# Patient Record
Sex: Female | Born: 1960
Health system: Southern US, Community
[De-identification: ages and names within clinical notes are randomized; demographics above are authoritative.]

## PROBLEM LIST (undated history)

## (undated) DIAGNOSIS — E785 Hyperlipidemia, unspecified: Secondary | ICD-10-CM

## (undated) DIAGNOSIS — T7840XA Allergy, unspecified, initial encounter: Secondary | ICD-10-CM

## (undated) DIAGNOSIS — Z833 Family history of diabetes mellitus: Secondary | ICD-10-CM

## (undated) DIAGNOSIS — R7989 Other specified abnormal findings of blood chemistry: Secondary | ICD-10-CM

## (undated) DIAGNOSIS — R011 Cardiac murmur, unspecified: Secondary | ICD-10-CM

## (undated) DIAGNOSIS — I10 Essential (primary) hypertension: Secondary | ICD-10-CM

## (undated) DIAGNOSIS — K219 Gastro-esophageal reflux disease without esophagitis: Secondary | ICD-10-CM

## (undated) DIAGNOSIS — K222 Esophageal obstruction: Secondary | ICD-10-CM

## (undated) DIAGNOSIS — M3500 Sicca syndrome, unspecified: Secondary | ICD-10-CM

## (undated) DIAGNOSIS — K589 Irritable bowel syndrome without diarrhea: Secondary | ICD-10-CM

## (undated) DIAGNOSIS — M1990 Unspecified osteoarthritis, unspecified site: Secondary | ICD-10-CM

## (undated) DIAGNOSIS — I7300 Raynaud's syndrome without gangrene: Secondary | ICD-10-CM

## (undated) DIAGNOSIS — F419 Anxiety disorder, unspecified: Secondary | ICD-10-CM

## (undated) DIAGNOSIS — R51 Headache: Secondary | ICD-10-CM

## (undated) DIAGNOSIS — C189 Malignant neoplasm of colon, unspecified: Secondary | ICD-10-CM

## (undated) DIAGNOSIS — E039 Hypothyroidism, unspecified: Secondary | ICD-10-CM

## (undated) DIAGNOSIS — N809 Endometriosis, unspecified: Secondary | ICD-10-CM

## (undated) HISTORY — PX: COLONOSCOPY: SHX174

## (undated) HISTORY — DX: Malignant neoplasm of colon, unspecified: C18.9

## (undated) HISTORY — PX: UPPER GASTROINTESTINAL ENDOSCOPY: SHX188

## (undated) HISTORY — DX: Irritable bowel syndrome, unspecified: K58.9

## (undated) HISTORY — DX: Essential (primary) hypertension: I10

## (undated) HISTORY — DX: Hypothyroidism, unspecified: E03.9

## (undated) HISTORY — DX: Family history of diabetes mellitus: Z83.3

## (undated) HISTORY — PX: HERNIA REPAIR: SHX51

## (undated) HISTORY — DX: Sjogren syndrome, unspecified: M35.00

## (undated) HISTORY — DX: Headache: R51

## (undated) HISTORY — DX: Endometriosis, unspecified: N80.9

## (undated) HISTORY — DX: Other specified abnormal findings of blood chemistry: R79.89

## (undated) HISTORY — PX: ABDOMINAL HYSTERECTOMY: SHX81

## (undated) HISTORY — PX: REDUCTION MAMMAPLASTY: SUR839

## (undated) HISTORY — PX: BREAST BIOPSY: SHX20

## (undated) HISTORY — PX: POLYPECTOMY: SHX149

## (undated) HISTORY — DX: Gastro-esophageal reflux disease without esophagitis: K21.9

## (undated) HISTORY — DX: Hyperlipidemia, unspecified: E78.5

## (undated) HISTORY — DX: Unspecified osteoarthritis, unspecified site: M19.90

## (undated) HISTORY — DX: Anxiety disorder, unspecified: F41.9

## (undated) HISTORY — DX: Allergy, unspecified, initial encounter: T78.40XA

## (undated) HISTORY — DX: Cardiac murmur, unspecified: R01.1

## (undated) HISTORY — DX: Raynaud's syndrome without gangrene: I73.00

## (undated) HISTORY — PX: BREAST REDUCTION SURGERY: SHX8

## (undated) HISTORY — DX: Esophageal obstruction: K22.2

---

## 1996-02-04 ENCOUNTER — Encounter: Payer: Self-pay | Admitting: Internal Medicine

## 1998-10-05 ENCOUNTER — Other Ambulatory Visit: Admission: RE | Admit: 1998-10-05 | Discharge: 1998-10-05 | Payer: Self-pay | Admitting: *Deleted

## 1998-10-15 ENCOUNTER — Ambulatory Visit (HOSPITAL_COMMUNITY): Admission: RE | Admit: 1998-10-15 | Discharge: 1998-10-15 | Payer: Self-pay | Admitting: Obstetrics and Gynecology

## 1998-10-15 ENCOUNTER — Encounter: Payer: Self-pay | Admitting: Obstetrics and Gynecology

## 1999-12-28 ENCOUNTER — Ambulatory Visit (HOSPITAL_BASED_OUTPATIENT_CLINIC_OR_DEPARTMENT_OTHER): Admission: RE | Admit: 1999-12-28 | Discharge: 1999-12-28 | Payer: Self-pay | Admitting: *Deleted

## 1999-12-28 ENCOUNTER — Encounter (INDEPENDENT_AMBULATORY_CARE_PROVIDER_SITE_OTHER): Payer: Self-pay | Admitting: *Deleted

## 2000-11-13 ENCOUNTER — Other Ambulatory Visit: Admission: RE | Admit: 2000-11-13 | Discharge: 2000-11-13 | Payer: Self-pay | Admitting: *Deleted

## 2001-05-16 ENCOUNTER — Encounter: Payer: Self-pay | Admitting: Internal Medicine

## 2001-05-16 ENCOUNTER — Ambulatory Visit (HOSPITAL_COMMUNITY): Admission: RE | Admit: 2001-05-16 | Discharge: 2001-05-16 | Payer: Self-pay | Admitting: Internal Medicine

## 2003-03-24 ENCOUNTER — Other Ambulatory Visit: Admission: RE | Admit: 2003-03-24 | Discharge: 2003-03-24 | Payer: Self-pay | Admitting: Obstetrics & Gynecology

## 2003-09-09 ENCOUNTER — Ambulatory Visit (HOSPITAL_COMMUNITY): Admission: RE | Admit: 2003-09-09 | Discharge: 2003-09-09 | Payer: Self-pay | Admitting: *Deleted

## 2003-09-09 ENCOUNTER — Encounter (INDEPENDENT_AMBULATORY_CARE_PROVIDER_SITE_OTHER): Payer: Self-pay | Admitting: Specialist

## 2003-09-09 ENCOUNTER — Ambulatory Visit (HOSPITAL_BASED_OUTPATIENT_CLINIC_OR_DEPARTMENT_OTHER): Admission: RE | Admit: 2003-09-09 | Discharge: 2003-09-09 | Payer: Self-pay | Admitting: *Deleted

## 2006-02-01 ENCOUNTER — Ambulatory Visit: Payer: Self-pay | Admitting: Internal Medicine

## 2006-02-09 ENCOUNTER — Ambulatory Visit: Payer: Self-pay | Admitting: Internal Medicine

## 2006-03-02 ENCOUNTER — Ambulatory Visit: Payer: Self-pay | Admitting: Internal Medicine

## 2006-05-17 ENCOUNTER — Ambulatory Visit: Payer: Self-pay | Admitting: Internal Medicine

## 2006-06-02 ENCOUNTER — Ambulatory Visit: Payer: Self-pay | Admitting: Internal Medicine

## 2006-07-04 ENCOUNTER — Ambulatory Visit: Payer: Self-pay | Admitting: Internal Medicine

## 2006-08-04 ENCOUNTER — Ambulatory Visit: Payer: Self-pay | Admitting: Internal Medicine

## 2006-11-27 ENCOUNTER — Ambulatory Visit: Payer: Self-pay | Admitting: Internal Medicine

## 2006-11-27 LAB — CONVERTED CEMR LAB
BUN: 4 mg/dL — ABNORMAL LOW (ref 6–23)
Basophils Absolute: 0 10*3/uL (ref 0.0–0.1)
Basophils Relative: 0 % (ref 0.0–1.0)
Creatinine, Ser: 0.7 mg/dL (ref 0.4–1.2)
Eosinophils Absolute: 0.1 10*3/uL (ref 0.0–0.6)
Eosinophils Relative: 2.6 % (ref 0.0–5.0)
HCT: 43.1 % (ref 36.0–46.0)
Hemoglobin: 14.6 g/dL (ref 12.0–15.0)
Lymphocytes Relative: 47 % — ABNORMAL HIGH (ref 12.0–46.0)
MCHC: 33.9 g/dL (ref 30.0–36.0)
MCV: 93 fL (ref 78.0–100.0)
Monocytes Absolute: 0.3 10*3/uL (ref 0.2–0.7)
Monocytes Relative: 8.2 % (ref 3.0–11.0)
Neutro Abs: 1.5 10*3/uL (ref 1.4–7.7)
Neutrophils Relative %: 42.2 % — ABNORMAL LOW (ref 43.0–77.0)
Platelets: 312 10*3/uL (ref 150–400)
Potassium: 3.9 meq/L (ref 3.5–5.1)
RBC: 4.63 M/uL (ref 3.87–5.11)
RDW: 12.4 % (ref 11.5–14.6)
WBC: 3.6 10*3/uL — ABNORMAL LOW (ref 4.5–10.5)

## 2006-11-28 ENCOUNTER — Encounter: Payer: Self-pay | Admitting: Internal Medicine

## 2006-11-28 LAB — CONVERTED CEMR LAB: Cyclic Citrullin Peptide Ab: 4 units (ref <20)

## 2007-02-19 ENCOUNTER — Ambulatory Visit: Payer: Self-pay | Admitting: Internal Medicine

## 2007-02-20 LAB — CONVERTED CEMR LAB
Free T4: 0.6 ng/dL (ref 0.6–1.6)
T3, Free: 3 pg/mL (ref 2.3–4.2)
TSH: 7.02 microintl units/mL — ABNORMAL HIGH (ref 0.35–5.50)

## 2007-03-14 ENCOUNTER — Ambulatory Visit: Payer: Self-pay | Admitting: Internal Medicine

## 2007-03-14 ENCOUNTER — Telehealth: Payer: Self-pay | Admitting: Internal Medicine

## 2007-03-30 ENCOUNTER — Ambulatory Visit: Payer: Self-pay | Admitting: Internal Medicine

## 2007-03-30 ENCOUNTER — Encounter: Payer: Self-pay | Admitting: Internal Medicine

## 2007-04-03 DIAGNOSIS — E039 Hypothyroidism, unspecified: Secondary | ICD-10-CM | POA: Insufficient documentation

## 2007-04-13 DIAGNOSIS — E785 Hyperlipidemia, unspecified: Secondary | ICD-10-CM | POA: Insufficient documentation

## 2007-04-13 DIAGNOSIS — R519 Headache, unspecified: Secondary | ICD-10-CM | POA: Insufficient documentation

## 2007-04-13 DIAGNOSIS — R51 Headache: Secondary | ICD-10-CM | POA: Insufficient documentation

## 2007-04-13 DIAGNOSIS — E7800 Pure hypercholesterolemia, unspecified: Secondary | ICD-10-CM | POA: Insufficient documentation

## 2007-04-13 DIAGNOSIS — I7300 Raynaud's syndrome without gangrene: Secondary | ICD-10-CM | POA: Insufficient documentation

## 2007-04-13 DIAGNOSIS — M797 Fibromyalgia: Secondary | ICD-10-CM | POA: Insufficient documentation

## 2007-04-13 DIAGNOSIS — M1990 Unspecified osteoarthritis, unspecified site: Secondary | ICD-10-CM | POA: Insufficient documentation

## 2007-04-16 ENCOUNTER — Ambulatory Visit: Payer: Self-pay | Admitting: Internal Medicine

## 2007-04-16 DIAGNOSIS — N951 Menopausal and female climacteric states: Secondary | ICD-10-CM | POA: Insufficient documentation

## 2007-04-16 DIAGNOSIS — S335XXA Sprain of ligaments of lumbar spine, initial encounter: Secondary | ICD-10-CM | POA: Insufficient documentation

## 2007-04-16 LAB — CONVERTED CEMR LAB: TSH: 0.19 microintl units/mL — ABNORMAL LOW (ref 0.35–5.50)

## 2007-05-24 ENCOUNTER — Encounter: Payer: Self-pay | Admitting: Internal Medicine

## 2007-06-11 ENCOUNTER — Telehealth: Payer: Self-pay | Admitting: Internal Medicine

## 2008-05-28 ENCOUNTER — Telehealth: Payer: Self-pay | Admitting: Internal Medicine

## 2008-10-25 ENCOUNTER — Encounter: Payer: Self-pay | Admitting: Internal Medicine

## 2008-10-30 ENCOUNTER — Encounter: Payer: Self-pay | Admitting: Internal Medicine

## 2008-10-31 ENCOUNTER — Encounter: Payer: Self-pay | Admitting: Internal Medicine

## 2008-12-22 ENCOUNTER — Ambulatory Visit: Payer: Self-pay | Admitting: Internal Medicine

## 2008-12-22 DIAGNOSIS — R635 Abnormal weight gain: Secondary | ICD-10-CM | POA: Insufficient documentation

## 2008-12-24 LAB — CONVERTED CEMR LAB
Free T4: 0.7 ng/dL (ref 0.6–1.6)
T3, Free: 2.9 pg/mL (ref 2.3–4.2)
TSH: 1.65 microintl units/mL (ref 0.35–5.50)

## 2008-12-26 ENCOUNTER — Telehealth (INDEPENDENT_AMBULATORY_CARE_PROVIDER_SITE_OTHER): Payer: Self-pay | Admitting: *Deleted

## 2009-02-06 ENCOUNTER — Telehealth: Payer: Self-pay | Admitting: Internal Medicine

## 2009-02-24 ENCOUNTER — Ambulatory Visit: Payer: Self-pay | Admitting: Internal Medicine

## 2009-02-24 DIAGNOSIS — D510 Vitamin B12 deficiency anemia due to intrinsic factor deficiency: Secondary | ICD-10-CM | POA: Insufficient documentation

## 2009-02-25 ENCOUNTER — Telehealth: Payer: Self-pay | Admitting: Internal Medicine

## 2009-04-24 ENCOUNTER — Ambulatory Visit: Payer: Self-pay | Admitting: Internal Medicine

## 2009-04-24 LAB — CONVERTED CEMR LAB
Cholesterol, target level: 200 mg/dL
HDL goal, serum: 40 mg/dL
LDL Goal: 160 mg/dL

## 2009-04-30 ENCOUNTER — Encounter: Payer: Self-pay | Admitting: Internal Medicine

## 2009-07-20 ENCOUNTER — Encounter: Payer: Self-pay | Admitting: Nurse Practitioner

## 2009-07-22 ENCOUNTER — Telehealth: Payer: Self-pay | Admitting: Internal Medicine

## 2009-07-23 ENCOUNTER — Ambulatory Visit: Payer: Self-pay | Admitting: Gastroenterology

## 2009-07-23 DIAGNOSIS — R109 Unspecified abdominal pain: Secondary | ICD-10-CM | POA: Insufficient documentation

## 2009-07-23 DIAGNOSIS — R1084 Generalized abdominal pain: Secondary | ICD-10-CM | POA: Insufficient documentation

## 2009-07-23 DIAGNOSIS — R198 Other specified symptoms and signs involving the digestive system and abdomen: Secondary | ICD-10-CM | POA: Insufficient documentation

## 2009-07-23 DIAGNOSIS — D709 Neutropenia, unspecified: Secondary | ICD-10-CM | POA: Insufficient documentation

## 2009-07-23 DIAGNOSIS — R110 Nausea: Secondary | ICD-10-CM | POA: Insufficient documentation

## 2009-07-23 DIAGNOSIS — R12 Heartburn: Secondary | ICD-10-CM | POA: Insufficient documentation

## 2009-07-27 LAB — CONVERTED CEMR LAB
Basophils Absolute: 0 10*3/uL (ref 0.0–0.1)
Lymphocytes Relative: 47.2 % — ABNORMAL HIGH (ref 12.0–46.0)
Monocytes Relative: 10.9 % (ref 3.0–12.0)
Neutrophils Relative %: 39.8 % — ABNORMAL LOW (ref 43.0–77.0)
Platelets: 263 10*3/uL (ref 150.0–400.0)
RDW: 12.4 % (ref 11.5–14.6)

## 2009-08-19 DIAGNOSIS — N809 Endometriosis, unspecified: Secondary | ICD-10-CM | POA: Insufficient documentation

## 2009-08-19 HISTORY — DX: Endometriosis, unspecified: N80.9

## 2009-08-24 ENCOUNTER — Ambulatory Visit (HOSPITAL_COMMUNITY): Admission: RE | Admit: 2009-08-24 | Discharge: 2009-08-24 | Payer: Self-pay | Admitting: Internal Medicine

## 2009-08-24 ENCOUNTER — Ambulatory Visit: Payer: Self-pay | Admitting: Internal Medicine

## 2009-08-24 DIAGNOSIS — R1013 Epigastric pain: Secondary | ICD-10-CM

## 2009-08-24 DIAGNOSIS — K3189 Other diseases of stomach and duodenum: Secondary | ICD-10-CM | POA: Insufficient documentation

## 2009-09-09 ENCOUNTER — Encounter (INDEPENDENT_AMBULATORY_CARE_PROVIDER_SITE_OTHER): Payer: Self-pay | Admitting: *Deleted

## 2009-10-08 ENCOUNTER — Telehealth: Payer: Self-pay | Admitting: Internal Medicine

## 2009-10-13 ENCOUNTER — Telehealth: Payer: Self-pay | Admitting: Internal Medicine

## 2009-10-20 ENCOUNTER — Ambulatory Visit: Payer: Self-pay | Admitting: Internal Medicine

## 2009-10-26 ENCOUNTER — Telehealth: Payer: Self-pay | Admitting: Internal Medicine

## 2009-11-03 ENCOUNTER — Ambulatory Visit: Payer: Self-pay | Admitting: Internal Medicine

## 2009-11-04 ENCOUNTER — Encounter: Payer: Self-pay | Admitting: Internal Medicine

## 2009-11-05 ENCOUNTER — Encounter: Payer: Self-pay | Admitting: Internal Medicine

## 2009-12-04 ENCOUNTER — Telehealth: Payer: Self-pay | Admitting: Internal Medicine

## 2010-01-18 ENCOUNTER — Ambulatory Visit: Payer: Self-pay | Admitting: Internal Medicine

## 2010-01-18 DIAGNOSIS — R030 Elevated blood-pressure reading, without diagnosis of hypertension: Secondary | ICD-10-CM | POA: Insufficient documentation

## 2010-03-31 ENCOUNTER — Telehealth: Payer: Self-pay | Admitting: Internal Medicine

## 2010-04-02 ENCOUNTER — Telehealth: Payer: Self-pay | Admitting: Internal Medicine

## 2010-04-14 ENCOUNTER — Telehealth: Payer: Self-pay | Admitting: Internal Medicine

## 2010-06-03 ENCOUNTER — Ambulatory Visit: Payer: Self-pay | Admitting: Internal Medicine

## 2010-08-25 ENCOUNTER — Ambulatory Visit: Payer: Self-pay | Admitting: Internal Medicine

## 2010-08-25 LAB — CONVERTED CEMR LAB
ALT: 17 units/L (ref 0–35)
AST: 18 units/L (ref 0–37)
Albumin: 3.6 g/dL (ref 3.5–5.2)
Alkaline Phosphatase: 75 units/L (ref 39–117)
Basophils Absolute: 0 10*3/uL (ref 0.0–0.1)
Basophils Relative: 0.4 % (ref 0.0–3.0)
Calcium: 9 mg/dL (ref 8.4–10.5)
Eosinophils Relative: 1.1 % (ref 0.0–5.0)
GFR calc non Af Amer: 93.14 mL/min (ref >60.00)
HDL: 44.8 mg/dL (ref >39.00)
Hemoglobin: 14.1 g/dL (ref 12.0–15.0)
Ketones, ur: NEGATIVE mg/dL
Lymphocytes Relative: 41.3 % (ref 12.0–46.0)
Monocytes Relative: 9.7 % (ref 3.0–12.0)
Neutro Abs: 1.8 10*3/uL (ref 1.4–7.7)
RBC: 4.41 M/uL (ref 3.87–5.11)
Sodium: 140 meq/L (ref 135–145)
Specific Gravity, Urine: 1.025 (ref 1.000–1.030)
Total CHOL/HDL Ratio: 4
Total Protein: 7.2 g/dL (ref 6.0–8.3)
Urine Glucose: NEGATIVE mg/dL
Urobilinogen, UA: 0.2 (ref 0.0–1.0)
VLDL: 14.6 mg/dL (ref 0.0–40.0)
WBC: 3.8 10*3/uL — ABNORMAL LOW (ref 4.5–10.5)

## 2010-08-30 ENCOUNTER — Ambulatory Visit: Payer: Self-pay | Admitting: Internal Medicine

## 2010-09-17 ENCOUNTER — Telehealth: Payer: Self-pay | Admitting: Internal Medicine

## 2010-09-29 ENCOUNTER — Ambulatory Visit
Admission: RE | Admit: 2010-09-29 | Discharge: 2010-09-29 | Payer: Self-pay | Source: Home / Self Care | Attending: Internal Medicine | Admitting: Internal Medicine

## 2010-09-29 DIAGNOSIS — K8020 Calculus of gallbladder without cholecystitis without obstruction: Secondary | ICD-10-CM | POA: Insufficient documentation

## 2010-09-29 DIAGNOSIS — K222 Esophageal obstruction: Secondary | ICD-10-CM | POA: Insufficient documentation

## 2010-09-29 DIAGNOSIS — R197 Diarrhea, unspecified: Secondary | ICD-10-CM | POA: Insufficient documentation

## 2010-09-29 DIAGNOSIS — F411 Generalized anxiety disorder: Secondary | ICD-10-CM | POA: Insufficient documentation

## 2010-10-12 NOTE — Progress Notes (Signed)
Summary: requesting preventative rx  Phone Note Call from Patient Call back at Home Phone [phone]   Caller: Patient-   voicemail Reason for Call: Acute Illness Summary of Call: Had 5 migraines in the last 2 weeks. On Imitrex. She is requesting a preventative rx from getting these weather related migraines. her pharmacy is  [facility] [address]. Initial call taken by: Warnell Forester,  October 08, 2009 9:48 AM  Follow-up for Phone Call        per dr Lovell Sheehan- needs ov- left message on machine for pt to call and make ov Follow-up by: Willy Eddy, LPN,  October 08, 2009 9:59 AM

## 2010-10-12 NOTE — Procedures (Signed)
Summary: Upper Endoscopy  Patient: [name] Note: All result statuses are Final unless otherwise noted.  Tests: (1) Upper Endoscopy (EGD)   EGD Upper Endoscopy       DONE     [facility]     [address]     [place], [place]  [address]           ENDOSCOPY PROCEDURE REPORT           PATIENT:  Margaret Green, Margaret Green  MR#:  [id number]     BIRTHDATE:  1961/06/23, 48 yrs. old  GENDER:  female           ENDOSCOPIST:  Hedwig Morton. Juanda Chance, MD     Referred by:  Stacie Glaze, M.D.           PROCEDURE DATE:  11/03/2009     PROCEDURE:  EGD with biopsy     ASA CLASS:  Class I     INDICATIONS:  abdominal pain           MEDICATIONS:   Versed 8 mg, Fentanyl 50 mcg     TOPICAL ANESTHETIC:  Exactacain Spray           DESCRIPTION OF PROCEDURE:   After the risks benefits and     alternatives of the procedure were thoroughly explained, informed     consent was obtained.  The [facility] [id number] endoscope was     introduced through the mouth and advanced to the second portion of     the duodenum, without limitations.  The instrument was slowly     withdrawn as the mucosa was fully examined.     <<PROCEDUREIMAGES>>           Duodenitis was found. r/o H.pylori, Multiple biopsies were     obtained and sent to pathology (see image4 and image5).  Otherwise     the examination was normal (see image8, image7, image6, and     image1).    Retroflexed views revealed no abnormalities.    The     scope was then withdrawn from the patient and the procedure     completed.           COMPLICATIONS:  None           ENDOSCOPIC IMPRESSION:     1) Duodenitis     2) Otherwise normal examination     small duodenal ulceration,/erosion, r/o H.pylori     RECOMMENDATIONS:     1) await pathology results     cont Dexilant 60 mg po qd     minimize Diclofenac           REPEAT EXAM:  In 0 year(s) for.           ______________________________     Hedwig Morton. Juanda Chance, MD           CC:           n.  eSIGNED:   Hedwig Morton. Brodie at 11/03/2009 03:33 PM           Mikea, Quadros, [id number]  Note: An exclamation mark (!) indicates a result that was not dispersed into the flowsheet. Document Creation Date: 11/03/2009 3:34 PM _______________________________________________________________________  (1) Order result status: Final Collection or observation date-time: 11/03/2009 15:25 Requested date-time:  Receipt date-time:  Reported date-time:  Referring Physician:   Ordering Physician: Lina Sar [phone]) Specimen Source:  Source: Launa Grill Order Number: [id number] Lab site:

## 2010-10-12 NOTE — Progress Notes (Signed)
Summary: samples  Medications Added ACIPHEX 20 MG TBEC (RABEPRAZOLE SODIUM) Take 1 tablet by mouth once a day DEXILANT 60 MG CPDR (DEXLANSOPRAZOLE) Take 1 tablet by mouth once a day       Phone Note Call from Patient Call back at Home Phone [phone]   Caller: Patient Call For: Juanda Chance Reason for Call: Talk to Nurse Summary of Call: Patient would like samples of Dexilant, states that Nexium did not work and Aciphex did work but before they pay for Aciphex they want her to try Dexilant Initial call taken by: Tawni Levy,  October 13, 2009 4:47 PM  Follow-up for Phone Call        Patient states that aciphex worked quite well for her but her insurance will not cover the rx until she has tried Nexium AND dexilant. She has tried nexium without results but has not tried dexilant. She requested samples. I have advised her that I will give her samples, however, she MUST reschedule her endoscopy and KEEP the appointment for any further samples or refills (she has cancelled EGD x 2). Patient has rescheduled and verbalizes understanding that we will not give anymore samples or rx's until she has her procedure. Follow-up by: Hortense Ramal CMA Duncan Dull),  October 14, 2009 8:58 AM    New/Updated Medications: ACIPHEX 20 MG TBEC (RABEPRAZOLE SODIUM) Take 1 tablet by mouth once a day DEXILANT 60 MG CPDR (DEXLANSOPRAZOLE) Take 1 tablet by mouth once a day

## 2010-10-12 NOTE — Progress Notes (Signed)
Summary: rx topamax  Phone Note Call from Patient   Caller: Patient Call For: Stacie Glaze MD Reason for Call: Talk to Nurse Summary of Call: having migraines 2x wkly - had discussed topamax in the past - would like to try if ok'd by Dr. Lovell Sheehan - has appt end of aug.  if ok'd - [facility] [address] for rx. can be reached at [phone] Initial call taken by: Duard Brady LPN,  April 02, 2010 1:08 PM  Follow-up for Phone Call        per dr [email or web address] may start topamax 25 for 4 days ,then 50 for 8 days then 75 and then make ov to see dr Lovell Sheehan are being on topamax 75 for 2 weeks Follow-up by: Willy Eddy, LPN,  April 02, 2010 1:43 PM    New/Updated Medications: TOPAMAX 25 MG TABS (TOPIRAMATE) 1 for 4 days then topamax 50 for 8 days then topamax 75 once daily TOPAMAX 50 MG TABS (TOPIRAMATE) 1 once daily for 8 days then 1 25 and 1 50(total of 75 ) once daily Prescriptions: TOPAMAX 50 MG TABS (TOPIRAMATE) 1 once daily for 8 days then 1 25 and 1 50(total of 75 ) once daily  #38 x 1   Entered by:   Willy Eddy, LPN   Authorized by:   Stacie Glaze MD   Signed by:   Willy Eddy, LPN on 11/91/4782   Method used:   Electronically to        [facility]. [phone]* (retail)       [address]       [place], [place]  [address]       Ph: [phone]       Fax: [phone]   RxID:   [id number] TOPAMAX 25 MG TABS (TOPIRAMATE) 1 for 4 days then topamax 50 for 8 days then topamax 75 once daily  #34 x 1   Entered by:   Willy Eddy, LPN   Authorized by:   Stacie Glaze MD   Signed by:   Willy Eddy, LPN on 95/63/8756   Method used:   Electronically to        [facility]. [phone]* (retail)       [address]       [place], [place]  [address]       Ph: [phone]       Fax: [phone]   RxID:   [id number]

## 2010-10-12 NOTE — Progress Notes (Signed)
Summary: congested  Phone Note Call from Patient   Caller: Patient Call For: Stacie Glaze MD Summary of Call: C/o head congestion, headache and cough. Finished Zpak. Can she have a decongestant? [facility] . Initial call taken by: Raechel Ache, RN,  October 26, 2009 12:10 PM  Follow-up for Phone Call        may  have zyrtec d 1 once daily per dr Lovell Sheehan  Follow-up by: Willy Eddy, LPN,  October 26, 2009 2:00 PM    New/Updated Medications: ZYRTEC-D ALLERGY & CONGESTION 5-120 MG XR12H-TAB (CETIRIZINE-PSEUDOEPHEDRINE) 1 q am for congestion Prescriptions: ZYRTEC-D ALLERGY & CONGESTION 5-120 MG XR12H-TAB (CETIRIZINE-PSEUDOEPHEDRINE) 1 q am for congestion  #30 x 0   Entered by:   Raechel Ache, RN   Authorized by:   Stacie Glaze MD   Signed by:   Raechel Ache, RN on 10/26/2009   Method used:   Electronically to        [facility]. [phone]* (retail)       [address]       [place], [place]  [address]       Ph: [phone]       Fax: [phone]   RxID:   [id number]

## 2010-10-12 NOTE — Progress Notes (Signed)
Summary: requesting alternate rx  Phone Note Call from Patient Call back at Home Phone [phone]   Caller: Patient--live call Summary of Call: Was put on Topamax. Makes her sick. would like to try alternate. Please call [facility] [address]. Initial call taken by: Warnell Forester,  April 14, 2010 10:46 AM  Follow-up for Phone Call        stop the topamax and may try verapamil 40 three times a day and call back with report of how she after she has been on it for 2-3 weeks per dr Lovell Sheehan    New/Updated Medications: VERAPAMIL HCL 40 MG TABS (VERAPAMIL HCL) One by mouth tid Prescriptions: VERAPAMIL HCL 40 MG TABS (VERAPAMIL HCL) One by mouth tid  #90 x 5   Entered by:   Rudy Jew, RN   Authorized by:   Stacie Glaze MD   Signed by:   Rudy Jew, RN on 04/14/2010   Method used:   Electronically to        [facility]. [phone]* (retail)       [address]       [place], [place]  [address]       Ph: [phone]       Fax: [phone]   RxID:   [id number]

## 2010-10-12 NOTE — Progress Notes (Signed)
Summary: labs & suvella concern  Phone Note Call from Patient Call back at Home Phone [phone] Call back at Work Phone [phone]   Caller: vm Summary of Call: Requesting copy of labs for health fair comparison.  Also Suvella working great, but am experiencing BP problems 138/90 & 140/92.  Never had before.  Continue or stop? Initial call taken by: Rudy Jew, RN,  December 04, 2009 11:17 AM  Follow-up for Phone Call        left message on machine per dr Lovell Sheehan- not suvella- dr Lovell Sheehan suggests loosing weight would help with bp Follow-up by: Willy Eddy, LPN,  December 04, 2009 12:46 PM

## 2010-10-12 NOTE — Letter (Signed)
Summary: Patient Notice-Endo Biopsy Results  [facility]  [address] [place], [place] [address]   Phone: [phone]  Fax: [phone]        November 05, 2009 MRN: [id number]    [facility] [address] [place], [place]  [address]    Dear Margaret Green,  I am pleased to inform you that the biopsies taken during your recent endoscopic examination did not show any evidence of cancer upon pathologic examination.The biopsies from the duodenum show inflammation consistent with duodenitis  Additional information/recommendations:  __No further action is needed at this time.  Please follow-up with      your primary care physician for your other healthcare needs.  __ Please call [phone] to schedule a return visit to review      your condition.  _x_ Continue with the treatment plan as outlined on the day of your      exam.  __ You should have a repeat endoscopic examination for this problem              in _ months/years.   Please call us if you are having persistent problems or have questions about your condition that have not been fully answered at this time.  Sincerely,  Hart Carwin MD  This letter has been electronically signed by your physician.  Appended Document: Patient Notice-Endo Biopsy Results Letter mailed 2.25.11

## 2010-10-12 NOTE — Miscellaneous (Signed)
Summary: Aciphex Rx  Pharmacy will put prescription through and will send [place] a request for prior authorization if needed. Hortense Ramal CMA Duncan Dull)  November 03, 2009 4:08 PM  Clinical Lists Changes  Medications: Changed medication from DEXILANT 60 MG CPDR (DEXLANSOPRAZOLE) Take 1 tablet by mouth once a day to ACIPHEX 20 MG TBEC (RABEPRAZOLE SODIUM) Take 1 tablet by mouth once daily - Signed Rx of ACIPHEX 20 MG TBEC (RABEPRAZOLE SODIUM) Take 1 tablet by mouth once daily;  #30 x 5;  Signed;  Entered by: Hortense Ramal CMA (AAMA);  Authorized by: Hart Carwin MD;  Method used: Telephoned to [facility]. [id number]*, [address], [place], [place], [place]  [address], Ph: [phone], Fax: [phone]    Prescriptions: ACIPHEX 20 MG TBEC (RABEPRAZOLE SODIUM) Take 1 tablet by mouth once daily  #30 x 5   Entered by:   Hortense Ramal CMA (AAMA)   Authorized by:   Hart Carwin MD   Signed by:   Hortense Ramal CMA (AAMA) on 11/03/2009   Method used:   Telephoned to ...       [facility]  [address] [phone]* (retail)       [address]       [place], [place]  [address]       Ph: [phone]       Fax: [phone]   RxID:   [id number]

## 2010-10-12 NOTE — Medication Information (Signed)
Summary: Approved/[employer]  Approved/[employer]IG[employer]   Imported By: Lester  11/09/2009 09:53:31  _____________________________________________________________________  External Attachment:    Type:   Image     Comment:   External Document

## 2010-10-12 NOTE — Assessment & Plan Note (Signed)
Summary: 2 month rov/njr/pt rescd//ccm/pt rsc/cjr   Vital Signs:  Patient profile:   50 year old female Height:      65 inches Weight:      156 pounds BMI:     26.05 Temp:     98.2 degrees F oral Pulse rate:   80 / minute Resp:     14 per minute BP sitting:   130 / 80  (left arm)  Vitals Entered By: Willy Eddy, LPN (Jan 18, 1609 4:38 PM) CC: roa, Lipid Management   Primary Care Provider:  Darryll Capers, MD  CC:  roa and Lipid Management.  History of Present Illness: blood pressure is good mood is stable head ache patter is stable and has only had two since she went on the savella fibromyalgia and insomnia responsive to the medication  noted elevated bod pressure at a health fair at work and has montered at home the blood preesu was slightly p at home but in the office the blood pressure is normal  Lipid Management History:      Negative NCEP/ATP III risk factors include female age less than 69 years old, no history of early menopause without estrogen hormone replacement, and non-tobacco-user status.     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) 10)  Weight Gain, Abnormal  (ICD-783.1) 11)  Sprain/strain, Lumbar Region  (ICD-847.2) 12)  Menopause  (ICD-627.2) 13)  Fibromyalgia  (ICD-729.1) 14)  Raynaud's Disease  (ICD-443.0) 15)  Headache  (ICD-784.0) 16)  Osteoarthritis  (ICD-715.90) 17)  Hyperlipidemia  (ICD-272.4) 18)  Family History Diabetes 1st Degree Relative  (ICD-V18.0) 19)  Hypothyroidism  (ICD-244.9)  Current Problems (verified): 1)  Dyspepsia   (ICD-536.8) 2)  Hx of Endometriosis  (ICD-617.9) 3)  Heartburn  (ICD-787.1) 4)  Neutropenia Unspecified  (ICD-288.00) 5)  Nausea  (ICD-787.02) 6)  Abdominal Pain-multiple Sites  (ICD-789.09) 7)  Abdominal Pain, Generalized  (ICD-789.07) 8)  Change in Bowels  (ICD-787.99) 9)  Anemia, 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 Take 1 Tablet By Mouth Once A Day 2)  Xanax 0.25 Mg Tabs (Alprazolam) .... One By Mouth Three Times A Day Prn 3)  Flonase 50 Mcg/act  Susp (Fluticasone Propionate) .... As Directed 4)  Calcium 500/d 500-400 Mg-Unit  Chew (Calcium-Vitamin D) .... 3 Tabs A Day 5)  Imitrex 50 Mg  Tabs (Sumatriptan Succinate) .... As Needed 6)  Estrace 1 Mg  Tabs (Estradiol) .... Once Daily 7)  Zanaflex 4 Mg  Tabs (Tizanidine Hcl) .... One By Mouth Two Times A Day As Needed. 8)  Lipitor 20 Mg Tab (Atorvastatin Calcium) .... Take 1 Tablet By Mouth Each Evening 9)  Butalbital-Apap-Caffeine 50-325-40 Mg  Caps (Butalbital-Apap-Caffeine) .Marland KitchenMarland KitchenMarland Kitchen  1 Q 4 Hrs As Needed 10)  Sumatriptan Succinate 100 Mg  Tabs (Sumatriptan Succinate) .... As Needed 11)  Zolpidem Tartrate 10 Mg Tabs (Zolpidem Tartrate) .Marland Kitchen.. 1 At Bedtime As Needed 12)  Promethazine Hcl 25 Mg Tabs (Promethazine Hcl) .Marland Kitchen.. 1 Every 6-8 Hours As Needed Nausea 13)  Valtrex 1 Gm Tabs (Valacyclovir Hcl) .... Take 1 Tablet By Mouth Three Times A Day X 7 Days 14)  Aciphex 20 Mg Tbec (Rabeprazole Sodium) .... Take 1 Tablet By Mouth Once Daily 15)  Savella 50 Mg Tabs (Milnacipran Hcl) .... One By Mouth Two Times A  Day 16)  Azithromycin 250 Mg Tabs (Azithromycin) .... Two By Mouth Now Then One By Mouth Daily For 4 Days 17)  Zyrtec-D Allergy & Congestion 5-120 Mg Xr12h-Tab (Cetirizine-Pseudoephedrine) .Marland Kitchen.. 1 Q Am For Congestion  Current Medications (verified): 1)  Synthroid 100 Mcg Tabs (Levothyroxine Sodium) .... Take 1 Tablet By Mouth Once A Day 2)  Xanax 0.25 Mg Tabs (Alprazolam) .... One By Mouth Three Times A Day Prn 3)  Flonase 50 Mcg/act  Susp (Fluticasone Propionate) .... As Directed 4)  Calcium 500/d 500-400 Mg-Unit  Chew (Calcium-Vitamin D) .... 3 Tabs A Day 5)  Imitrex 50 Mg  Tabs (Sumatriptan Succinate) .... As Needed 6)  Zanaflex 4 Mg  Tabs (Tizanidine Hcl) .... One By Mouth Two Times A Day As Needed. 7)  Lipitor 20 Mg Tab (Atorvastatin Calcium) .... Take 1 Tablet By Mouth Each Evening 8)  Butalbital-Apap-Caffeine 50-325-40 Mg  Caps (Butalbital-Apap-Caffeine) .Marland Kitchen.. 1 Q 4 Hrs As Needed 9)  Sumatriptan Succinate 100 Mg  Tabs (Sumatriptan Succinate) .... As Needed 10)  Zolpidem Tartrate 10 Mg Tabs (Zolpidem Tartrate) .Marland Kitchen.. 1 At Bedtime As Needed 11)  Promethazine Hcl 25 Mg Tabs (Promethazine Hcl) .Marland Kitchen.. 1 Every 6-8 Hours As Needed Nausea 12)  Aciphex 20 Mg Tbec (Rabeprazole Sodium) .... Take 1 Tablet By Mouth Once Daily 13)  Savella 50 Mg Tabs (Milnacipran Hcl) .... One By Mouth Two Times A Day 14)  Zyrtec-D Allergy & Congestion 5-120 Mg Xr12h-Tab (Cetirizine-Pseudoephedrine) .Marland Kitchen.. 1 Q Am For Congestion 15)  Natural Estrogen Cream  Allergies (verified): 1)  ! Codeine 2)  Mobic (Meloxicam) 3)  Percodan (Oxycodone-Aspirin) 4)  Sulfamethoxazole (Sulfamethoxazole)  Past History:  Family History: Last updated: 08/24/2009 Family History Diabetes 1st degree relative Fam hx CAD Fam hx MI Family History of Ovarian Cancer:Aunt Family History of Stomach Cancer:Aunt Family History of Heart Disease: Father  Family History of Cirrhosis: Mother No FH of Colon Cancer:  Social History: Last updated:  08/19/2009 Never Smoked Alcohol use-yes-occasional Drug use-no Occupation: Sales Daily Caffeine Use Illicit Drug Use - no  Risk Factors: Smoking Status: never (01/18/2010)  Past medical, surgical, family and social histories (including risk factors) reviewed, and no changes noted (except as noted below).  Past Medical History: Reviewed history from 07/23/2009 and no changes required. Hypothyroidism Hyperlipidemia Osteoarthritis Headache 443.0 RAYNAUDS DX 729.1 FIBROMYALGIA Anxiety Disorder Esophageal Stricture  Past Surgical History: Reviewed history from 08/19/2009 and no changes required. TAH + O Elevated lipis Hernia Surgery Breast Biopsy-left breast Laparoscopy for endometriosis  Family History: Reviewed history from 08/24/2009 and no changes required. Family History Diabetes 1st degree relative Fam hx CAD Fam hx MI Family History of Ovarian Cancer:Aunt Family History of Stomach Cancer:Aunt Family History of Heart Disease: Father  Family History of Cirrhosis: Mother No FH of Colon Cancer:  Social History: Reviewed history from 08/19/2009 and no changes required. Never Smoked Alcohol use-yes-occasional Drug use-no Occupation:  Sales Daily Caffeine Use Illicit Drug Use - no  Review of Systems  The patient denies anorexia, fever, weight loss, weight gain, vision loss, decreased hearing, hoarseness, chest pain, syncope, dyspnea on exertion, peripheral edema, prolonged cough, headaches, hemoptysis, abdominal pain, melena, hematochezia, severe indigestion/heartburn, hematuria, incontinence, genital sores, muscle weakness, suspicious skin lesions, transient blindness, difficulty walking, depression, unusual weight change, abnormal bleeding, enlarged lymph nodes, angioedema, and breast masses.    Physical Exam  General:  alert and overweight-appearing.   Head:  normocephalic.  normocephalic.   Nose:  nasal dischargemucosal pallor, mucosal erythema, and mucosal  edema.   Mouth:  pharynx pink and moist.   Neck:  supple.   Lungs:  normal respiratory effort, no intercostal retractions, no accessory muscle use, and normal breath sounds.   Heart:  normal rate, regular rhythm, and no murmur.   Abdomen:  soft, no distention, and no masses.   Msk:  No deformity or scoliosis noted of thoracic or lumbar spine.   Extremities:  trace left pedal edema and trace right pedal edema.   Neurologic:  No cranial nerve deficits noted. Station and gait are normal. Plantar reflexes are down-going bilaterally. DTRs are symmetrical throughout. Sensory, motor and coordinative functions appear intact.   Impression & Recommendations:  Problem # 1:  FIBROMYALGIA (ICD-729.1)  Her updated medication list for this problem includes:    Zanaflex 4 Mg Tabs (Tizanidine hcl) ..... One by mouth two times a day as needed.    Butalbital-apap-caffeine 50-325-40 Mg Caps (Butalbital-apap-caffeine) .Marland Kitchen... 1 q 4 hrs as needed  Problem # 2:  HEADACHE (ICD-784.0) better Her updated medication list for this problem includes:    Imitrex 50 Mg Tabs (Sumatriptan succinate) .Marland Kitchen... As needed    Butalbital-apap-caffeine 50-325-40 Mg Caps (Butalbital-apap-caffeine) .Marland Kitchen... 1 q 4 hrs as needed    Sumatriptan Succinate 100 Mg Tabs (Sumatriptan succinate) .Marland Kitchen... As needed  Problem # 3:  ELEVATED BLOOD PRESSURE WITHOUT DIAGNOSIS OF HYPERTENSION (ICD-796.2) moniter and weight loss  Complete Medication List: 1)  Synthroid 100 Mcg Tabs (Levothyroxine sodium) .... Take 1 tablet by mouth once a day 2)  Xanax 0.25 Mg Tabs (Alprazolam) .... One by mouth three times a day prn 3)  Flonase 50 Mcg/act Susp (Fluticasone propionate) .... As directed 4)  Calcium 500/d 500-400 Mg-unit Chew (Calcium-vitamin d) .... 3 tabs a day 5)  Imitrex 50 Mg Tabs (Sumatriptan succinate) .... As needed 6)  Zanaflex 4 Mg Tabs (Tizanidine hcl) .... One by mouth two times a day as needed. 7)  Lipitor 20 Mg Tab (Atorvastatin calcium)  .... Take 1 tablet by mouth each evening 8)  Butalbital-apap-caffeine 50-325-40 Mg Caps (Butalbital-apap-caffeine) .Marland Kitchen.. 1 q 4 hrs as needed 9)  Sumatriptan Succinate 100 Mg Tabs (Sumatriptan succinate) .... As needed 10)  Zolpidem Tartrate 10 Mg Tabs (Zolpidem tartrate) .Marland Kitchen.. 1 at bedtime as needed 11)  Promethazine Hcl 25 Mg Tabs (Promethazine hcl) .Marland Kitchen.. 1 every 6-8 hours as needed nausea 12)  Aciphex 20 Mg Tbec (Rabeprazole sodium) .... Take 1 tablet by mouth once daily 13)  Savella 50 Mg Tabs (Milnacipran hcl) .... One by mouth two times a day 14)  Zyrtec-d Allergy & Congestion 5-120 Mg Xr12h-tab (Cetirizine-pseudoephedrine) .Marland Kitchen.. 1 q am for congestion 15)  Natural Estrogen Cream   Other Orders: Vit B12 1000 mcg (J3420) Admin of Therapeutic Inj  intramuscular or subcutaneous (47829)  Lipid Assessment/Plan:      Based on NCEP/ATP III, the patient's risk factor category is "0-1 risk factors".  The  patient's lipid goals are as follows: Total cholesterol goal is 200; LDL cholesterol goal is 160; HDL cholesterol goal is 40; Triglyceride goal is 150.    Patient Instructions: 1)  Please schedule a follow-up appointment in 3 months.   Medication Administration  Injection # 1:    Medication: Vit B12 1000 mcg    Diagnosis: ANEMIA, PERNICIOUS (ICD-281.0)    Site: L deltoid    Exp Date: 06/11/2011    Lot #: [id number]    Mfr: [employer]    Patient tolerated injection without complications    Given by: Willy Eddy, LPN (Jan 18, 1609 4:42 PM)  Orders Added: 1)  Vit B12 1000 mcg [J3420] 2)  Admin of Therapeutic Inj  intramuscular or subcutaneous [96372] 3)  Est. Patient Level V [96045]

## 2010-10-12 NOTE — Assessment & Plan Note (Signed)
Summary: 3 month rov/njr rsc bmp/njr/pt rescd//ccm pt rsc/njr   Vital Signs:  Patient profile:   50 year old female Height:      65 inches Weight:      154 pounds BMI:     25.72 Temp:     98.6 degrees F oral Pulse rate:   76 / minute Resp:     14 per minute BP sitting:   130 / 80  (left arm)  Vitals Entered By: Willy Eddy, LPN (June 03, 2010 9:25 AM) CC: roa, Lipid Management Is Patient Diabetic? No   CC:  roa and Lipid Management.  Lipid Management History:      Negative NCEP/ATP III risk factors include female age less than 34 years old, no history of early menopause without estrogen hormone replacement, non-tobacco-user status, non-hypertensive, no ASHD (atherosclerotic heart disease), no prior stroke/TIA, no peripheral vascular disease, and no history of aortic aneurysm.     Preventive Screening-Counseling & Management  Alcohol-Tobacco     Smoking Status: never  Current Problems (verified): 1)  Elevated Blood Pressure Without Diagnosis of Hypertension  (ICD-796.2) 2)  Dyspepsia  (ICD-536.8) 3)  Hx of Endometriosis  (ICD-617.9) 4)  Heartburn  (ICD-787.1) 5)  Neutropenia Unspecified  (ICD-288.00) 6)  Nausea  (ICD-787.02) 7)  Abdominal Pain-multiple Sites  (ICD-789.09) 8)  Abdominal Pain, Generalized  (ICD-789.07) 9)  Change in Bowels  (ICD-787.99) 10)  Anemia, Pernicious  (ICD-281.0) 11)  Weight Gain, Abnormal  (ICD-783.1) 12)  Sprain/strain, Lumbar Region  (ICD-847.2) 13)  Menopause  (ICD-627.2) 14)  Fibromyalgia  (ICD-729.1) 15)  Raynaud's Disease  (ICD-443.0) 16)  Headache  (ICD-784.0) 17)  Osteoarthritis  (ICD-715.90) 18)  Hyperlipidemia  (ICD-272.4) 19)  Family History Diabetes 1st Degree Relative  (ICD-V18.0) 20)  Hypothyroidism  (ICD-244.9)  Current Medications (verified): 1)  Synthroid 100 Mcg Tabs (Levothyroxine Sodium) .... Take 1 Tablet By Mouth Once A Day 2)  Xanax 0.25 Mg Tabs (Alprazolam) .... One By Mouth Three Times A Day Prn 3)   Flonase 50 Mcg/act  Susp (Fluticasone Propionate) .... As Directed 4)  Calcium 500/d 500-400 Mg-Unit  Chew (Calcium-Vitamin D) .... 3 Tabs A Day 5)  Imitrex 50 Mg  Tabs (Sumatriptan Succinate) .... As Needed 6)  Zanaflex 4 Mg  Tabs (Tizanidine Hcl) .... One By Mouth Two Times A Day As Needed. 7)  Lipitor 20 Mg Tab (Atorvastatin Calcium) .... Take 1 Tablet By Mouth Each Evening 8)  Butalbital-Apap-Caffeine 50-325-40 Mg  Caps (Butalbital-Apap-Caffeine) .Marland Kitchen.. 1 Q 4 Hrs As Needed 9)  Sumatriptan Succinate 100 Mg  Tabs (Sumatriptan Succinate) .... As Needed 10)  Zolpidem Tartrate 10 Mg Tabs (Zolpidem Tartrate) .Marland Kitchen.. 1 At Bedtime As Needed 11)  Promethazine Hcl 25 Mg Tabs (Promethazine Hcl) .Marland Kitchen.. 1 Every 6-8 Hours As Needed Nausea 12)  Aciphex 20 Mg Tbec (Rabeprazole Sodium) .... Take 1 Tablet By Mouth Once Daily 13)  Savella 50 Mg Tabs (Milnacipran Hcl) .... One By Mouth Two Times A Day 14)  Zyrtec-D Allergy & Congestion 5-120 Mg Xr12h-Tab (Cetirizine-Pseudoephedrine) .Marland Kitchen.. 1 Q Am For Congestion 15)  Natural Estrogen Cream 16)  Verapamil Hcl 40 Mg Tabs (Verapamil Hcl) .... One By Mouth Tid 17)  Phentermine Hcl 37.5 Mg Tabs (Phentermine Hcl) .Marland Kitchen.. 1 Once Daily  Allergies (verified): 1)  ! 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  Past Medical History: Reviewed history from 07/23/2009 and no changes required. Hypothyroidism Hyperlipidemia Osteoarthritis Headache 443.0 RAYNAUDS DX 729.1 FIBROMYALGIA Anxiety Disorder Esophageal Stricture  Past Surgical History: Reviewed history from 08/19/2009 and no changes required. TAH + O Elevated lipis Hernia Surgery Breast Biopsy-left breast Laparoscopy for endometriosis  Family History: Reviewed history from 08/24/2009 and no changes required. Family History Diabetes 1st degree relative Fam hx CAD Fam hx MI Family History of Ovarian Cancer:Aunt Family History of Stomach Cancer:Aunt Family History of Heart Disease: Father  Family History of Cirrhosis: Mother No FH of Colon Cancer:  Social History: Reviewed history from 08/19/2009 and no changes required. 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         Flu Vaccine Consent Questions     Do you have a history of severe allergic reactions to this vaccine? no    Any prior history of allergic reactions to egg and/or gelatin? no    Do you have a sensitivity to the preservative Thimersol? no    Do you have a past history of Guillan-Barre Syndrome? no    Do you currently have an acute febrile illness? no    Have you ever had a severe reaction to latex? no    Vaccine  information given and explained to patient? yes    Are you currently pregnant? no    Lot Number:[id number]   Exp Date:03/12/2011   Site Given  Left Deltoid IM   Physical Exam  General:  alert and overweight-appearing.   Head:  normocephalic.  normocephalic.   Ears:  R ear normal and no external deformities.   Nose:  nasal dischargemucosal pallor, mucosal erythema, and mucosal edema.   Mouth:  pharynx pink and moist.   Neck:  supple.   Lungs:  normal respiratory effort, no intercostal retractions, no accessory muscle use, and normal breath sounds.   Heart:  normal rate, regular rhythm, and no murmur.   Abdomen:  soft, no distention, and no masses.   Msk:  No deformity or scoliosis noted of thoracic or lumbar spine.   Extremities:  trace left pedal edema and trace right pedal edema.   Neurologic:  No cranial nerve deficits noted. Station and gait are normal. Plantar reflexes are down-going bilaterally. DTRs are symmetrical throughout. Sensory, motor and coordinative functions appear intact. Psych:  flat affect, subdued, withdrawn, and poor eye contact.  flat affect, subdued, withdrawn, and poor eye contact.     Impression & Recommendations:  Problem # 1:  ELEVATED BLOOD PRESSURE WITHOUT DIAGNOSIS OF HYPERTENSION (ICD-796.2) Assessment Unchanged  Her updated medication list for this problem includes:  Verapamil Hcl 40 Mg Tabs (Verapamil hcl) ..... One by mouth tid  BP today: 130/80 Prior BP: 130/80 (01/18/2010)  Prior 10 Yr Risk Heart Disease: Not enough information (04/24/2009)  Labs Reviewed: Creat: 0.7 (11/27/2006)  Instructed in low sodium diet (DASH Handout) and behavior modification.    Problem # 2:  DYSPEPSIA (ICD-536.8) the pts heart burn is controlled with the aciphex  Problem # 3:  FIBROMYALGIA (ICD-729.1) rates functioning as fair and point tenderness Her updated medication list for this problem includes:    Zanaflex 4 Mg Tabs (Tizanidine hcl) ..... One by  mouth two times a day as needed.    Butalbital-apap-caffeine 50-325-40 Mg Caps (Butalbital-apap-caffeine) .Marland Kitchen... 1 q 4 hrs as needed  Problem # 4:  HEADACHE (ICD-784.0) her cluster head ache patter has stopped with the verapamil Her updated medication list for this problem includes:    Imitrex 50 Mg Tabs (Sumatriptan succinate) .Marland Kitchen... As needed    Butalbital-apap-caffeine 50-325-40 Mg Caps (Butalbital-apap-caffeine) .Marland Kitchen... 1 q 4 hrs as needed    Sumatriptan Succinate 100 Mg Tabs (Sumatriptan succinate) .Marland Kitchen... As needed  Headache diary reviewed.  Complete Medication List: 1)  Synthroid 100 Mcg Tabs (Levothyroxine sodium) .... Take 1 tablet by mouth once a day 2)  Xanax 0.25 Mg Tabs (Alprazolam) .... One by mouth three times a day prn 3)  Flonase 50 Mcg/act Susp (Fluticasone propionate) .... As directed 4)  Calcium 500/d 500-400 Mg-unit Chew (Calcium-vitamin d) .... 3 tabs a day 5)  Imitrex 50 Mg Tabs (Sumatriptan succinate) .... As needed 6)  Zanaflex 4 Mg Tabs (Tizanidine hcl) .... One by mouth two times a day as needed. 7)  Lipitor 20 Mg Tab (Atorvastatin calcium) .... Take 1 tablet by mouth each evening 8)  Butalbital-apap-caffeine 50-325-40 Mg Caps (Butalbital-apap-caffeine) .Marland Kitchen.. 1 q 4 hrs as needed 9)  Sumatriptan Succinate 100 Mg Tabs (Sumatriptan succinate) .... As needed 10)  Zolpidem Tartrate 10 Mg Tabs (Zolpidem tartrate) .Marland Kitchen.. 1 at bedtime as needed 11)  Promethazine Hcl 25 Mg Tabs (Promethazine hcl) .Marland Kitchen.. 1 every 6-8 hours as needed nausea 12)  Aciphex 20 Mg Tbec (Rabeprazole sodium) .... Take 1 tablet by mouth once daily 13)  Savella 50 Mg Tabs (Milnacipran hcl) .... One by mouth two times a day 14)  Zyrtec-d Allergy & Congestion 5-120 Mg Xr12h-tab (Cetirizine-pseudoephedrine) .Marland Kitchen.. 1 q am for congestion 15)  Natural Estrogen Cream  16)  Verapamil Hcl 40 Mg Tabs (Verapamil hcl) .... One by mouth tid 17)  Phentermine Hcl 37.5 Mg Tabs (Phentermine hcl) .Marland Kitchen.. 1 once daily  Other  Orders: Admin 1st Vaccine (01027) Flu Vaccine 30yrs + (25366)  Lipid Assessment/Plan:      Based on NCEP/ATP III, the patient's risk factor category is "0-1 risk factors".  The patient's lipid goals are as follows: Total cholesterol goal is 200; LDL cholesterol goal is 160; HDL cholesterol goal is 40; Triglyceride goal is 150.    Patient Instructions: 1)  CPX in 3 months Prescriptions: LIPITOR 20 MG TAB (ATORVASTATIN CALCIUM) Take 1 tablet by mouth each evening  #30 x 11   Entered and Authorized by:   Stacie Glaze MD   Signed by:   Stacie Glaze MD on 06/03/2010   Method used:   Electronically to        [facility]. #[id number]* (retail)       [address] [facility] [address]       [place], [place]  [address]       Ph: [phone]       Fax: [phone]   RxID:   [id number]

## 2010-10-12 NOTE — Assessment & Plan Note (Signed)
Summary: FU ON MIGRAINE/NJR   Vital Signs:  Patient profile:   50 year old female Height:      65 inches Weight:      158 pounds BMI:     26.39 Temp:     98.2 degrees F oral Pulse rate:   88 / minute Resp:     14 per minute BP sitting:   134 / 80  (left arm)  Vitals Entered By: Willy Eddy, LPN (October 20, 2009 3:05 PM) CC: roa headache-didnt get diclofenac filled- c/o uri type sx, Headache   Primary Care Provider:  Darryll Capers, MD  CC:  roa headache-didnt get diclofenac filled- c/o uri type sx and Headache.  History of Present Illness: accelerated migraine pattern increased Head aches at night  see migraine hx acute URI with head and ear congestion and runney nose increased fibromyalgia   Gallstones and EGD with Juanda Chance reviewed with pt  Headache HPI (Reviewed):      The patient comes in for chronic management of headaches which have been unstable.  Since the last visit, the frequency of headaches have increased, and the intensity of the headaches have increased.  The headaches generally will last anywhere from 15 minutes to 2 hours at a time.  She has approximately 5+ headaches per month.  The patient is right handed.  There is a family history of migraine headaches.        The location of the headaches are unilateral-right.  Headache quality is throbbing or pulsating.  Precipitating factors consist of odors and changes in weather.  The headaches are associated with nausea and photophobia.        Positive alarm features include change in frequency from prior H/A's and change in severity from prior H/A's.         Preventive Screening-Counseling & Management  Alcohol-Tobacco     Smoking Status: never  Current Problems (verified): 1)  Dyspepsia  (ICD-536.8) 2)  Hx of Endometriosis  (ICD-617.9) 3)  Heartburn  (ICD-787.1) 4)  Neutropenia Unspecified  (ICD-288.00) 5)  Nausea  (ICD-787.02) 6)  Abdominal Pain-multiple Sites  (ICD-789.09) 7)  Abdominal Pain,  Generalized  (ICD-789.07) 8)  Change in Bowels  (ICD-787.99) 9)  Anemia, Pernicious  (ICD-281.0) 10)  Weight Gain, Abnormal  (ICD-783.1) 11)  Sprain/strain, Lumbar Region  (ICD-847.2) 12)  Menopause  (ICD-627.2) 13)  Fibromyalgia  (ICD-729.1) 14)  Raynaud's Disease  (ICD-443.0) 15)  Headache  (ICD-784.0) 16)  Osteoarthritis  (ICD-715.90) 17)  Hyperlipidemia  (ICD-272.4) 18)  Family History Diabetes 1st Degree Relative  (ICD-V18.0) 19)  Hypothyroidism  (ICD-244.9)  Current Medications (verified): 1)  Synthroid 100 Mcg Tabs (Levothyroxine Sodium) .... Take 1 Tablet By Mouth Once A Day 2)  Xanax 0.25 Mg Tabs (Alprazolam) .... One By Mouth Three Times A Day Prn 3)  Flonase 50 Mcg/act  Susp (Fluticasone Propionate) .... As Directed 4)  Calcium 500/d 500-400 Mg-Unit  Chew (Calcium-Vitamin D) .... 3 Tabs A Day 5)  Imitrex 50 Mg  Tabs (Sumatriptan Succinate) .... As Needed 6)  Estrace 1 Mg  Tabs (Estradiol) .... Once Daily 7)  Zanaflex 4 Mg  Tabs (Tizanidine Hcl) .... One By Mouth Two Times A Day As Needed. 8)  Lipitor 20 Mg Tab (Atorvastatin Calcium) .... Take 1 Tablet By Mouth Each Evening 9)  Butalbital-Apap-Caffeine 50-325-40 Mg  Caps (Butalbital-Apap-Caffeine) .Marland Kitchen.. 1 Q 4 Hrs As Needed 10)  Sumatriptan Succinate 100 Mg  Tabs (Sumatriptan Succinate) .... As Needed 11)  Pristiq 50 Mg  Xr24h-Tab (Desvenlafaxine Succinate) .... One By Mouth Daily 12)  Zolpidem Tartrate 10 Mg Tabs (Zolpidem Tartrate) .Marland Kitchen.. 1 At Bedtime As Needed 13)  Promethazine Hcl 25 Mg Tabs (Promethazine Hcl) .Marland Kitchen.. 1 Every 6-8 Hours As Needed Nausea 14)  Diclofenac Sodium 75 Mg Tbec (Diclofenac Sodium) .... Two Times A Day For Pain As Needed 15)  Prevacid 30 Mg Cpdr (Lansoprazole) .... Take 1 Tablet By Mouth Once A Day 16)  Valtrex 1 Gm Tabs (Valacyclovir Hcl) .... Take 1 Tablet By Mouth Three Times A Day X 7 Days 17)  Aciphex 20 Mg Tbec (Rabeprazole Sodium) .... Take 1 Tablet By Mouth Once A Day 18)  Dexilant 60 Mg Cpdr  (Dexlansoprazole) .... Take 1 Tablet By Mouth Once A Day  Allergies (verified): 1)  ! 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  Past Medical History: Reviewed history from 07/23/2009 and no changes required. Hypothyroidism Hyperlipidemia Osteoarthritis Headache 443.0 RAYNAUDS DX 729.1 FIBROMYALGIA Anxiety Disorder Esophageal Stricture  Past Surgical History: Reviewed history from 08/19/2009 and no changes required. TAH + O Elevated lipis Hernia Surgery Breast Biopsy-left breast Laparoscopy for endometriosis  Family History: Reviewed history from 08/24/2009 and no changes required. Family History Diabetes 1st degree relative Fam hx CAD Fam hx MI Family History of Ovarian Cancer:Aunt Family History of Stomach Cancer:Aunt Family History of Heart Disease: Father  Family History of Cirrhosis: Mother No FH of Colon Cancer:  Social History: Reviewed history from 08/19/2009 and no changes required. 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    Physical Exam  General:  alert and overweight-appearing.   Head:  normocephalic.  normocephalic.   Ears:  R ear normal and no external deformities.   Nose:  nasal dischargemucosal pallor, mucosal erythema, and mucosal edema.   Mouth:  pharynx pink and moist.   Neck:  supple.   Lungs:  normal respiratory effort, no intercostal retractions, no accessory muscle use, and normal breath sounds.   Heart:  normal rate, regular rhythm, and no murmur.   Abdomen:  soft, no distention, and no masses.     Impression & Recommendations:  Problem # 1:  FIBROMYALGIA (ICD-729.1) savella trial with titration over 2 weeks and foolw up in 4 months The following medications were removed from the medication list:    Diclofenac Sodium 75 Mg Tbec (Diclofenac sodium) .Marland Kitchen..Marland Kitchen Two times a day for pain as needed Her updated medication list for this problem includes:    Zanaflex 4 Mg Tabs (Tizanidine  hcl) ..... One by mouth two times a day as needed.    Butalbital-apap-caffeine 50-325-40 Mg Caps (Butalbital-apap-caffeine) .Marland Kitchen... 1 q 4 hrs as needed  Problem # 2:  HEADACHE (ICD-784.0)  The following medications were removed from the medication list:    Diclofenac Sodium 75 Mg Tbec (Diclofenac sodium) .Marland Kitchen..Marland Kitchen Two times a day for pain as needed Her updated medication list for this problem includes:    Imitrex 50 Mg Tabs (Sumatriptan succinate) .Marland Kitchen... As needed    Butalbital-apap-caffeine 50-325-40 Mg Caps (Butalbital-apap-caffeine) .Marland Kitchen... 1 q 4 hrs as needed    Sumatriptan Succinate 100 Mg Tabs (Sumatriptan succinate) .Marland Kitchen... As needed  Headache diary  reviewed.  Problem # 3:  ANEMIA, PERNICIOUS (ICD-281.0) b12 shots today Hgb: 13.4 (07/23/2009)   Hct: 39.1 (07/23/2009)   Platelets: 263.0 (07/23/2009) RBC: 4.16 (07/23/2009)   RDW: 12.4 (07/23/2009)   WBC: 3.7 (07/23/2009) MCV: 94.2 (07/23/2009)   MCHC: 34.3 (07/23/2009) TSH: 1.65 (12/22/2008)  Complete Medication List: 1)  Synthroid 100 Mcg Tabs (Levothyroxine sodium) .... Take 1 tablet by mouth once a day 2)  Xanax 0.25 Mg Tabs (Alprazolam) .... One by mouth three times a day prn 3)  Flonase 50 Mcg/act Susp (Fluticasone propionate) .... As directed 4)  Calcium 500/d 500-400 Mg-unit Chew (Calcium-vitamin d) .... 3 tabs a day 5)  Imitrex 50 Mg Tabs (Sumatriptan succinate) .... As needed 6)  Estrace 1 Mg Tabs (Estradiol) .... Once daily 7)  Zanaflex 4 Mg Tabs (Tizanidine hcl) .... One by mouth two times a day as needed. 8)  Lipitor 20 Mg Tab (Atorvastatin calcium) .... Take 1 tablet by mouth each evening 9)  Butalbital-apap-caffeine 50-325-40 Mg Caps (Butalbital-apap-caffeine) .Marland Kitchen.. 1 q 4 hrs as needed 10)  Sumatriptan Succinate 100 Mg Tabs (Sumatriptan succinate) .... As needed 11)  Zolpidem Tartrate 10 Mg Tabs (Zolpidem tartrate) .Marland Kitchen.. 1 at bedtime as needed 12)  Promethazine Hcl 25 Mg Tabs (Promethazine hcl) .Marland Kitchen.. 1 every 6-8 hours as needed nausea 13)  Valtrex 1 Gm Tabs (Valacyclovir hcl) .... Take 1 tablet by mouth three times a day x 7 days 14)  Dexilant 60 Mg Cpdr (Dexlansoprazole) .... Take 1 tablet by mouth once a day 15)  Savella 50 Mg Tabs (Milnacipran hcl) .... One by mouth two times a day 16)  Azithromycin 250 Mg Tabs (Azithromycin) .... Two by mouth now then one by mouth daily for 4 days  Patient Instructions: 1)  Please schedule a follow-up appointment in 2 months. Prescriptions: AZITHROMYCIN 250 MG TABS (AZITHROMYCIN) two by mouth now then one by mouth daily for 4 days  #6 x 0   Entered and Authorized by:   Stacie Glaze MD   Signed by:   Stacie Glaze MD on  10/20/2009   Method used:   Electronically to        [facility] [phone]* (retail)       [address]       [place], [place]  [address]       Ph: [phone]       Fax: [phone]   RxID:   [id number] SAVELLA 50 MG TABS (MILNACIPRAN HCL) one by mouth two times a day  #60 x 11   Entered and Authorized by:   Stacie Glaze MD   Signed by:   Stacie Glaze MD on 10/20/2009   Method used:   Electronically to        [facility]  [address] [phone]* (retail)       [address]       [place], [place]  [address]       Ph: [phone]       Fax: [phone]   RxID:   [id number] DICLOFENAC SODIUM 75 MG TBEC (DICLOFENAC SODIUM) two times a day for pain as needed  #60 x 1   Entered by:   Willy Eddy, LPN   Authorized by:   Stacie Glaze MD   Signed by:   Willy Eddy, LPN on 44/09/270   Method used:   Electronically to        [facility]. [phone]* (retail)       [address]       [place], [place]  [address]       Ph: [phone]       Fax: [phone]   RxID:   [id number]   Appended Document: Orders Update     Clinical Lists Changes  Orders: Added new Service order of Vit B12 1000 mcg ([id number]) - Signed Added new Service order of Admin of Therapeutic Inj  intramuscular or subcutaneous (22025) - Signed       Medication Administration  Injection # 1:    Medication: Vit B12 1000 mcg    Diagnosis: ANEMIA, PERNICIOUS (ICD-281.0)    Route: IM    Site: L deltoid    Exp Date: 06/11/2011    Lot #: [id number]    Mfr: [employer]    Patient tolerated injection without complications    Given by: Willy Eddy, LPN (October 20, 2009 4:07 PM)  Orders Added: 1)  Vit B12 1000 mcg [J3420] 2)  Admin of Therapeutic Inj  intramuscular or subcutaneous [[id number]]

## 2010-10-12 NOTE — Progress Notes (Signed)
Summary: prior auth   Phone Note Call from Patient Call back at Home Phone [phone]   Caller: Patient Call For: Margaret Green Reason for Call: Talk to Nurse Summary of Call: Patient needs a prior auth for her Aciphex, states that she just had it approved but her insurance changed. Initial call taken by: Tawni Levy,  March 31, 2010 3:48 PM  Follow-up for Phone Call        I have left a message for patient that she will need to have her pharmacy send [place] a "kick back" from her insurance company as that has the appropriate number we will need to call for her particular insurance plan. I have no other way of knowing what number to call unless she provides it for me. Left message that I will be glad to do the authorization right away as soon as I get that information. Patient to call back with any questions. Follow-up by: Lamona Curl CMA Duncan Dull),  March 31, 2010 4:30 PM

## 2010-10-14 NOTE — Assessment & Plan Note (Signed)
Summary: DIARRHEA..JJ.    History of Present Illness Visit Type: Follow-up Visit Primary GI MD: Lina Sar MD Primary Provider: Darryll Capers, MD Requesting Provider: n/a Chief Complaint: Severe abd cramping, diarrhea, and nausea History of Present Illness:   This is a 50 year old white female with diarrheal stools of 4 weeks duration. The diarrhea started shortly after she took 10 days of Keflex 500 mg 4 times a day following a tooth extraction. She has not had any normal formed stools. She has crampy lower abdominal pain but no fever or blood. We have seen her for duodenitis in 2011 and put her on AcipHex 20 mg daily which she takes p.r.n. with good results. There has been questionable esophageal dysmotility on a prior evaluation in 1997 when she presented with Sjgren's syndrome and mild abnormality of esophageal manometry. Her barium swallow in 2002 showed normal motility. Her last colonoscopy in July 2008 for left lower quadrant abdominal pain showed an essentially normal colon without any evidence of diverticulosis. An abdominal ultrasound in December 2010 showed small gallstones in an otherwise normal-appearing gallbladder. She has a strong family history of bladder disease in her father and his 3 sisters.   GI Review of Systems    Reports nausea.      Denies abdominal pain, acid reflux, belching, bloating, chest pain, dysphagia with liquids, dysphagia with solids, heartburn, loss of appetite, vomiting, vomiting blood, weight loss, and  weight gain.      Reports diarrhea.     Denies anal fissure, black tarry stools, change in bowel habit, constipation, diverticulosis, fecal incontinence, heme positive stool, hemorrhoids, irritable bowel syndrome, jaundice, light color stool, liver problems, rectal bleeding, and  rectal pain.    Current Medications (verified): 1)  Synthroid 100 Mcg Tabs (Levothyroxine Sodium) .... Take 1 Tablet By Mouth Once A Day 2)  Xanax 0.25 Mg Tabs (Alprazolam)  .... One By Mouth Three Times A Day Prn 3)  Flonase 50 Mcg/act  Susp (Fluticasone Propionate) .... As Directed 4)  Calcium 500/d 500-400 Mg-Unit  Chew (Calcium-Vitamin D) .... 3 Tabs A Day 5)  Imitrex 50 Mg  Tabs (Sumatriptan Succinate) .... As Needed 6)  Zanaflex 4 Mg  Tabs (Tizanidine Hcl) .... One By Mouth Two Times A Day As Needed. 7)  Lipitor 20 Mg Tab (Atorvastatin Calcium) .... Take 1 Tablet By Mouth Each Evening 8)  Butalbital-Apap-Caffeine 50-325-40 Mg  Caps (Butalbital-Apap-Caffeine) .Marland Kitchen.. 1 Q 4 Hrs As Needed 9)  Sumatriptan Succinate 100 Mg  Tabs (Sumatriptan Succinate) .... As Needed 10)  Zolpidem Tartrate 10 Mg Tabs (Zolpidem Tartrate) .Marland Kitchen.. 1 At Bedtime As Needed 11)  Promethazine Hcl 25 Mg Tabs (Promethazine Hcl) .Marland Kitchen.. 1 Every 6-8 Hours As Needed Nausea 12)  Aciphex 20 Mg Tbec (Rabeprazole Sodium) .... Take 1 Tablet By Mouth Once Daily 13)  Savella 50 Mg Tabs (Milnacipran Hcl) .... One By Mouth Two Times A Day 14)  Zyrtec-D Allergy & Congestion 5-120 Mg Xr12h-Tab (Cetirizine-Pseudoephedrine) .Marland Kitchen.. 1 Q Am For Congestion 15)  Estrace Patch .... As Directed 16)  Verapamil Hcl 40 Mg Tabs (Verapamil Hcl) .... One By Mouth Tid 17)  Phentermine Hcl 37.5 Mg Tabs (Phentermine Hcl) .Marland Kitchen.. 1 Once Daily 18)  Lomotil 2.5-0.025 Mg Tabs (Diphenoxylate-Atropine) .... As Needed  Allergies (verified): 1)  ! Codeine 2)  Mobic (Meloxicam) 3)  Percodan 4)  Sulfamethoxazole (Sulfamethoxazole)  Past History:  Past Medical History: DIARRHEA (ICD-787.91) ANXIETY (ICD-300.00) ESOPHAGEAL STRICTURE (ICD-530.3) ELEVATED BLOOD PRESSURE WITHOUT DIAGNOSIS OF HYPERTENSION (ICD-796.2) DYSPEPSIA (ICD-536.8) Hx  of ENDOMETRIOSIS (ICD-617.9) HEARTBURN (ICD-787.1) NEUTROPENIA UNSPECIFIED (ICD-288.00) NAUSEA (ICD-787.02) ABDOMINAL PAIN-MULTIPLE SITES (ICD-789.09) ABDOMINAL PAIN, GENERALIZED (ICD-789.07) CHANGE IN BOWELS (ICD-787.99) ANEMIA, PERNICIOUS (ICD-281.0) WEIGHT GAIN, ABNORMAL  (ICD-783.1) SPRAIN/STRAIN, LUMBAR REGION (ICD-847.2) MENOPAUSE (ICD-627.2) FIBROMYALGIA (ICD-729.1) RAYNAUD'S DISEASE (ICD-443.0) HEADACHE (ICD-784.0) OSTEOARTHRITIS (ICD-715.90) HYPERLIPIDEMIA (ICD-272.4) FAMILY HISTORY DIABETES 1ST DEGREE RELATIVE (ICD-V18.0) HYPOTHYROIDISM (ICD-244.9)  Past Surgical History: Reviewed history from 08/19/2009 and no changes required. TAH + O Elevated lipis Hernia Surgery Breast Biopsy-left breast Laparoscopy for endometriosis  Family History: Reviewed history from 08/24/2009 and no changes required. Family History Diabetes 1st degree relative Fam hx CAD Fam hx MI Family History of Ovarian Cancer:Aunt Family History of Stomach Cancer:Aunt Family History of Heart Disease: Father  Family History of Cirrhosis: Mother No FH of Colon Cancer:  Social History: Occupation: Airline pilot  Never Smoked Alcohol use-yes-occasional Drug use-no Daily Caffeine Use Illicit Drug Use - no  Review of Systems       The patient complains of back pain, fatigue, and headaches-new.  The patient denies allergy/sinus, anemia, anxiety-new, arthritis/joint pain, blood in urine, breast changes/lumps, change in vision, confusion, cough, coughing up blood, depression-new, fainting, fever, hearing problems, heart murmur, heart rhythm changes, itching, menstrual pain, muscle pains/cramps, night sweats, nosebleeds, pregnancy symptoms, shortness of breath, skin rash, sleeping problems, sore throat, swelling of feet/legs, swollen lymph glands, thirst - excessive , urination - excessive , urination changes/pain, urine leakage, vision changes, and voice change.         Pertinent positive and negative review of systems were noted in the above HPI. All other ROS was otherwise negative.   Vital Signs:  Patient profile:   50 year old female Height:      65 inches Weight:      154 pounds BMI:     25.72 BSA:     1.77 Pulse rate:   88 / minute Pulse rhythm:   regular BP sitting:    124 / 76  (left arm) Cuff size:   regular  Vitals Entered By: Ok Anis CMA (September 29, 2010 11:02 AM)  Physical Exam  General:  Well developed, well nourished, no acute distress. Mouth:  No deformity or lesions, dentition normal. Neck:  Supple; no masses or thyromegaly. Lungs:  Clear throughout to auscultation. Heart:  Regular rate and rhythm; no murmurs, rubs,  or bruits. Abdomen:  soft abdomen with normoactive bowel sounds. Minimal tenderness left lower quadrant. Liver edge at costal margin. No tenderness in right upper quadrant. Rectal:  Hemoccult negative stool. Msk:  Symmetrical with no gross deformities. Normal posture. Extremities:  No clubbing, cyanosis, edema or deformities noted. Skin:  Intact without significant lesions or rashes. Psych:  Alert and cooperative. Normal mood and affect.   Impression & Recommendations:  Problem # 1:  DIARRHEA (ICD-787.91) Patient has had a relatively new onset of diarrhea which may be related to bacterial overgrowth from 10 day course of  Keflex after  oral surgery. We will start her on a probiotic daily and Flagyl 250 mg p.o. t.i.d. Her symptoms are very mild. There is nothing to suggest pseudomembranous colitis. She also has irritable bowel syndrome from a prior evaluation. It is possible that some of her symptoms may be related to colon spasm.  Problem # 2:  HEARTBURN (ICD-787.1) This is well controlled on AcipHex 20 mg 2-3 times a week. There is no evidence of dysphagia.  Problem # 3:  CHOLELITHIASIS (ICD-574.20) Patient had small gallstones on ultrasound in December 2010 with an otherwise normal-appearing gallbladder. None of her  symptoms are suggestive of biliary colic  Patient Instructions: 1)  Please pick up your prescriptions at the pharmacy. Electronic prescription(s) has already been sent for Flagyl 250 mg by mouth three times a day x 10 days. 2)  We have given you samples of Align to take 1 capsule by mouth once daily. This  puts good bacteria into your intestines. If this works well for you, it can be purchased over the counter.  3)  Copy sent to : Dr Darryll Capers 4)  The medication list was reviewed and reconciled.  All changed / newly prescribed medications were explained.  A complete medication list was provided to the patient / caregiver. Prescriptions: FLAGYL 250 MG TABS (METRONIDAZOLE) Take 1 tablet by mouth three times a day x 10 days  #30 x 0   Entered by:   Lamona Curl CMA (AAMA)   Authorized by:   Hart Carwin MD   Signed by:   Lamona Curl CMA (AAMA) on 09/29/2010   Method used:   Electronically to        [facility]. [phone]* (retail)       [address]       [place], [place]  [address]       Ph: [phone]       Fax: [phone]   RxID:   [id number]

## 2010-10-14 NOTE — Op Note (Signed)
Summary: Right Inguinal Hernia Repair                    [place]  Patient:    Margaret Green, Margaret Green                        MRN: [id number] Proc. Date: 12/28/99 Attending:  Catalina Lunger, M.D.                           Operative Report  PREOPERATIVE DIAGNOSIS:  Right groin pain.  POSTOPERATIVE DIAGNOSIS:  Recurrent right inguinal hernia.  OPERATION PERFORMED:  Recurrent right inguinal hernia repair with mesh.  SURGEON:  Stephenie Acres, M.D.  ANESTHESIA:  General.  DESCRIPTION OF PROCEDURE:  The patient was taken to the operating room and placed in supine position.  After adequate anesthesia was induced using laryngeal mask, the right groin was prepped and draped in normal sterile fashion.  Using the previous oblique inguinal incision, I dissected down onto the external oblique fascia.  This was opened along its fibers.  It was densely adhesed to the underlying transversalis fascia and muscle.  The ilioinguinal nerve was not identified.  Scar tissue was dissected free until the floor of Hasselbachs triangle was identified.  There was a bulging weakness here but no obvious hernia sac which could be dissected free.  I think the majority of this was secondary to scar tissue.  The inguinal ligament was identified along its course and transversalis fascia was identified.  A piece of 3 x 6 Atrium mesh was then placed over the defect and sutured using a running 2-0 Prolene medially at the pubic tubercle, superiorly at the transversalis fascia, inferiorly along the inguinal ligament and tied laterally.  All tissues were injected using Marcaine.  External oblique fascia was closed using running 3-0 Vicryl.  Skin was closed with staples.  The patient tolerated the procedure well and went to PACU in good condition. DD:  12/28/99 TD:  12/28/99 Job: [id number]

## 2010-10-14 NOTE — Progress Notes (Signed)
Summary: refill  Phone Note Call from Patient   Caller: Patient Call For: Stacie Glaze MD Summary of Call: Butalbital refill??  They only give her #10 and she would like to get #60.  [facility] [phone] Initial call taken by: Lynann Beaver CMA AAMA,  September 17, 2010 2:09 PM    Prescriptions: Sarita Haver 50-325-40 MG  [facility]) 1 q 4 hrs as needed  #60 x 0   Entered by:   Willy Eddy, LPN   Authorized by:   Stacie Glaze MD   Signed by:   Willy Eddy, LPN on 32/44/0102   Method used:   Electronically to        [facility]. [phone]* (retail)       [address]       [place], [place]  [address]       Ph: [phone]       Fax: [phone]   RxID:   [id number]

## 2010-10-21 ENCOUNTER — Other Ambulatory Visit: Payer: Self-pay | Admitting: Internal Medicine

## 2010-11-05 ENCOUNTER — Other Ambulatory Visit: Payer: Self-pay | Admitting: Internal Medicine

## 2010-11-26 ENCOUNTER — Encounter: Payer: Self-pay | Admitting: Internal Medicine

## 2010-11-29 ENCOUNTER — Encounter: Payer: Self-pay | Admitting: Internal Medicine

## 2010-12-05 ENCOUNTER — Other Ambulatory Visit: Payer: Self-pay | Admitting: Internal Medicine

## 2010-12-16 ENCOUNTER — Other Ambulatory Visit: Payer: Self-pay | Admitting: Internal Medicine

## 2010-12-28 ENCOUNTER — Ambulatory Visit (INDEPENDENT_AMBULATORY_CARE_PROVIDER_SITE_OTHER): Payer: 59 | Admitting: Internal Medicine

## 2010-12-28 ENCOUNTER — Encounter: Payer: Self-pay | Admitting: Internal Medicine

## 2010-12-28 VITALS — BP 136/80 | HR 80 | Temp 98.0°F | Resp 16 | Ht 65.0 in | Wt 158.0 lb

## 2010-12-28 DIAGNOSIS — Z0000 Encounter for general adult medical examination without abnormal findings: Secondary | ICD-10-CM

## 2010-12-28 DIAGNOSIS — Z136 Encounter for screening for cardiovascular disorders: Secondary | ICD-10-CM

## 2010-12-28 DIAGNOSIS — E538 Deficiency of other specified B group vitamins: Secondary | ICD-10-CM

## 2010-12-28 DIAGNOSIS — R51 Headache: Secondary | ICD-10-CM

## 2010-12-28 MED ORDER — CYANOCOBALAMIN 1000 MCG/ML IJ SOLN
1000.0000 ug | Freq: Once | INTRAMUSCULAR | Status: AC
  Administered 2010-12-28: 1000 ug via INTRAMUSCULAR

## 2010-12-28 MED ORDER — VERAPAMIL HCL 120 MG PO TBCR: 120.0000 mg | EXTENDED_RELEASE_TABLET | Freq: Every day | ORAL | Status: DC

## 2010-12-28 NOTE — Progress Notes (Signed)
  Subjective:    Patient ID: Margaret Green, female    DOB: [birth year], 50 y.o.   MRN: [id number]  HPI patient is a 50 year old white female who presents for complete physical examination.     Review of Systems  Constitutional: Negative for activity change, appetite change and fatigue.  HENT: Negative for ear pain, congestion, neck pain, postnasal drip and sinus pressure.   Eyes: Negative for redness and visual disturbance.  Respiratory: Negative for cough, shortness of breath and wheezing.   Gastrointestinal: Negative for abdominal pain and abdominal distention.  Genitourinary: Negative for dysuria, frequency and menstrual problem.  Musculoskeletal: Positive for joint swelling and arthralgias. Negative for myalgias.  Skin: Negative for rash and wound.  Neurological: Positive for weakness. Negative for dizziness and headaches.  Hematological: Negative for adenopathy. Does not bruise/bleed easily.  Psychiatric/Behavioral: Negative for sleep disturbance and decreased concentration.   .    Objective:   Physical Exam  Constitutional: She is oriented to person, place, and time. She appears well-developed and well-nourished. No distress.  HENT:  Head: Normocephalic and atraumatic.  Right Ear: External ear normal.  Left Ear: External ear normal.  Nose: Nose normal.  Mouth/Throat: Oropharynx is clear and moist.  Eyes: Conjunctivae and EOM are normal. Pupils are equal, round, and reactive to light.  Neck: Normal range of motion. Neck supple. No JVD present. No tracheal deviation present. No thyromegaly present.  Cardiovascular: Normal rate, regular rhythm, normal heart sounds and intact distal pulses.   No murmur heard. Pulmonary/Chest: Effort normal and breath sounds normal. She has no wheezes. She exhibits no tenderness.  Abdominal: Soft. Bowel sounds are normal.  Musculoskeletal: Normal range of motion. She exhibits tenderness. She exhibits no edema.       Right shoulder: She  exhibits tenderness.       Left shoulder: She exhibits tenderness.       Right elbow: tenderness found.       Left elbow: tenderness found.       Right hip: She exhibits tenderness.       Left hip: She exhibits tenderness.       Right knee: tenderness found.       Left knee: tenderness found.  Lymphadenopathy:    She has no cervical adenopathy.  Neurological: She is alert and oriented to person, place, and time. She has normal reflexes. No cranial nerve deficit.  Skin: Skin is warm and dry. She is not diaphoretic.  Psychiatric: She has a normal mood and affect. Her behavior is normal.          Assessment & Plan:   This is a routine physical examination for this healthy  Female. Reviewed all health maintenance protocols including mammography colonoscopy bone density and reviewed appropriate screening labs. Her immunization history was reviewed as well as her current medications and allergies refills of her chronic medications were given and the plan for yearly health maintenance was discussed all orders and referrals were made as appropriate.  The patient is doing well her headaches have improved significantly on the verapamil protocol therefore we will transition to long-acting verapamil once a day.  Her myalgia is stable her blood work was reviewed in detail with the patient and she is met our current goals for her and she will follow up in 3 months for the verapamil

## 2011-01-28 NOTE — Op Note (Signed)
[place]  Patient:    Margaret Green, Margaret Green                        MRN: [id number] Proc. Date: 12/28/99 Attending:  Catalina Lunger, M.D.                           Operative Report  PREOPERATIVE DIAGNOSIS:  Right groin pain.  POSTOPERATIVE DIAGNOSIS:  Recurrent right inguinal hernia.  OPERATION PERFORMED:  Recurrent right inguinal hernia repair with mesh.  SURGEON:  Stephenie Acres, M.D.  ANESTHESIA:  General.  DESCRIPTION OF PROCEDURE:  The patient was taken to the operating room and placed in supine position.  After adequate anesthesia was induced using laryngeal mask, the right groin was prepped and draped in normal sterile fashion.  Using the previous oblique inguinal incision, I dissected down onto the external oblique fascia.  This was opened along its fibers.  It was densely adhesed to the underlying transversalis fascia and muscle.  The ilioinguinal nerve was not identified.  Scar tissue was dissected free until the floor of Hasselbachs triangle was identified.  There was a bulging weakness here but no obvious hernia sac which could be dissected free.  I think the majority of this was secondary to scar tissue.  The inguinal ligament was identified along its course and transversalis fascia was identified.  A piece of 3 x 6 Atrium mesh was then placed over the defect and sutured using a running 2-0 Prolene medially at the pubic tubercle, superiorly at the transversalis fascia, inferiorly along the inguinal ligament and tied laterally.  All tissues were injected using Marcaine.  External oblique fascia was closed using running 3-0 Vicryl.  Skin was closed with staples.  The patient tolerated the procedure well and went to PACU in good condition. DD:  12/28/99 TD:  12/28/99 Job: [id number]

## 2011-01-28 NOTE — Op Note (Signed)
NAME:  Margaret Green, Margaret Green                     ACCOUNT NO.:  [id number]   MEDICAL RECORD NO.:  [id number]                   PATIENT TYPE:  AMB   LOCATION:  DSC                                  FACILITY:  [facility]   PHYSICIAN:  Kathy Breach, M.D.                   DATE OF BIRTH:  1961/02/22   DATE OF PROCEDURE:  09/09/2003  DATE OF DISCHARGE:                                 OPERATIVE REPORT   PREOPERATIVE DIAGNOSIS:  Granular right floor of mouth.   POSTOPERATIVE DIAGNOSIS:  Granular right floor of mouth.   OPERATION/PROCEDURE:  Marsupialization granular right floor of mouth.   DESCRIPTION OF PROCEDURE:  With the patient under general orotracheal  anesthesia, the dental gag placed in the left side exposing to oral cavity,  elevating the tongue, a large, bluish cystic mass, 3 cm maximum diameter, in  the right floor of mouth to the base of the tongue.  Area of the  submaxillary gland papilla to the midline near the lingual frenulum was  visualized.  Using a needle and light cutting current, incision was made  through the medial meniscus along the margin of the granular cyst with the  tongue side.  It was made through the mucosa and thin submucosal tissue down  onto the wall of the cyst.  Because of the very narrow mandible and limited  access, decision was made to marsupialize rather than attempt to excise due  to very poor access to the anatomy.  Incision was made into the cyst and a  clear mucoid fluid suctioned from it.  Kittner pack was placed in the  interior of the cyst to maintain its openness.  Incision was made through-  and-through, uncapping the upper dome of the cyst and sequentially tacking  with interrupted 4-0 chromic catgut sutures the cyst wall to the floor of  the mouth mucosa all along the medial and posterior incision lines.  This  was continued around laterally.  No structure.  Adequate to appear to be the  submaxillary gland duct was visualized.  The papilla was  clipped in an  attempt to cannulate the duct at the completion of the case was  unsuccessful.  The duct was never readily visible.  Occasionally some scant  amount of clear saliva could be expressed through the area without clear  identification of the duct or punctum of the duct.  Given limited access to  the area due to the patient's narrow jaw and anatomy, marsupialization was  completed and no further attempt to completely excise the granular cyst wall  deep surface was made.  Blood loss for the procedure was less than 10 mL.  Minimal bleeding points were controlled with light touch needle cautery.  The patient tolerated the procedure well.  Was taken to the recovery room in  stable general condition.  Kathy Breach, M.D.    Margaret Green  D:  09/09/2003  T:  09/09/2003  Job:  [id number]

## 2011-03-14 ENCOUNTER — Other Ambulatory Visit: Payer: Self-pay | Admitting: Internal Medicine

## 2011-03-30 ENCOUNTER — Encounter: Payer: Self-pay | Admitting: Internal Medicine

## 2011-04-01 ENCOUNTER — Ambulatory Visit: Payer: 59 | Admitting: Internal Medicine

## 2011-04-08 ENCOUNTER — Ambulatory Visit (INDEPENDENT_AMBULATORY_CARE_PROVIDER_SITE_OTHER): Payer: 59 | Admitting: Internal Medicine

## 2011-04-08 ENCOUNTER — Encounter: Payer: Self-pay | Admitting: Internal Medicine

## 2011-04-08 DIAGNOSIS — E039 Hypothyroidism, unspecified: Secondary | ICD-10-CM

## 2011-04-08 DIAGNOSIS — R1084 Generalized abdominal pain: Secondary | ICD-10-CM

## 2011-04-08 DIAGNOSIS — IMO0001 Reserved for inherently not codable concepts without codable children: Secondary | ICD-10-CM

## 2011-04-08 DIAGNOSIS — D510 Vitamin B12 deficiency anemia due to intrinsic factor deficiency: Secondary | ICD-10-CM

## 2011-04-08 LAB — CBC WITH DIFFERENTIAL/PLATELET
Basophils Absolute: 0 10*3/uL (ref 0.0–0.1)
Basophils Relative: 0.4 % (ref 0.0–3.0)
Eosinophils Absolute: 0 10*3/uL (ref 0.0–0.7)
Lymphocytes Relative: 43 % (ref 12.0–46.0)
MCHC: 33.8 g/dL (ref 30.0–36.0)
Neutrophils Relative %: 46.8 % (ref 43.0–77.0)
Platelets: 259 10*3/uL (ref 150.0–400.0)
RBC: 4.36 Mil/uL (ref 3.87–5.11)
RDW: 13.2 % (ref 11.5–14.6)

## 2011-04-08 LAB — VITAMIN B12: Vitamin B-12: 160 pg/mL — ABNORMAL LOW (ref 211–911)

## 2011-04-08 LAB — BASIC METABOLIC PANEL
BUN: 9 mg/dL (ref 6–23)
CO2: 29 mEq/L (ref 19–32)
Calcium: 8.5 mg/dL (ref 8.4–10.5)
Creatinine, Ser: 0.6 mg/dL (ref 0.4–1.2)

## 2011-04-08 LAB — TSH: TSH: 4.21 u[IU]/mL (ref 0.35–5.50)

## 2011-04-08 NOTE — Progress Notes (Signed)
Subjective:    Patient ID: Margaret Green, female    DOB: [birth year], 50 y.o.   MRN: [id number]  HPI  The patient is a 50 year old white female who is followed for hyperlipidemia elevated blood pressure history without diagnosis of hypertension history of migraine headaches hypothyroidism and a working diagnosis of fibromyalgia.  She has been her fibromyalgia medication now for several years with good results she wishes to titrate off that medication we discussed the titration planned for her.  She is concerned that this medication has resulted in weight gain  Review of Systems  Constitutional: Negative for activity change, appetite change and fatigue.  HENT: Negative for ear pain, congestion, neck pain, postnasal drip and sinus pressure.   Eyes: Negative for redness and visual disturbance.  Respiratory: Negative for cough, shortness of breath and wheezing.   Gastrointestinal: Negative for abdominal pain and abdominal distention.  Genitourinary: Negative for dysuria, frequency and menstrual problem.  Musculoskeletal: Negative for myalgias, joint swelling and arthralgias.  Skin: Negative for rash and wound.  Neurological: Negative for dizziness, weakness and headaches.  Hematological: Negative for adenopathy. Does not bruise/bleed easily.  Psychiatric/Behavioral: Negative for sleep disturbance and decreased concentration.   Past Medical History  Diagnosis Date  . Anxiety   . Stricture and stenosis of esophagus   . Elevated blood pressure reading without diagnosis of hypertension   . Dyspepsia   . Endometriosis, site unspecified   . GERD (gastroesophageal reflux disease)   . Neutropenia   . Other symptoms involving digestive system   . Abdominal  pain, other specified site   . Allergy   . Abnormal weight gain   . Symptomatic menopausal or female climacteric states   . Myalgia and myositis, unspecified   . Arthritis   . Raynaud's syndrome   . Hyperlipidemia   .  Hypothyroidism   . Headache   . Family history of diabetes mellitus    Past Surgical History  Procedure Date  . Abdominal hysterectomy   . Hernia repair   . Breast surgery     biopsy  . Laparoscopy   . Elevated lipis     reports that she has never smoked. She does not have any smokeless tobacco history on file. She reports that she does not drink alcohol or use illicit drugs. family history includes Cirrhosis in her mother; Coronary artery disease in an unspecified family member; Diabetes in her mother; Heart disease in her father; Ovarian cancer in an unspecified family member; and Stomach cancer in an unspecified family member.  There is no history of Colon cancer. Allergies  Allergen Reactions  . Codeine   . Meloxicam     REACTION: rash: itching  . Oxycodone-Aspirin     REACTION: rash: itching  . Sulfamethoxazole     REACTION: rash: itching       Objective:   Physical Exam  Constitutional: She is oriented to person, place, and time. She appears well-developed and well-nourished. No distress.  HENT:  Head: Normocephalic and atraumatic.  Right Ear: External ear normal.  Left Ear: External ear normal.  Nose: Nose normal.  Mouth/Throat: Oropharynx is clear and moist.  Eyes: Conjunctivae and EOM are normal. Pupils are equal, round, and reactive to light.  Neck: Normal range of motion. Neck supple. No JVD present. No tracheal deviation present. No thyromegaly present.  Cardiovascular: Normal rate, regular rhythm, normal heart sounds and intact distal pulses.   No murmur heard. Pulmonary/Chest: Effort normal and breath sounds normal. She has no wheezes.  She exhibits no tenderness.  Abdominal: Soft. Bowel sounds are normal.  Musculoskeletal: Normal range of motion. She exhibits tenderness. She exhibits no edema.  Lymphadenopathy:    She has no cervical adenopathy.  Neurological: She is alert and oriented to person, place, and time. She has normal reflexes. No cranial nerve  deficit.  Skin: Skin is warm and dry. She is not diaphoretic.  Psychiatric: She has a normal mood and affect. Her behavior is normal.          Assessment & Plan:   Persistent weight gain possibly secondary due to her antidepressant medications wean off any depression medications and monitor history of fibromyalgia doing really well with exercise and concerned that she may have a flare of her fibromyalgia off the medications and we have wonder what signs to look forward to and when to call if she has any of the symptoms.  We'll monitor her thyroid function and her basic metabolic panel and a CBC because of a history of anemia.

## 2011-04-08 NOTE — Patient Instructions (Addendum)
Cut portions  Use the savella starting pack in reverse to wean off the medications once you are off the medications watch for these 3 things 1. depressed 2. increased muscular pain 3. sleep disorder

## 2011-04-26 ENCOUNTER — Other Ambulatory Visit: Payer: Self-pay | Admitting: Internal Medicine

## 2011-05-20 ENCOUNTER — Other Ambulatory Visit: Payer: Self-pay | Admitting: Internal Medicine

## 2011-05-23 ENCOUNTER — Telehealth: Payer: Self-pay | Admitting: Internal Medicine

## 2011-05-23 NOTE — Telephone Encounter (Signed)
May refill 

## 2011-05-23 NOTE — Telephone Encounter (Signed)
Pharmacy contacted [place] regarding prescription said it came through as a void. Please resend Xanax prescription either by fax or through computer.  Fax [phone]

## 2011-05-24 NOTE — Telephone Encounter (Signed)
rx called into pharmacy

## 2011-06-23 ENCOUNTER — Other Ambulatory Visit: Payer: Self-pay | Admitting: Internal Medicine

## 2011-07-19 ENCOUNTER — Encounter: Payer: Self-pay | Admitting: Internal Medicine

## 2011-08-02 ENCOUNTER — Other Ambulatory Visit: Payer: Self-pay | Admitting: Internal Medicine

## 2011-09-15 ENCOUNTER — Ambulatory Visit (INDEPENDENT_AMBULATORY_CARE_PROVIDER_SITE_OTHER): Payer: 59

## 2011-09-15 DIAGNOSIS — M549 Dorsalgia, unspecified: Secondary | ICD-10-CM

## 2011-09-20 ENCOUNTER — Other Ambulatory Visit: Payer: Self-pay | Admitting: Internal Medicine

## 2011-10-01 ENCOUNTER — Ambulatory Visit (INDEPENDENT_AMBULATORY_CARE_PROVIDER_SITE_OTHER): Payer: 59

## 2011-10-01 DIAGNOSIS — M549 Dorsalgia, unspecified: Secondary | ICD-10-CM

## 2011-10-18 ENCOUNTER — Ambulatory Visit: Payer: 59 | Attending: Family Medicine | Admitting: Physical Therapy

## 2011-10-18 DIAGNOSIS — M2569 Stiffness of other specified joint, not elsewhere classified: Secondary | ICD-10-CM | POA: Insufficient documentation

## 2011-10-18 DIAGNOSIS — M545 Low back pain, unspecified: Secondary | ICD-10-CM | POA: Insufficient documentation

## 2011-10-18 DIAGNOSIS — IMO0001 Reserved for inherently not codable concepts without codable children: Secondary | ICD-10-CM | POA: Insufficient documentation

## 2011-10-20 ENCOUNTER — Ambulatory Visit: Payer: 59 | Admitting: Physical Therapy

## 2011-10-24 ENCOUNTER — Ambulatory Visit: Payer: 59 | Admitting: Physical Therapy

## 2011-10-26 ENCOUNTER — Ambulatory Visit: Payer: 59 | Admitting: Physical Therapy

## 2011-10-27 ENCOUNTER — Encounter: Payer: 59 | Admitting: Physical Therapy

## 2011-12-23 ENCOUNTER — Other Ambulatory Visit (INDEPENDENT_AMBULATORY_CARE_PROVIDER_SITE_OTHER): Payer: 59

## 2011-12-23 DIAGNOSIS — Z0000 Encounter for general adult medical examination without abnormal findings: Secondary | ICD-10-CM

## 2011-12-23 LAB — HEPATIC FUNCTION PANEL
ALT: 25 U/L (ref 0–35)
Bilirubin, Direct: 0 mg/dL (ref 0.0–0.3)
Total Bilirubin: 0.3 mg/dL (ref 0.3–1.2)

## 2011-12-23 LAB — BASIC METABOLIC PANEL
BUN: 11 mg/dL (ref 6–23)
Calcium: 8.6 mg/dL (ref 8.4–10.5)
Creatinine, Ser: 0.7 mg/dL (ref 0.4–1.2)
GFR: 102.57 mL/min (ref >60.00)

## 2011-12-23 LAB — POCT URINALYSIS DIPSTICK
Bilirubin, UA: NEGATIVE
Ketones, UA: NEGATIVE
Leukocytes, UA: NEGATIVE
pH, UA: 7

## 2011-12-23 LAB — CBC WITH DIFFERENTIAL/PLATELET
Eosinophils Relative: 1.5 % (ref 0.0–5.0)
Lymphocytes Relative: 43.9 % (ref 12.0–46.0)
MCV: 94.2 fl (ref 78.0–100.0)
Monocytes Absolute: 0.5 10*3/uL (ref 0.1–1.0)
Monocytes Relative: 9 % (ref 3.0–12.0)
Neutrophils Relative %: 45.2 % (ref 43.0–77.0)
Platelets: 242 10*3/uL (ref 150.0–400.0)
WBC: 5.2 10*3/uL (ref 4.5–10.5)

## 2011-12-23 LAB — LIPID PANEL
Cholesterol: 157 mg/dL (ref 0–200)
HDL: 46.4 mg/dL (ref >39.00)
VLDL: 11.6 mg/dL (ref 0.0–40.0)

## 2011-12-30 ENCOUNTER — Encounter: Payer: 59 | Admitting: Internal Medicine

## 2011-12-31 ENCOUNTER — Other Ambulatory Visit: Payer: Self-pay | Admitting: Internal Medicine

## 2012-01-09 ENCOUNTER — Ambulatory Visit (INDEPENDENT_AMBULATORY_CARE_PROVIDER_SITE_OTHER): Payer: 59 | Admitting: Internal Medicine

## 2012-01-09 ENCOUNTER — Encounter: Payer: Self-pay | Admitting: Internal Medicine

## 2012-01-09 VITALS — BP 144/80 | HR 72 | Temp 98.2°F | Resp 16 | Ht 65.0 in | Wt 170.0 lb

## 2012-01-09 DIAGNOSIS — M797 Fibromyalgia: Secondary | ICD-10-CM

## 2012-01-09 DIAGNOSIS — Z0000 Encounter for general adult medical examination without abnormal findings: Secondary | ICD-10-CM

## 2012-01-09 DIAGNOSIS — E669 Obesity, unspecified: Secondary | ICD-10-CM

## 2012-01-09 DIAGNOSIS — IMO0001 Reserved for inherently not codable concepts without codable children: Secondary | ICD-10-CM

## 2012-01-09 MED ORDER — PHENTERMINE HCL 37.5 MG PO CAPS: 37.5000 mg | ORAL_CAPSULE | ORAL | Status: DC

## 2012-01-09 MED ORDER — MILNACIPRAN HCL 50 MG PO TABS: 50.0000 mg | ORAL_TABLET | Freq: Two times a day (BID) | ORAL | Status: DC

## 2012-01-09 NOTE — Progress Notes (Signed)
Subjective:    Patient ID: Margaret Green, female    DOB: 07/17/1961, 51 y.o.   MRN: [id number]  HPI  Patient presents for as well as examination.  She also has a history of hypothyroidism history of anemia a history of fibromyalgia and recently weight gain of approximately 30 pounds.  She states that since she's been menopausal she has had increased appetite and her flashes and she's treated this with  Review of Systems  Constitutional: Negative for activity change, appetite change and fatigue.  HENT: Negative for ear pain, congestion, neck pain, postnasal drip and sinus pressure.   Eyes: Negative for redness and visual disturbance.  Respiratory: Negative for cough, shortness of breath and wheezing.   Gastrointestinal: Negative for abdominal pain and abdominal distention.  Genitourinary: Negative for dysuria, frequency and menstrual problem.  Musculoskeletal: Negative for myalgias, joint swelling and arthralgias.  Skin: Negative for rash and wound.  Neurological: Negative for dizziness, weakness and headaches.  Hematological: Negative for adenopathy. Does not bruise/bleed easily.  Psychiatric/Behavioral: Negative for sleep disturbance and decreased concentration.   Past Medical History  Diagnosis Date  . Anxiety   . Stricture and stenosis of esophagus   . Elevated blood pressure reading without diagnosis of hypertension   . Dyspepsia   . Endometriosis, site unspecified   . GERD (gastroesophageal reflux disease)   . Neutropenia   . Other symptoms involving digestive system   . Abdominal  pain, other specified site   . Allergy   . Abnormal weight gain   . Symptomatic menopausal or female climacteric states   . Myalgia and myositis, unspecified   . Arthritis   . Raynaud's syndrome   . Hyperlipidemia   . Hypothyroidism   . Headache   . Family history of diabetes mellitus     History   Social History  . Marital Status: Single    Spouse Name: N/A    Number of  Children: N/A  . Years of Education: N/A   Occupational History  . Not on file.   Social History Main Topics  . Smoking status: Never Smoker   . Smokeless tobacco: Not on file  . Alcohol Use: No  . Drug Use: No  . Sexually Active: Yes   Other Topics Concern  . Not on file   Social History Narrative  . No narrative on file    Past Surgical History  Procedure Date  . Abdominal hysterectomy   . Hernia repair   . Breast surgery     biopsy  . Laparoscopy   . Elevated lipis     Family History  Problem Relation Age of Onset  . Coronary artery disease    . Ovarian cancer    . Stomach cancer    . Cirrhosis Mother   . Diabetes Mother   . Heart disease Father   . Colon cancer Neg Hx     Allergies  Allergen Reactions  . Codeine   . Meloxicam     REACTION: rash: itching  . Oxycodone-Aspirin     REACTION: rash: itching  . Sulfamethoxazole     REACTION: rash: itching    Current Outpatient Prescriptions on File Prior to Visit  Medication Sig Dispense Refill  . ACIPHEX 20 MG tablet take 1 tablet by mouth once daily  30 tablet  2  . ALPRAZolam (XANAX) 0.25 MG tablet TAKE 1 TABLET BY MOUTH THREE TIMES DAILY if needed  90 tablet  2  . butalbital-acetaminophen-caffeine (FIORICET WITH CODEINE) 50-325-40-30 MG  per capsule Take 1 capsule by mouth every 4 (four) hours as needed.        . butalbital-aspirin-caffeine (FIORINAL) 50-325-40 MG per capsule take 1 capsule by mouth every 4 hours if needed  60 capsule  1  . Calcium Carb-Cholecalciferol 500-400 MG-UNIT CHEW Chew 500 mg by mouth daily.        . cetirizine (ZYRTEC) 10 MG tablet Take 10 mg by mouth daily.        . diclofenac (VOLTAREN) 75 MG EC tablet TAKE 1 TABLET BY MOUTH 2 TIMES A DAY AS NEEDED FOR PAIN  60 tablet  1  . diphenoxylate-atropine (LOMOTIL) 2.5-0.025 MG per tablet Take 1 tablet by mouth 4 (four) times daily as needed.        . fluticasone (FLONASE) 50 MCG/ACT nasal spray USE AS DIRECTED BY PRESCRIBER  16 g  10    . LIPITOR 20 MG tablet take 1 tablet by mouth every evening  30 tablet  11  . promethazine (PHENERGAN) 25 MG tablet Take 25 mg by mouth every 6 (six) hours as needed.        . SUMAtriptan (IMITREX) 100 MG tablet take as directed if needed  6 tablet  6  . SYNTHROID 100 MCG tablet TAKE 1 TABLET BY MOUTH ONCE DAILY  30 tablet  10  . tiZANidine (ZANAFLEX) 4 MG tablet TAKE 1 TABLET BY MOUTH TWICE DAILY AS NEEDED  60 tablet  5  . verapamil (CALAN-SR) 120 MG CR tablet take 1 tablet by mouth every evening  30 tablet  11  . zolpidem (AMBIEN) 10 MG tablet Take 10 mg by mouth at bedtime as needed.        Marland Kitchen DISCONTD: SAVELLA 50 MG TABS TAKE 1 TABLET BY MOUTH 2 TIMES A DAY  60 tablet  11  . phentermine 37.5 MG capsule Take 1 capsule (37.5 mg total) by mouth every morning.  30 capsule  3    BP 144/80  Pulse 72  Temp 98.2 F (36.8 C)  Resp 16  Ht 5\' 5"  (1.651 m)  Wt 170 lb (77.111 kg)  BMI 28.29 kg/m2       Objective:   Physical Exam  Nursing note and vitals reviewed. Constitutional: She is oriented to person, place, and time. She appears well-developed and well-nourished. No distress.  HENT:  Head: Normocephalic and atraumatic.  Right Ear: External ear normal.  Left Ear: External ear normal.  Nose: Nose normal.  Mouth/Throat: Oropharynx is clear and moist.  Eyes: Conjunctivae and EOM are normal. Pupils are equal, round, and reactive to light.  Neck: Normal range of motion. Neck supple. No JVD present. No tracheal deviation present. No thyromegaly present.  Cardiovascular: Normal rate, regular rhythm, normal heart sounds and intact distal pulses.   No murmur heard. Pulmonary/Chest: Effort normal and breath sounds normal. She has no wheezes. She exhibits no tenderness.  Abdominal: Soft. Bowel sounds are normal.  Musculoskeletal: Normal range of motion. She exhibits no edema and no tenderness.  Lymphadenopathy:    She has no cervical adenopathy.  Neurological: She is alert and oriented to  person, place, and time. She has normal reflexes. No cranial nerve deficit.  Skin: Skin is warm and dry. She is not diaphoretic.  Psychiatric: She has a normal mood and affect. Her behavior is normal.          Assessment & Plan:   This is a routine physical examination for this healthy  Female. Reviewed all health maintenance protocols  including mammography colonoscopy bone density and reviewed appropriate screening labs. Her immunization history was reviewed as well as her current medications and allergies refills of her chronic medications were given and the plan for yearly health maintenance was discussed all orders and referrals were made as appropriate.   He discussed weight loss does use including the use of intermittent will prescribe phentermine for her as it has been effective in the past for weight loss.  Discussed her perimenopausal symptoms and also discussed her fibromyalgia recommend turning to [facility]

## 2012-01-09 NOTE — Patient Instructions (Addendum)
The patient is instructed to continue all medications as prescribed. Schedule followup with check out clerk upon leaving the clinic   Control appetitive  ; phentermine  Increased activity  Decrease portions

## 2012-03-07 ENCOUNTER — Other Ambulatory Visit: Payer: Self-pay | Admitting: Internal Medicine

## 2012-04-03 ENCOUNTER — Other Ambulatory Visit: Payer: Self-pay | Admitting: Internal Medicine

## 2012-04-09 ENCOUNTER — Ambulatory Visit (INDEPENDENT_AMBULATORY_CARE_PROVIDER_SITE_OTHER): Payer: 59 | Admitting: Internal Medicine

## 2012-04-09 ENCOUNTER — Encounter: Payer: Self-pay | Admitting: Internal Medicine

## 2012-04-09 VITALS — BP 140/80 | HR 80 | Temp 98.1°F | Resp 16 | Ht 65.0 in | Wt 158.0 lb

## 2012-04-09 DIAGNOSIS — F411 Generalized anxiety disorder: Secondary | ICD-10-CM

## 2012-04-09 DIAGNOSIS — E669 Obesity, unspecified: Secondary | ICD-10-CM

## 2012-04-09 DIAGNOSIS — F419 Anxiety disorder, unspecified: Secondary | ICD-10-CM

## 2012-04-09 DIAGNOSIS — R232 Flushing: Secondary | ICD-10-CM

## 2012-04-09 MED ORDER — PHENTERMINE HCL 37.5 MG PO CAPS: 37.5000 mg | ORAL_CAPSULE | ORAL | Status: DC

## 2012-04-09 MED ORDER — CLONAZEPAM 0.5 MG PO TABS: 0.2500 mg | ORAL_TABLET | Freq: Two times a day (BID) | ORAL | Status: DC

## 2012-04-09 NOTE — Progress Notes (Signed)
Subjective:    Patient ID: Margaret Green, female    DOB: [birth year], 51 y.o.   MRN: [id number]  HPI Significant facial flushing, elevated blood pressure several times a day 10-15 minutes. Worse with anxiety or recognition. No palpitations, no head aches, Screening US of carotids at work negative   Review of Systems  Constitutional: Negative for activity change, appetite change and fatigue.  HENT: Negative for ear pain, congestion, neck pain, postnasal drip and sinus pressure.   Eyes: Negative for redness and visual disturbance.  Respiratory: Negative for cough, shortness of breath and wheezing.   Gastrointestinal: Negative for abdominal pain and abdominal distention.  Genitourinary: Negative for dysuria, frequency and menstrual problem.  Musculoskeletal: Negative for myalgias, joint swelling and arthralgias.  Skin: Negative for rash and wound.  Neurological: Negative for dizziness, weakness and headaches.  Hematological: Negative for adenopathy. Does not bruise/bleed easily.  Psychiatric/Behavioral: Negative for disturbed wake/sleep cycle and decreased concentration.    The patient is instructed to continue all medications as prescribed. Schedule followup with check out clerk upon leaving the clinic     Past Medical History  Diagnosis Date  . Anxiety   . Stricture and stenosis of esophagus   . Blood pressure elevated without history of HTN   . Dyspepsia   . Endometriosis, site unspecified   . GERD (gastroesophageal reflux disease)   . Neutropenia   . Change in bowel function   . Abdominal pain, bilateral lower quadrant   . Allergy   . Abnormal weight gain   . Symptomatic menopausal or female climacteric states   . Myalgia and myositis, unspecified   . Arthritis   . Raynaud's syndrome   . Hyperlipidemia   . Hypothyroidism   . Headache   . Family history of diabetes mellitus     History   Social History  . Marital Status: Single    Spouse Name: N/A    Number  of Children: N/A  . Years of Education: N/A   Occupational History  . Not on file.   Social History Main Topics  . Smoking status: Never Smoker   . Smokeless tobacco: Not on file  . Alcohol Use: No  . Drug Use: No  . Sexually Active: Yes   Other Topics Concern  . Not on file   Social History Narrative  . No narrative on file    Past Surgical History  Procedure Date  . Abdominal hysterectomy   . Hernia repair   . Breast surgery     biopsy  . Laparoscopy   . Elevated lipis     Family History  Problem Relation Age of Onset  . Coronary artery disease    . Ovarian cancer    . Stomach cancer    . Cirrhosis Mother   . Diabetes Mother   . Heart disease Father   . Colon cancer Neg Hx     Allergies  Allergen Reactions  . Codeine   . Meloxicam     REACTION: rash: itching  . Oxycodone-Aspirin     REACTION: rash: itching  . Sulfamethoxazole     REACTION: rash: itching    Current Outpatient Prescriptions on File Prior to Visit  Medication Sig Dispense Refill  . ACIPHEX 20 MG tablet take 1 tablet by mouth once daily  30 tablet  2  . ALPRAZolam (XANAX) 0.25 MG tablet TAKE 1 TABLET BY MOUTH THREE TIMES DAILY if needed  90 tablet  2  . butalbital-acetaminophen-caffeine (FIORICET WITH CODEINE) [id number]  MG per capsule Take 1 capsule by mouth every 4 (four) hours as needed.        . butalbital-aspirin-caffeine (FIORINAL) 50-325-40 MG per capsule take 1 capsule by mouth every 4 hours if needed  60 capsule  1  . Calcium Carb-Cholecalciferol 500-400 MG-UNIT CHEW Chew 500 mg by mouth daily.        . cetirizine (ZYRTEC) 10 MG tablet Take 10 mg by mouth daily.        . diclofenac (VOLTAREN) 75 MG EC tablet TAKE 1 TABLET BY MOUTH 2 TIMES A DAY AS NEEDED FOR PAIN  60 tablet  1  . diphenoxylate-atropine (LOMOTIL) 2.5-0.025 MG per tablet Take 1 tablet by mouth 4 (four) times daily as needed.        . fluticasone (FLONASE) 50 MCG/ACT nasal spray USE AS DIRECTED BY PRESCRIBER  16 g   10  . LIPITOR 20 MG tablet take 1 tablet by mouth every evening  30 tablet  11  . Milnacipran (SAVELLA) 50 MG TABS Take 1 tablet (50 mg total) by mouth 2 (two) times daily.  60 tablet  11  . promethazine (PHENERGAN) 25 MG tablet Take 25 mg by mouth every 6 (six) hours as needed.        . SUMAtriptan (IMITREX) 100 MG tablet take as directed if needed  6 tablet  6  . SYNTHROID 100 MCG tablet TAKE 1 TABLET BY MOUTH ONCE DAILY  30 tablet  10  . tiZANidine (ZANAFLEX) 4 MG tablet TAKE 1 TABLET BY MOUTH TWICE DAILY AS NEEDED  60 tablet  5  . verapamil (CALAN-SR) 120 MG CR tablet take 1 tablet by mouth every evening  30 tablet  11  . zolpidem (AMBIEN) 10 MG tablet Take 10 mg by mouth at bedtime as needed.        Marland Kitchen DISCONTD: phentermine 37.5 MG capsule Take 1 capsule (37.5 mg total) by mouth every morning.  30 capsule  3    BP 140/80  Pulse 80  Temp 98.1 F (36.7 C)  Resp 16  Ht 5\' 5"  (1.651 m)  Wt 158 lb (71.668 kg)  BMI 26.29 kg/m2    Objective:   Physical Exam  Nursing note and vitals reviewed. Constitutional: She is oriented to person, place, and time. She appears well-developed and well-nourished. No distress.  HENT:  Head: Normocephalic and atraumatic.  Right Ear: External ear normal.  Left Ear: External ear normal.  Nose: Nose normal.  Mouth/Throat: Oropharynx is clear and moist.  Eyes: Conjunctivae and EOM are normal. Pupils are equal, round, and reactive to light.  Neck: Normal range of motion. Neck supple. No JVD present. No tracheal deviation present. No thyromegaly present.  Cardiovascular: Normal rate, regular rhythm, normal heart sounds and intact distal pulses.   No murmur heard. Pulmonary/Chest: Effort normal and breath sounds normal. She has no wheezes. She exhibits no tenderness.  Abdominal: Soft. Bowel sounds are normal.  Musculoskeletal: Normal range of motion. She exhibits no edema and no tenderness.  Lymphadenopathy:    She has no cervical adenopathy.    Neurological: She is alert and oriented to person, place, and time. She has normal reflexes. No cranial nerve deficit.  Skin: Skin is warm and dry. She is not diaphoretic.  Psychiatric: She has a normal mood and affect. Her behavior is normal.          Assessment & Plan:  There is a possibility that the facial flushing may be more related to anxiety syndrome  than anything else she's been stable her hormone replacement but it again age can cause some instability and it could be related to hormone replacement as well medications that she is on that could be indicated for flushing could be her calcium channel blocker  Otherwise other etiologies that would be of greatest concern would be a carcinoid syndrome therefore 24-hour urine will be obtained for screening purposes and if the use of a long-acting anxiety a lytic does not result in complete cessation or significant moderation of the flushing will proceed with a neurological workup to look for MS and Parkinson's type syndromes

## 2012-04-09 NOTE — Patient Instructions (Addendum)
The patient is instructed to continue all medications as prescribed. Schedule followup with check out clerk upon leaving the clinic  

## 2012-04-13 ENCOUNTER — Other Ambulatory Visit: Payer: Self-pay | Admitting: Internal Medicine

## 2012-04-16 NOTE — Telephone Encounter (Signed)
Last see 04/09/12 vasomotor flushing Last written 09/20/11 # 60 1Rf Please advise

## 2012-04-17 ENCOUNTER — Other Ambulatory Visit: Payer: Self-pay | Admitting: *Deleted

## 2012-05-04 ENCOUNTER — Other Ambulatory Visit: Payer: Self-pay | Admitting: *Deleted

## 2012-06-20 ENCOUNTER — Ambulatory Visit: Payer: 59 | Admitting: Internal Medicine

## 2012-07-01 ENCOUNTER — Ambulatory Visit (INDEPENDENT_AMBULATORY_CARE_PROVIDER_SITE_OTHER): Payer: 59 | Admitting: Family Medicine

## 2012-07-01 VITALS — BP 135/86 | HR 109 | Temp 98.6°F | Resp 17 | Ht 64.0 in | Wt 155.0 lb

## 2012-07-01 DIAGNOSIS — L0390 Cellulitis, unspecified: Secondary | ICD-10-CM

## 2012-07-01 DIAGNOSIS — L0291 Cutaneous abscess, unspecified: Secondary | ICD-10-CM

## 2012-07-01 MED ORDER — DOXYCYCLINE HYCLATE 100 MG PO TABS: 100.0000 mg | ORAL_TABLET | Freq: Two times a day (BID) | ORAL | Status: DC

## 2012-07-01 NOTE — Progress Notes (Signed)
51 yo woman who came back from [facility] for breast cancer fund raiser.  She was [place] the beach and two days ago developed a rash on both ankles, worse on left  Objective:  NAD Small erythematous papule left forehead Mild erythema and swelling right lateral ankle Moderate to severe 3-4 cm annular erythematous areas left ankle in 2 places with central while macule, the posterior one of which is oozing clear serum  Assessment:  Insect bite  Plan:

## 2012-07-01 NOTE — Patient Instructions (Signed)
Cellulitis Cellulitis is an infection of the skin and the tissue beneath it. The infected area is usually red and tender. Cellulitis occurs most often in the arms and lower legs.   CAUSES   Cellulitis is caused by bacteria that enter the skin through cracks or cuts in the skin. The most common types of bacteria that cause cellulitis are Staphylococcus and Streptococcus. SYMPTOMS    Redness and warmth.   Swelling.   Tenderness or pain.   Fever.  DIAGNOSIS  Your caregiver can usually determine what is wrong based on a physical exam. Blood tests may also be done. TREATMENT   Treatment usually involves taking an antibiotic medicine. HOME CARE INSTRUCTIONS    Take your antibiotics as directed. Finish them even if you start to feel better.   Keep the infected arm or leg elevated to reduce swelling.   Apply a warm cloth to the affected area up to 4 times per day to relieve pain.   Only take over-the-counter or prescription medicines for pain, discomfort, or fever as directed by your caregiver.   Keep all follow-up appointments as directed by your caregiver.  SEEK MEDICAL CARE IF:    You notice red streaks coming from the infected area.   Your red area gets larger or turns dark in color.   Your bone or joint underneath the infected area becomes painful after the skin has healed.   Your infection returns in the same area or another area.   You notice a swollen bump in the infected area.   You develop new symptoms.  SEEK IMMEDIATE MEDICAL CARE IF:    You have a fever.   You feel very sleepy.   You develop vomiting or diarrhea.   You have a general ill feeling (malaise) with muscle aches and pains.  MAKE SURE YOU:    Understand these instructions.   Will watch your condition.   Will get help right away if you are not doing well or get worse.  Document Released: 06/08/2005 Document Revised: 02/28/2012 Document Reviewed: 11/14/2011 [employer] Patient Information 2013  [employer]    

## 2012-07-03 ENCOUNTER — Telehealth: Payer: Self-pay

## 2012-07-03 NOTE — Telephone Encounter (Signed)
I spoke to patient, she advised the Doxy is making her feel bad, she is advised to try taking it with food (no dairy) and to let me know if it still bothers her. If it does, we may need to change it. She wanted to know if she could d/c this. I advised not to d/c the antibiotic, if it still bothers her stomach, we would need to change it. She will call back if taking it with food does not help.

## 2012-07-03 NOTE — Telephone Encounter (Signed)
Patient states meds given Sunday for insect bite cause upset stomach and headache. Would like to know if something else can be called in. [facility] on [address].

## 2012-07-03 NOTE — Telephone Encounter (Signed)
LMOM for pt to return call. 

## 2012-07-12 ENCOUNTER — Encounter: Payer: Self-pay | Admitting: Internal Medicine

## 2012-07-13 ENCOUNTER — Other Ambulatory Visit: Payer: Self-pay | Admitting: Internal Medicine

## 2012-07-26 ENCOUNTER — Ambulatory Visit: Payer: 59

## 2012-07-26 ENCOUNTER — Ambulatory Visit (INDEPENDENT_AMBULATORY_CARE_PROVIDER_SITE_OTHER): Payer: 59 | Admitting: Family Medicine

## 2012-07-26 VITALS — BP 132/82 | HR 78 | Temp 97.8°F | Resp 16 | Ht 65.0 in | Wt 155.0 lb

## 2012-07-26 DIAGNOSIS — R0781 Pleurodynia: Secondary | ICD-10-CM

## 2012-07-26 DIAGNOSIS — R079 Chest pain, unspecified: Secondary | ICD-10-CM

## 2012-07-26 MED ORDER — DICLOFENAC SODIUM 75 MG PO TBEC: 75.0000 mg | DELAYED_RELEASE_TABLET | Freq: Two times a day (BID) | ORAL | Status: DC | PRN

## 2012-07-26 NOTE — Patient Instructions (Addendum)
Use the voltaren as needed.  Let me know if you are not better in the next few days.

## 2012-07-26 NOTE — Progress Notes (Signed)
[facility] [address], [place] [address] [phone]  Date:  07/26/2012   Name:  Margaret Green   DOB:  1961/08/13   MRN:  [id number]  PCP:  Carrie Mew, MD    Chief Complaint: Rib Injury   History of Present Illness:  Margaret Green is a 51 y.o. very pleasant female patient who presents with the following:  This past Monday she was at the hospital sitting with her dad who was ill.  She reached over an arm- chair to get something on the floor and the left side of her rib- cage hit the arm of the chair and "I felt something pop."  It hurt some over the last 2 days but got worse today.   If she breaths deeply, moves her rib cage, coughs or sneezes she has a lot of pain.  She was concerned and wanted to be sure everything is ok.   No history of any cardiac problems.   No cough or fever.   Her father was discharged from the hospital and is doing better now.    Patient Active Problem List  Diagnosis  . HYPOTHYROIDISM  . HYPERLIPIDEMIA  . ANEMIA, PERNICIOUS  . NEUTROPENIA UNSPECIFIED  . RAYNAUD'S DISEASE  . DYSPEPSIA  . ENDOMETRIOSIS  . MENOPAUSE  . OSTEOARTHRITIS  . FIBROMYALGIA  . WEIGHT GAIN, ABNORMAL  . HEADACHE  . NAUSEA  . HEARTBURN  . CHANGE IN BOWELS  . Abdominal pain, generalized  . ABDOMINAL PAIN-MULTIPLE SITES  . ELEVATED BLOOD PRESSURE WITHOUT DIAGNOSIS OF HYPERTENSION  . SPRAIN/STRAIN, LUMBAR REGION  . ANXIETY  . ESOPHAGEAL STRICTURE  . CHOLELITHIASIS  . DIARRHEA    Past Medical History  Diagnosis Date  . Anxiety   . Stricture and stenosis of esophagus   . Elevated blood pressure reading without diagnosis of hypertension   . Dyspepsia   . Endometriosis, site unspecified   . GERD (gastroesophageal reflux disease)   . Neutropenia   . Other symptoms involving digestive system   . Abdominal  pain, other specified site   . Allergy   . Abnormal weight gain   . Symptomatic menopausal or female climacteric  states   . Myalgia and myositis, unspecified   . Arthritis   . Raynaud's syndrome   . Hyperlipidemia   . Hypothyroidism   . Headache   . Family history of diabetes mellitus     Past Surgical History  Procedure Date  . Abdominal hysterectomy   . Hernia repair   . Breast surgery     biopsy  . Laparoscopy   . Elevated lipis     History  Substance Use Topics  . Smoking status: Never Smoker   . Smokeless tobacco: Never Used  . Alcohol Use: No    Family History  Problem Relation Age of Onset  . Coronary artery disease    . Ovarian cancer    . Stomach cancer    . Cirrhosis Mother   . Diabetes Mother   . Heart disease Father   . Colon cancer Neg Hx     Allergies  Allergen Reactions  . Codeine   . Meloxicam     REACTION: rash: itching  . Oxycodone-Aspirin     REACTION: rash: itching  . Sulfamethoxazole     REACTION: rash: itching    Medication list has been reviewed and updated.  Current Outpatient Prescriptions on File Prior to Visit  Medication Sig Dispense Refill  . ACIPHEX 20  MG tablet take 1 tablet by mouth once daily  30 tablet  2  . ALPRAZolam (XANAX) 0.25 MG tablet TAKE 1 TABLET BY MOUTH THREE TIMES DAILY if needed  60 tablet  2  . butalbital-acetaminophen-caffeine (FIORICET WITH CODEINE) 50-325-40-30 MG per capsule Take 1 capsule by mouth every 4 (four) hours as needed.        . butalbital-aspirin-caffeine (FIORINAL) 50-325-40 MG per capsule take 1 capsule by mouth every 4 hours if needed  60 capsule  1  . Calcium Carb-Cholecalciferol 500-400 MG-UNIT CHEW Chew 500 mg by mouth daily.        . cetirizine (ZYRTEC) 10 MG tablet Take 10 mg by mouth daily.        . diphenoxylate-atropine (LOMOTIL) 2.5-0.025 MG per tablet Take 1 tablet by mouth 4 (four) times daily as needed.        . fluticasone (FLONASE) 50 MCG/ACT nasal spray USE AS DIRECTED BY PRESCRIBER  16 g  10  . LIPITOR 20 MG tablet take 1 tablet by mouth every evening  30 tablet  11  . Milnacipran  (SAVELLA) 50 MG TABS Take 1 tablet (50 mg total) by mouth 2 (two) times daily.  60 tablet  11  . promethazine (PHENERGAN) 25 MG tablet Take 25 mg by mouth every 6 (six) hours as needed.        . SUMAtriptan (IMITREX) 100 MG tablet take as directed if needed  6 tablet  6  . SYNTHROID 100 MCG tablet TAKE 1 TABLET BY MOUTH ONCE DAILY  30 tablet  10  . tiZANidine (ZANAFLEX) 4 MG tablet TAKE 1 TABLET BY MOUTH TWICE DAILY AS NEEDED  60 tablet  5  . verapamil (CALAN-SR) 120 MG CR tablet take 1 tablet by mouth every evening  30 tablet  11  . clonazePAM (KLONOPIN) 0.5 MG tablet Take 0.5 tablets (0.25 mg total) by mouth 2 (two) times daily.  30 tablet  3  . diclofenac (VOLTAREN) 75 MG EC tablet TAKE 1 TABLET BY MOUTH 2 TIMES A DAY AS NEEDED FOR PAIN  60 tablet  1  . doxycycline (VIBRA-TABS) 100 MG tablet Take 1 tablet (100 mg total) by mouth 2 (two) times daily.  20 tablet  0  . phentermine 37.5 MG capsule Take 1 capsule (37.5 mg total) by mouth every morning.  30 capsule  3  . zolpidem (AMBIEN) 10 MG tablet Take 10 mg by mouth at bedtime as needed.          Review of Systems:  As per HPI- otherwise negative.   Physical Examination: Filed Vitals:   07/26/12 1537  BP: 146/88  Pulse: 78  Temp: 97.8 F (36.6 C)  Resp: 16   Filed Vitals:   07/26/12 1537  Height: 5\' 5"  (1.651 m)  Weight: 155 lb (70.308 kg)   Body mass index is 25.79 kg/(m^2). Ideal Body Weight: Weight in (lb) to have BMI = 25: 149.9   GEN: WDWN, NAD, Non-toxic, A & O x 3 HEENT: Atraumatic, Normocephalic. Neck supple. No masses, No LAD. Ears and Nose: No external deformity. CV: RRR, No M/G/R. No JVD. No thrill. No extra heart sounds. PULM: CTA B, no wheezes, crackles, rhonchi. No retractions. No resp. distress. No accessory muscle use. ABD: S, NT, ND, +BS. No rebound. No HSM. Ribs; she has tenderness over her left ribs underneath the left breast.  Her tenderness is obviously reproducible and is MSK in origin.  There is no  rash or lesion to suggest  shingles  EXTR: No c/c/e NEURO Normal gait.  PSYCH: Normally interactive. Conversant. Not depressed or anxious appearing.  Calm demeanor.   [facility] reading (PRIMARY) by  Dr. Patsy Lager. Left ribs: negative for fracture or other acute disease  Clinical Data: Pain post injury few days ago  LEFT RIBS AND CHEST - 3+ VIEW  Comparison: Preliminary reading Dr. Patsy Lager  Findings: Four views left hip submitted. Cardiomediastinal silhouette is unremarkable. No acute infiltrate or pulmonary edema.  No left rib fracture is identified.             Impression          No acute disease. No left rib fracture is identified.          Assessment and Plan: 1. Rib pain  DG Ribs Unilateral W/Chest Left, diclofenac (VOLTAREN) 75 MG EC tablet   MSK pain in her left ribs.  reassured that no fracture is evident.   Refilled her voltaren rx- she has used this with success in the past.  See patient instructions for more details.    Of note she has a history of meloxicam allergy but she has used voltaren without any problems  COPLAND,JESSICA, MD

## 2012-07-30 ENCOUNTER — Encounter: Payer: Self-pay | Admitting: Internal Medicine

## 2012-07-30 ENCOUNTER — Ambulatory Visit (INDEPENDENT_AMBULATORY_CARE_PROVIDER_SITE_OTHER): Payer: 59 | Admitting: Internal Medicine

## 2012-07-30 VITALS — BP 130/80 | HR 72 | Temp 98.6°F | Resp 16 | Ht 65.0 in | Wt 154.0 lb

## 2012-07-30 DIAGNOSIS — K589 Irritable bowel syndrome without diarrhea: Secondary | ICD-10-CM

## 2012-07-30 DIAGNOSIS — M797 Fibromyalgia: Secondary | ICD-10-CM

## 2012-07-30 DIAGNOSIS — F419 Anxiety disorder, unspecified: Secondary | ICD-10-CM

## 2012-07-30 DIAGNOSIS — F341 Dysthymic disorder: Secondary | ICD-10-CM

## 2012-07-30 DIAGNOSIS — IMO0001 Reserved for inherently not codable concepts without codable children: Secondary | ICD-10-CM

## 2012-07-30 DIAGNOSIS — F32A Depression, unspecified: Secondary | ICD-10-CM

## 2012-07-30 MED ORDER — CLONAZEPAM 0.5 MG PO TABS: 0.5000 mg | ORAL_TABLET | Freq: Two times a day (BID) | ORAL | Status: DC

## 2012-07-30 NOTE — Progress Notes (Signed)
  Subjective:    Patient ID: Margaret Green, female    DOB: May 16, 1961, 51 y.o.   MRN: [id number]  HPI Patient presents for followup of gastroesophageal reflux osteoarthritis history of headaches.  She has a stable blood pressure today she is a seasonal change in her headache pattern but not out of the ordinary.  Weight is actually reduce several pounds and stable in pattern. IBS is stable Fibromyalgia stable   Review of Systems  Constitutional: Negative for activity change, appetite change and fatigue.  HENT: Negative for ear pain, congestion, neck pain, postnasal drip and sinus pressure.   Eyes: Negative for redness and visual disturbance.  Respiratory: Negative for cough, shortness of breath and wheezing.   Gastrointestinal: Negative for abdominal pain and abdominal distention.  Genitourinary: Negative for dysuria, frequency and menstrual problem.  Musculoskeletal: Negative for myalgias, joint swelling and arthralgias.  Skin: Negative for rash and wound.  Neurological: Negative for dizziness, weakness and headaches.  Hematological: Negative for adenopathy. Does not bruise/bleed easily.  Psychiatric/Behavioral: Negative for sleep disturbance and decreased concentration.       Objective:   Physical Exam  Nursing note reviewed. Constitutional: She is oriented to person, place, and time. She appears well-developed and well-nourished. No distress.  HENT:  Head: Normocephalic and atraumatic.  Right Ear: External ear normal.  Left Ear: External ear normal.  Nose: Nose normal.  Mouth/Throat: Oropharynx is clear and moist.  Eyes: Conjunctivae normal and EOM are normal. Pupils are equal, round, and reactive to light.  Neck: Normal range of motion. Neck supple. No JVD present. No tracheal deviation present. No thyromegaly present.  Cardiovascular: Normal rate, regular rhythm, normal heart sounds and intact distal pulses.   No murmur heard. Pulmonary/Chest: Effort normal and  breath sounds normal. She has no wheezes. She exhibits no tenderness.  Abdominal: Soft. Bowel sounds are normal.  Musculoskeletal: Normal range of motion. She exhibits no edema and no tenderness.  Lymphadenopathy:    She has no cervical adenopathy.  Neurological: She is alert and oriented to person, place, and time. She has normal reflexes. No cranial nerve deficit.  Skin: Skin is warm and dry. She is not diaphoretic.  Psychiatric: She has a normal mood and affect. Her behavior is normal.          Assessment & Plan:  Fibromyalgia stable IBS stable Lipids results reviewed Head hx stable Flushing resolved

## 2012-07-30 NOTE — Patient Instructions (Signed)
Begin by taking a little clonazepam at bedtime and a half in the day if you need to take an extra half during the day you may Save the Xanax when you have an acute anxiety episode

## 2012-08-03 ENCOUNTER — Telehealth: Payer: Self-pay | Admitting: Internal Medicine

## 2012-08-03 MED ORDER — DIAZEPAM 5 MG PO TABS: 5.0000 mg | ORAL_TABLET | Freq: Three times a day (TID) | ORAL | Status: DC

## 2012-08-03 NOTE — Telephone Encounter (Signed)
Per dr Lovell Sheehan- may change xanax to valium 5 tid

## 2012-08-03 NOTE — Telephone Encounter (Signed)
Patient Information:  Caller Name: Tonni  Phone: [phone]  Patient: Margaret Green, Margaret Green  Gender: Female  DOB: [birth year]  Age: 51 Years  PCP: Darryll Capers (Adults only)  Pregnant: No   Symptoms  Reason For Call & Symptoms: Patient was in the office on Monday 07/30/12 for depression/Anxiety. Dr. Lovell Sheehan increased her Clonazepam  to 1/2 pill twice a day and 1 pill at night.. She has added in Xanax twice.   She is calling because she is tearful and is concerned she will get to work and be emotional.  Is there something else we can do? Medication addition or change.  Decrease sleep <5 hours.  Very tearful. Stress.  Reviewed Health History In EMR: Yes  Reviewed Medications In EMR: Yes  Reviewed Allergies In EMR: Yes  Date of Onset of Symptoms: 07/30/2012  Treatments Tried: Medications  Treatments Tried Worked: No OB:  LMP: Unknown  Guideline(s) Used:  Depression  Disposition Per Guideline:   Home Care  Reason For Disposition Reached:   Mild depression  Advice Given:  Reassurance:   People with depression do get through this -- even people who feel as badly as you feel now. You can be helped.  Encourage the caller to talk about his/her problems and feelings.  Offer hope.  Stay Active  Get out of your house or apartment periodically. Go on an outing with a family member or a friend. Go to the store. Go to a movie.  Take a daily walk.  Stay Active  Get out of your house or apartment periodically. Go on an outing with a family member or a friend. Go to the store. Go to a movie.  Start a new hobby.  Take a daily walk.  Call Back If:  Sadness or depression symptoms persist more than 2 weeks  You want to talk with a counselor  You feel like harming yourself  You become worse.  Office Follow Up:  Does the office need to follow up with this patient?: Yes  Instructions For The Office: Please contact patient . She will keep phone with her or leave message. Tearful  RN  Note:  she is very tearful.  Concerned that next week back at work and will be facing a new project. She is really not that much better and just thinks it may need medication adjustment.  she remembers that Valium worked for her in past before.  Is this something that would be more immediate help?   Dr. Lovell Sheehan had put her on it around 10 years ago briefly after family death.   Patient is tearful.

## 2012-08-03 NOTE — Telephone Encounter (Signed)
Pt informed and med called in 

## 2012-08-07 ENCOUNTER — Other Ambulatory Visit: Payer: Self-pay | Admitting: Internal Medicine

## 2012-08-07 MED ORDER — DIPHENOXYLATE-ATROPINE 2.5-0.025 MG PO TABS: 1.0000 | ORAL_TABLET | Freq: Four times a day (QID) | ORAL | Status: DC | PRN

## 2012-08-07 NOTE — Telephone Encounter (Signed)
Pt needs refill of LOMOTIL 2.5 mg 1 up to 4 /day. Pharm [facility] [address]

## 2012-10-17 ENCOUNTER — Other Ambulatory Visit: Payer: Self-pay | Admitting: Internal Medicine

## 2012-10-17 MED ORDER — ALPRAZOLAM 0.25 MG PO TABS: 0.2500 mg | ORAL_TABLET | Freq: Three times a day (TID) | ORAL | Status: DC | PRN

## 2012-10-17 NOTE — Telephone Encounter (Signed)
done

## 2012-10-17 NOTE — Telephone Encounter (Signed)
Pt would like a refill on ALPRAZolam (XANAX) 0.25 MG tablet. Pt is no longer taking the valium. Pharm: [facility]/ [address])

## 2012-10-23 ENCOUNTER — Other Ambulatory Visit: Payer: Self-pay | Admitting: *Deleted

## 2012-10-23 DIAGNOSIS — E669 Obesity, unspecified: Secondary | ICD-10-CM

## 2012-10-23 MED ORDER — PHENTERMINE HCL 37.5 MG PO CAPS: 37.5000 mg | ORAL_CAPSULE | ORAL | Status: DC

## 2012-10-27 ENCOUNTER — Other Ambulatory Visit: Payer: Self-pay

## 2012-11-05 ENCOUNTER — Ambulatory Visit: Payer: 59 | Admitting: Internal Medicine

## 2012-11-20 ENCOUNTER — Other Ambulatory Visit: Payer: Self-pay | Admitting: Internal Medicine

## 2012-12-12 ENCOUNTER — Other Ambulatory Visit: Payer: Self-pay | Admitting: Internal Medicine

## 2012-12-19 ENCOUNTER — Other Ambulatory Visit: Payer: Self-pay | Admitting: *Deleted

## 2012-12-19 DIAGNOSIS — K589 Irritable bowel syndrome without diarrhea: Secondary | ICD-10-CM

## 2012-12-19 DIAGNOSIS — M797 Fibromyalgia: Secondary | ICD-10-CM

## 2012-12-19 DIAGNOSIS — F329 Major depressive disorder, single episode, unspecified: Secondary | ICD-10-CM

## 2012-12-19 DIAGNOSIS — F419 Anxiety disorder, unspecified: Secondary | ICD-10-CM

## 2012-12-19 DIAGNOSIS — F32A Depression, unspecified: Secondary | ICD-10-CM

## 2012-12-19 MED ORDER — CLONAZEPAM 0.5 MG PO TABS: 0.5000 mg | ORAL_TABLET | Freq: Two times a day (BID) | ORAL | Status: DC

## 2012-12-21 ENCOUNTER — Ambulatory Visit (INDEPENDENT_AMBULATORY_CARE_PROVIDER_SITE_OTHER): Payer: 59 | Admitting: Internal Medicine

## 2012-12-21 ENCOUNTER — Encounter: Payer: Self-pay | Admitting: Internal Medicine

## 2012-12-21 VITALS — BP 134/80 | HR 72 | Temp 98.3°F | Resp 16 | Ht 65.0 in | Wt 154.0 lb

## 2012-12-21 DIAGNOSIS — F419 Anxiety disorder, unspecified: Secondary | ICD-10-CM

## 2012-12-21 DIAGNOSIS — N951 Menopausal and female climacteric states: Secondary | ICD-10-CM

## 2012-12-21 DIAGNOSIS — M797 Fibromyalgia: Secondary | ICD-10-CM

## 2012-12-21 DIAGNOSIS — F32A Depression, unspecified: Secondary | ICD-10-CM

## 2012-12-21 DIAGNOSIS — E785 Hyperlipidemia, unspecified: Secondary | ICD-10-CM

## 2012-12-21 DIAGNOSIS — IMO0001 Reserved for inherently not codable concepts without codable children: Secondary | ICD-10-CM

## 2012-12-21 DIAGNOSIS — K589 Irritable bowel syndrome without diarrhea: Secondary | ICD-10-CM

## 2012-12-21 DIAGNOSIS — F329 Major depressive disorder, single episode, unspecified: Secondary | ICD-10-CM

## 2012-12-21 DIAGNOSIS — F341 Dysthymic disorder: Secondary | ICD-10-CM

## 2012-12-21 DIAGNOSIS — E039 Hypothyroidism, unspecified: Secondary | ICD-10-CM

## 2012-12-21 MED ORDER — FLUTICASONE PROPIONATE 50 MCG/ACT NA SUSP: NASAL | Status: DC

## 2012-12-21 MED ORDER — CLONAZEPAM 0.5 MG PO TABS: 0.5000 mg | ORAL_TABLET | Freq: Two times a day (BID) | ORAL | Status: DC

## 2012-12-21 NOTE — Progress Notes (Signed)
Subjective:    Patient ID: Margaret Green, female    DOB: 09-12-1961, 52 y.o.   MRN: [id number]  HPI  Mild nausea with savela dose Fibromyalgia and depression Hx of GERD with stricture history No vomiting and no dysphagia   Review of Systems  Constitutional: Negative for activity change, appetite change and fatigue.  HENT: Negative for ear pain, congestion, neck pain, postnasal drip and sinus pressure.   Eyes: Negative for redness and visual disturbance.  Respiratory: Negative for cough, shortness of breath and wheezing.   Gastrointestinal: Negative for abdominal pain and abdominal distention.  Genitourinary: Negative for dysuria, frequency and menstrual problem.  Musculoskeletal: Negative for myalgias, joint swelling and arthralgias.  Skin: Negative for rash and wound.  Neurological: Negative for dizziness, weakness and headaches.  Hematological: Negative for adenopathy. Does not bruise/bleed easily.  Psychiatric/Behavioral: Negative for sleep disturbance and decreased concentration.   Past Medical History  Diagnosis Date  . Anxiety   . Stricture and stenosis of esophagus   . Elevated blood pressure reading without diagnosis of hypertension   . Dyspepsia   . Endometriosis, site unspecified   . GERD (gastroesophageal reflux disease)   . Neutropenia   . Other symptoms involving digestive system   . Abdominal  pain, other specified site   . Allergy   . Abnormal weight gain   . Symptomatic menopausal or female climacteric states   . Myalgia and myositis, unspecified   . Arthritis   . Raynaud's syndrome   . Hyperlipidemia   . Hypothyroidism   . Headache   . Family history of diabetes mellitus     History   Social History  . Marital Status: Single    Spouse Name: N/A    Number of Children: N/A  . Years of Education: N/A   Occupational History  . Not on file.   Social History Main Topics  . Smoking status: Never Smoker   . Smokeless tobacco: Never Used  .  Alcohol Use: No  . Drug Use: No  . Sexually Active: Yes   Other Topics Concern  . Not on file   Social History Narrative  . No narrative on file    Past Surgical History  Procedure Laterality Date  . Abdominal hysterectomy    . Hernia repair    . Breast surgery      biopsy  . Laparoscopy    . Elevated lipis      Family History  Problem Relation Age of Onset  . Coronary artery disease    . Ovarian cancer    . Stomach cancer    . Cirrhosis Mother   . Diabetes Mother   . Heart disease Father   . Colon cancer Neg Hx     Allergies  Allergen Reactions  . Codeine   . Meloxicam     REACTION: rash: itching  . Oxycodone-Aspirin     REACTION: rash: itching  . Sulfamethoxazole     REACTION: rash: itching    Current Outpatient Prescriptions on File Prior to Visit  Medication Sig Dispense Refill  . ACIPHEX 20 MG tablet take 1 tablet by mouth once daily  30 tablet  2  . butalbital-acetaminophen-caffeine (FIORICET WITH CODEINE) 50-325-40-30 MG per capsule Take 1 capsule by mouth every 4 (four) hours as needed.        . butalbital-aspirin-caffeine (FIORINAL) 50-325-40 MG per capsule take 1 capsule by mouth every 4 hours if needed  60 capsule  1  . Calcium Carb-Cholecalciferol 500-400 MG-UNIT  CHEW Chew 500 mg by mouth daily.        . cetirizine (ZYRTEC) 10 MG tablet Take 10 mg by mouth daily.        . diclofenac (VOLTAREN) 75 MG EC tablet Take 1 tablet (75 mg total) by mouth 2 (two) times daily as needed.  60 tablet  0  . diphenoxylate-atropine (LOMOTIL) 2.5-0.025 MG per tablet Take 1 tablet by mouth 4 (four) times daily as needed.  30 tablet  0  . LIPITOR 20 MG tablet take 1 tablet by mouth every evening  30 tablet  11  . Milnacipran (SAVELLA) 50 MG TABS Take 1 tablet (50 mg total) by mouth 2 (two) times daily.  60 tablet  11  . phentermine 37.5 MG capsule Take 1 capsule (37.5 mg total) by mouth every morning.  30 capsule  3  . promethazine (PHENERGAN) 25 MG tablet Take 25 mg by  mouth every 6 (six) hours as needed.        . SUMAtriptan (IMITREX) 100 MG tablet take as directed if needed  6 tablet  6  . SYNTHROID 100 MCG tablet take 1 tablet by mouth once daily  30 tablet  10  . tiZANidine (ZANAFLEX) 4 MG tablet TAKE 1 TABLET BY MOUTH TWICE DAILY AS NEEDED  60 tablet  5  . verapamil (CALAN-SR) 120 MG CR tablet take 1 tablet by mouth every evening  30 tablet  11  . zolpidem (AMBIEN) 10 MG tablet Take 10 mg by mouth at bedtime as needed.         No current facility-administered medications on file prior to visit.    BP 134/80  Pulse 72  Temp(Src) 98.3 F (36.8 C)  Resp 16  Ht 5\' 5"  (1.651 m)  Wt 154 lb (69.854 kg)  BMI 25.63 kg/m2       Objective:   Physical Exam  Nursing note and vitals reviewed. Constitutional: She is oriented to person, place, and time. She appears well-developed and well-nourished. No distress.  HENT:  Head: Normocephalic and atraumatic.  Right Ear: External ear normal.  Left Ear: External ear normal.  Nose: Nose normal.  Mouth/Throat: Oropharynx is clear and moist.  Eyes: Conjunctivae and EOM are normal. Pupils are equal, round, and reactive to light.  Neck: Normal range of motion. Neck supple. No JVD present. No tracheal deviation present. No thyromegaly present.  Cardiovascular: Normal rate, regular rhythm, normal heart sounds and intact distal pulses.   No murmur heard. Pulmonary/Chest: Effort normal and breath sounds normal. She has no wheezes. She exhibits no tenderness.  Abdominal: Soft. Bowel sounds are normal.  Musculoskeletal: Normal range of motion. She exhibits no edema and no tenderness.  Lymphadenopathy:    She has no cervical adenopathy.  Neurological: She is alert and oriented to person, place, and time. She has normal reflexes. No cranial nerve deficit.  Skin: Skin is warm and dry. She is not diaphoretic.  Psychiatric: She has a normal mood and affect. Her behavior is normal.          Assessment & Plan:   Nausea from  The savela due to dose increase Change to 25 mg TID and then BID to see if symptoms are dose related Stable mood and feeling better after dealing with her loss of relationship  I have spent more than 30 minutes examining this patient face-to-face of which over half was spent in counseling

## 2012-12-21 NOTE — Patient Instructions (Addendum)
Cut the savella to 1/2  Three times a day to start and then twice a day if tolerated Take 500- 600 mg of calcium with trace mineral twice a day and  Vitamin d 2000 iu daily

## 2013-02-19 ENCOUNTER — Other Ambulatory Visit: Payer: Self-pay | Admitting: Internal Medicine

## 2013-03-08 ENCOUNTER — Ambulatory Visit: Payer: 59 | Admitting: Internal Medicine

## 2013-05-03 ENCOUNTER — Ambulatory Visit: Payer: 59 | Admitting: Internal Medicine

## 2013-06-01 ENCOUNTER — Encounter (HOSPITAL_COMMUNITY): Payer: Self-pay | Admitting: *Deleted

## 2013-06-01 ENCOUNTER — Emergency Department (HOSPITAL_COMMUNITY)
Admission: EM | Admit: 2013-06-01 | Discharge: 2013-06-02 | Disposition: A | Discharge status: Home / Self Care | Payer: 59 | Attending: Emergency Medicine | Admitting: Emergency Medicine

## 2013-06-01 DIAGNOSIS — F411 Generalized anxiety disorder: Secondary | ICD-10-CM | POA: Insufficient documentation

## 2013-06-01 DIAGNOSIS — M129 Arthropathy, unspecified: Secondary | ICD-10-CM | POA: Insufficient documentation

## 2013-06-01 DIAGNOSIS — E785 Hyperlipidemia, unspecified: Secondary | ICD-10-CM | POA: Insufficient documentation

## 2013-06-01 DIAGNOSIS — Y929 Unspecified place or not applicable: Secondary | ICD-10-CM | POA: Insufficient documentation

## 2013-06-01 DIAGNOSIS — R51 Headache: Secondary | ICD-10-CM | POA: Insufficient documentation

## 2013-06-01 DIAGNOSIS — Z862 Personal history of diseases of the blood and blood-forming organs and certain disorders involving the immune mechanism: Secondary | ICD-10-CM | POA: Insufficient documentation

## 2013-06-01 DIAGNOSIS — E039 Hypothyroidism, unspecified: Secondary | ICD-10-CM | POA: Insufficient documentation

## 2013-06-01 DIAGNOSIS — S8990XA Unspecified injury of unspecified lower leg, initial encounter: Secondary | ICD-10-CM | POA: Insufficient documentation

## 2013-06-01 DIAGNOSIS — Z8719 Personal history of other diseases of the digestive system: Secondary | ICD-10-CM | POA: Insufficient documentation

## 2013-06-01 DIAGNOSIS — Z8679 Personal history of other diseases of the circulatory system: Secondary | ICD-10-CM | POA: Insufficient documentation

## 2013-06-01 DIAGNOSIS — K219 Gastro-esophageal reflux disease without esophagitis: Secondary | ICD-10-CM | POA: Insufficient documentation

## 2013-06-01 DIAGNOSIS — Y939 Activity, unspecified: Secondary | ICD-10-CM | POA: Insufficient documentation

## 2013-06-01 DIAGNOSIS — W010XXA Fall on same level from slipping, tripping and stumbling without subsequent striking against object, initial encounter: Secondary | ICD-10-CM | POA: Insufficient documentation

## 2013-06-01 DIAGNOSIS — R635 Abnormal weight gain: Secondary | ICD-10-CM | POA: Insufficient documentation

## 2013-06-01 DIAGNOSIS — S99919A Unspecified injury of unspecified ankle, initial encounter: Secondary | ICD-10-CM | POA: Insufficient documentation

## 2013-06-01 DIAGNOSIS — Z79899 Other long term (current) drug therapy: Secondary | ICD-10-CM | POA: Insufficient documentation

## 2013-06-01 DIAGNOSIS — S99912A Unspecified injury of left ankle, initial encounter: Secondary | ICD-10-CM

## 2013-06-01 NOTE — ED Notes (Addendum)
Pt tripped over golf bag and hit her ankle on the bag. Pt states no twisting or turning of the ankle just swelling from where the bag hit on her left ankle. Ice applied. Pt denies pain.

## 2013-06-02 ENCOUNTER — Emergency Department (HOSPITAL_COMMUNITY): Payer: 59

## 2013-06-02 NOTE — ED Provider Notes (Signed)
CSN: [id number]     Arrival date & time 06/01/13  2304 History   First MD Initiated Contact with Patient 06/01/13 2309     Chief Complaint  Patient presents with  . Ankle Pain   (Consider location/radiation/quality/duration/timing/severity/associated sxs/prior Treatment) HPI Comments: Patient is a 52 year old female who presents with left ankle pain that started after an injury that occurred a few hours ago. Patient reports tripping over a golf bag. Patient reports gradual onset of aching, moderate pain that is localized to left ankle that started after the incident. Patient reports constant mild to moderate pain. Ankle movement and weight bearing activity make the pain worse. Nothing makes the pain better. Patient reports associated swelling. Patient has not tried anything for pain relief. Patient denies obvious deformity, numbness/tingling, coolness/weakness of extremity, bruising, and any other injury.     Patient is a 52 y.o. female presenting with ankle pain.  Ankle Pain   Past Medical History  Diagnosis Date  . Anxiety   . Stricture and stenosis of esophagus   . Elevated blood pressure reading without diagnosis of hypertension   . Dyspepsia   . Endometriosis, site unspecified   . GERD (gastroesophageal reflux disease)   . Neutropenia   . Other symptoms involving digestive system(787.99)   . Abdominal  pain, other specified site   . Allergy   . Abnormal weight gain   . Symptomatic menopausal or female climacteric states   . Myalgia and myositis, unspecified   . Arthritis   . Raynaud's syndrome   . Hyperlipidemia   . Hypothyroidism   . Headache(784.0)   . Family history of diabetes mellitus    Past Surgical History  Procedure Laterality Date  . Abdominal hysterectomy    . Hernia repair    . Breast surgery      biopsy  . Laparoscopy    . Elevated lipis     Family History  Problem Relation Age of Onset  . Coronary artery disease    . Ovarian cancer    . Stomach  cancer    . Cirrhosis Mother   . Diabetes Mother   . Heart disease Father   . Colon cancer Neg Hx    History  Substance Use Topics  . Smoking status: Never Smoker   . Smokeless tobacco: Never Used  . Alcohol Use: No   OB History   Grav Para Term Preterm Abortions TAB SAB Ect Mult Living                 Review of Systems  Musculoskeletal: Positive for joint swelling and arthralgias.  All other systems reviewed and are negative.    Allergies  Codeine; Meloxicam; Oxycodone-aspirin; and Sulfamethoxazole  Home Medications   Current Outpatient Rx  Name  Route  Sig  Dispense  Refill  . ACIPHEX 20 MG tablet      take 1 tablet by mouth once daily   30 tablet   2   . butalbital-acetaminophen-caffeine (FIORICET WITH CODEINE) 50-325-40-30 MG per capsule   Oral   Take 1 capsule by mouth every 4 (four) hours as needed.           . butalbital-aspirin-caffeine (FIORINAL) 50-325-40 MG per capsule      take 1 capsule by mouth every 4 hours if needed   60 capsule   2   . Calcium Carb-Cholecalciferol 500-400 MG-UNIT CHEW   Oral   Chew 500 mg by mouth daily.           Marland Kitchen  cetirizine (ZYRTEC) 10 MG tablet   Oral   Take 10 mg by mouth daily.           . clonazePAM (KLONOPIN) 0.5 MG tablet   Oral   Take 1 tablet (0.5 mg total) by mouth 2 (two) times daily.   60 tablet   5   . diclofenac (VOLTAREN) 75 MG EC tablet   Oral   Take 1 tablet (75 mg total) by mouth 2 (two) times daily as needed.   60 tablet   0   . diphenoxylate-atropine (LOMOTIL) 2.5-0.025 MG per tablet   Oral   Take 1 tablet by mouth 4 (four) times daily as needed.   30 tablet   0   . fluticasone (FLONASE) 50 MCG/ACT nasal spray      USE AS DIRECTED BY PRESCRIBER   16 g   10     Refill Approved   . fluticasone (FLONASE) 50 MCG/ACT nasal spray      USE AS DIRECTED BY PRESCRIBER   16 g   10     Refill Approved   . LIPITOR 20 MG tablet      take 1 tablet by mouth every evening   30  tablet   11   . Milnacipran (SAVELLA) 50 MG TABS   Oral   Take 1 tablet (50 mg total) by mouth 2 (two) times daily.   60 tablet   11   . phentermine 37.5 MG capsule   Oral   Take 1 capsule (37.5 mg total) by mouth every morning.   30 capsule   3   . promethazine (PHENERGAN) 25 MG tablet   Oral   Take 25 mg by mouth every 6 (six) hours as needed.           . SUMAtriptan (IMITREX) 100 MG tablet      take as directed if needed   6 tablet   6   . SYNTHROID 100 MCG tablet      take 1 tablet by mouth once daily   30 tablet   10   . tiZANidine (ZANAFLEX) 4 MG tablet      TAKE 1 TABLET BY MOUTH TWICE DAILY AS NEEDED   60 tablet   5   . verapamil (CALAN-SR) 120 MG CR tablet      take 1 tablet by mouth every evening   30 tablet   11   . zolpidem (AMBIEN) 10 MG tablet   Oral   Take 10 mg by mouth at bedtime as needed.            BP 159/82  Pulse 77  Temp(Src) 98.4 F (36.9 C) (Oral)  Resp 16  SpO2 98% Physical Exam  Nursing note and vitals reviewed. Constitutional: She is oriented to person, place, and time. She appears well-developed and well-nourished. No distress.  HENT:  Head: Normocephalic and atraumatic.  Eyes: Conjunctivae and EOM are normal.  Neck: Normal range of motion.  Cardiovascular: Normal rate, regular rhythm and intact distal pulses.  Exam reveals no gallop and no friction rub.   No murmur heard. Sufficient capillary refill of left foot.   Pulmonary/Chest: Effort normal and breath sounds normal. She has no wheezes. She has no rales. She exhibits no tenderness.  Musculoskeletal:  Left ankle ROM slightly limited due to edema. Anterior left ankle edema and tenderness to palpation. No obvious deformity.   Neurological: She is alert and oriented to person, place, and time. Coordination normal.  Sensation  of left foot intact. Speech is goal-oriented. Moves limbs without ataxia.   Skin: Skin is warm and dry.  Psychiatric: She has a normal mood  and affect. Her behavior is normal.    ED Course  Procedures (including critical care time) Labs Review Labs Reviewed - No data to display Imaging Review Dg Ankle Complete Left  06/02/2013   CLINICAL DATA:  Pain after fall.  EXAM: LEFT ANKLE COMPLETE - 3+ VIEW  COMPARISON:  None.  FINDINGS: There is no evidence of fracture, dislocation, or joint effusion. There is no evidence of arthropathy or other focal bone abnormality. Soft tissues are unremarkable. Ankle mortise intact.  IMPRESSION: Negative.   Electronically Signed   By: Oley Balm M.D.   On: 06/02/2013 01:00    MDM   1. Left ankle injury, initial encounter     12:20 AM Left ankle xray pending. No neurovascular compromise. Patient refuses pain medication at this time. Vitals stable and patient afebrile.   1:04 AM Xray unremarkable for acute fracture or dislocation. Patient will be discharged without further evaluation.   Emilia Beck, PA-C 06/02/13 0106

## 2013-06-03 NOTE — ED Provider Notes (Signed)
Medical screening examination/treatment/procedure(s) were performed by non-physician practitioner and as supervising physician I was immediately available for consultation/collaboration.   Biannca Scantlin W Nyheem Binette, MD 06/03/13 0052 

## 2013-06-17 ENCOUNTER — Other Ambulatory Visit: Payer: Self-pay | Admitting: Internal Medicine

## 2013-06-20 ENCOUNTER — Ambulatory Visit: Payer: 59

## 2013-06-20 ENCOUNTER — Ambulatory Visit (INDEPENDENT_AMBULATORY_CARE_PROVIDER_SITE_OTHER): Payer: 59 | Admitting: Emergency Medicine

## 2013-06-20 ENCOUNTER — Other Ambulatory Visit: Payer: Self-pay | Admitting: Emergency Medicine

## 2013-06-20 VITALS — BP 144/84 | HR 93 | Temp 99.0°F | Resp 17 | Ht 65.0 in | Wt 158.0 lb

## 2013-06-20 DIAGNOSIS — S139XXA Sprain of joints and ligaments of unspecified parts of neck, initial encounter: Secondary | ICD-10-CM

## 2013-06-20 DIAGNOSIS — M25579 Pain in unspecified ankle and joints of unspecified foot: Secondary | ICD-10-CM

## 2013-06-20 DIAGNOSIS — S161XXA Strain of muscle, fascia and tendon at neck level, initial encounter: Secondary | ICD-10-CM

## 2013-06-20 DIAGNOSIS — M549 Dorsalgia, unspecified: Secondary | ICD-10-CM

## 2013-06-20 DIAGNOSIS — M25571 Pain in right ankle and joints of right foot: Secondary | ICD-10-CM

## 2013-06-20 MED ORDER — CYCLOBENZAPRINE HCL 10 MG PO TABS: 10.0000 mg | ORAL_TABLET | Freq: Three times a day (TID) | ORAL | Status: DC | PRN

## 2013-06-20 MED ORDER — TRAMADOL HCL 50 MG PO TABS: 50.0000 mg | ORAL_TABLET | Freq: Four times a day (QID) | ORAL | Status: DC | PRN

## 2013-06-20 NOTE — Patient Instructions (Signed)
Cervical Strain Care After A cervical strain is when the muscles and ligaments in your neck have been stretched. The bones are not broken. If you had any problems moving your arms or legs immediately after the injury, even if the problem has gone away, make sure to tell this to your caregiver.  HOME CARE INSTRUCTIONS   While awake, apply ice packs to the neck or areas of pain about every 1 to 2 hours, for 15 to 20 minutes at a time. Do this for 2 days. If you were given a cervical collar for support, ask your caregiver if you may remove it for bathing or applying ice.   If given a cervical collar, wear as instructed. Do not remove any collar unless instructed by a caregiver.   Only take over-the-counter or prescription medicines for pain, discomfort, or fever as directed by your caregiver.  Recheck with the hospital or clinic after a radiologist has read your X-rays. Recheck with the hospital or clinic to make sure the initial readings are correct. Do this also to determine if you need further studies. It is your responsibility to find out your X-ray results. X-rays are sometimes repeated in one week to ten days. These are often repeated to make sure that a hairline fracture was not overlooked. Ask your caregiver how you are to find out about your radiology (X-ray) results. SEEK IMMEDIATE MEDICAL CARE IF:   You have increasing pain in your neck.   You develop difficulties swallowing or breathing.   You have numbness, weakness, or movement problems in the arms or legs.   You have difficulty walking.   You develop bowel or bladder retention or incontinence.   You have problems with walking.  MAKE SURE YOU:   Understand these instructions.   Will watch your condition.   Will get help right away if you are not doing well or get worse.  Document Released: 08/29/2005 Document Revised: 05/11/2011 Document Reviewed: 04/11/2008 [facility] Patient Information 2012 [place], [place].
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
## 2013-07-18 ENCOUNTER — Other Ambulatory Visit: Payer: Self-pay

## 2013-08-13 ENCOUNTER — Encounter: Payer: Self-pay | Admitting: *Deleted

## 2013-08-14 ENCOUNTER — Ambulatory Visit (INDEPENDENT_AMBULATORY_CARE_PROVIDER_SITE_OTHER): Payer: 59 | Admitting: Internal Medicine

## 2013-08-14 ENCOUNTER — Encounter: Payer: Self-pay | Admitting: Internal Medicine

## 2013-08-14 VITALS — BP 140/80 | HR 76 | Temp 98.0°F | Resp 16 | Ht 65.0 in | Wt 156.0 lb

## 2013-08-14 DIAGNOSIS — E785 Hyperlipidemia, unspecified: Secondary | ICD-10-CM

## 2013-08-14 DIAGNOSIS — T887XXA Unspecified adverse effect of drug or medicament, initial encounter: Secondary | ICD-10-CM

## 2013-08-14 DIAGNOSIS — E039 Hypothyroidism, unspecified: Secondary | ICD-10-CM

## 2013-08-14 LAB — TSH: TSH: 0.1 u[IU]/mL — ABNORMAL LOW (ref 0.35–5.50)

## 2013-08-14 LAB — LIPID PANEL
Total CHOL/HDL Ratio: 7
Triglycerides: 187 mg/dL — ABNORMAL HIGH (ref 0.0–149.0)
VLDL: 37.4 mg/dL (ref 0.0–40.0)

## 2013-08-14 LAB — CBC WITH DIFFERENTIAL/PLATELET
Basophils Relative: 0.4 % (ref 0.0–3.0)
Eosinophils Relative: 0.7 % (ref 0.0–5.0)
HCT: 42.8 % (ref 36.0–46.0)
Hemoglobin: 14.5 g/dL (ref 12.0–15.0)
Lymphocytes Relative: 43.1 % (ref 12.0–46.0)
Lymphs Abs: 2.2 10*3/uL (ref 0.7–4.0)
MCV: 92.7 fl (ref 78.0–100.0)
Monocytes Absolute: 0.5 10*3/uL (ref 0.1–1.0)
Monocytes Relative: 10.1 % (ref 3.0–12.0)
Neutro Abs: 2.3 10*3/uL (ref 1.4–7.7)
Neutrophils Relative %: 45.7 % (ref 43.0–77.0)
Platelets: 311 10*3/uL (ref 150.0–400.0)
RBC: 4.62 Mil/uL (ref 3.87–5.11)
RDW: 13.1 % (ref 11.5–14.6)
WBC: 5 10*3/uL (ref 4.5–10.5)

## 2013-08-14 LAB — BASIC METABOLIC PANEL
BUN: 8 mg/dL (ref 6–23)
CO2: 29 mEq/L (ref 19–32)
Calcium: 9.4 mg/dL (ref 8.4–10.5)
Chloride: 104 mEq/L (ref 96–112)
Creatinine, Ser: 0.7 mg/dL (ref 0.4–1.2)
Glucose, Bld: 83 mg/dL (ref 70–99)
Potassium: 4.5 mEq/L (ref 3.5–5.1)
Sodium: 138 mEq/L (ref 135–145)

## 2013-08-14 LAB — LDL CHOLESTEROL, DIRECT: Direct LDL: 181.2 mg/dL

## 2013-08-14 NOTE — Progress Notes (Signed)
Pre visit review using our clinic review tool, if applicable. No additional management support is needed unless otherwise documented below in the visit note. 
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
## 2013-08-14 NOTE — Patient Instructions (Signed)
The patient is instructed to continue all medications as prescribed. Schedule followup with check out clerk upon leaving the clinic  

## 2013-08-27 ENCOUNTER — Telehealth: Payer: Self-pay | Admitting: Internal Medicine

## 2013-08-27 NOTE — Telephone Encounter (Signed)
Patient Information:  Caller Name: T[place]  Phone: [phone]  Patient: Margaret Green, Margaret Green  Gender: Female  DOB: 1961-08-02  Age: 52 Years  PCP: Darryll Capers (Adults only)  Pregnant: No  Office Follow Up:  Does the office need to follow up with this patient?: Yes  Instructions For The Office: Pt is asking if MD thinks this is a flare up of her IBS could he prescribe something for her to see if it helps? or does he want further testing. (stool samples) since it is ongoing.   Symptoms  Reason For Call & Symptoms: Pt is still having stomach cramping within 1 1/2 hours of eating. Loose stools will follow this and she may have 3-4 stools. She breaks out in a sweat. Stools are runny/liquid /yellowish in colour. The cramping/pain only occurs prior to passing the bms then stops.   Pt was into see MD on 08/10/13 with this sx. Pt was to f/u and let the MD know if it continued. Pt tells RN that she has a previous diagnosis of IBS . She wants MD to f/u and let her know what she should do next. Pt also asking about her blood work. Rn checked results and saw some abnormal values which RN did not communicate with pt.  Reviewed Health History In EMR: Yes  Reviewed Medications In EMR: Yes  Reviewed Allergies In EMR: Yes  Reviewed Surgeries / Procedures: Yes  Date of Onset of Symptoms: 08/26/2013 OB / GYN:  LMP: Unknown  Guideline(s) Used:  Diarrhea  Disposition Per Guideline:   See Within 3 Days in Office  Reason For Disposition Reached:   Diarrhea persists > 7 days  Advice Given:  N/A  Patient Refused Recommendation:  Patient Requests Prescription  Pt uses [facility] [facility]

## 2013-08-27 NOTE — Telephone Encounter (Signed)
Patient advised it would be Wednesday when dr Lovell Sheehan return when he can see message.

## 2013-08-28 ENCOUNTER — Other Ambulatory Visit: Payer: Self-pay | Admitting: *Deleted

## 2013-08-28 MED ORDER — HYOSCYAMINE SULFATE ER 0.375 MG PO TB12: 0.3750 mg | ORAL_TABLET | Freq: Two times a day (BID) | ORAL | Status: DC

## 2013-08-28 MED ORDER — LEVOTHYROXINE SODIUM 75 MCG PO TABS: 75.0000 ug | ORAL_TABLET | Freq: Every day | ORAL | Status: DC

## 2013-08-28 NOTE — Telephone Encounter (Signed)
First lets back off on the synthroid to 75 Also schedule stool studies for c dif and o and P  Could try levbid BID  trial

## 2013-09-03 ENCOUNTER — Other Ambulatory Visit: Payer: Self-pay | Admitting: *Deleted

## 2013-09-03 DIAGNOSIS — R197 Diarrhea, unspecified: Secondary | ICD-10-CM

## 2013-09-09 ENCOUNTER — Other Ambulatory Visit (INDEPENDENT_AMBULATORY_CARE_PROVIDER_SITE_OTHER): Payer: 59

## 2013-09-09 DIAGNOSIS — R197 Diarrhea, unspecified: Secondary | ICD-10-CM

## 2013-09-10 LAB — OVA AND PARASITE SCREEN

## 2013-09-11 LAB — CLOSTRIDIUM DIFFICILE BY PCR: Toxigenic C. Difficile by PCR: NOT DETECTED

## 2013-09-13 ENCOUNTER — Other Ambulatory Visit: Payer: Self-pay | Admitting: Internal Medicine

## 2013-09-13 ENCOUNTER — Other Ambulatory Visit: Payer: Self-pay | Admitting: *Deleted

## 2013-09-17 ENCOUNTER — Encounter: Payer: Self-pay | Admitting: Internal Medicine

## 2013-09-18 ENCOUNTER — Telehealth: Payer: Self-pay | Admitting: *Deleted

## 2013-09-18 NOTE — Telephone Encounter (Signed)
Pt called and states lipids were>240 and you had stated you would start her on lipids med if that high.  Please advise. Pt aware it will be Friday before message is answered

## 2013-09-20 ENCOUNTER — Other Ambulatory Visit: Payer: Self-pay | Admitting: *Deleted

## 2013-09-20 MED ORDER — ROSUVASTATIN CALCIUM 20 MG PO TABS: 20.0000 mg | ORAL_TABLET | Freq: Every day | ORAL | Status: DC

## 2013-09-20 NOTE — Telephone Encounter (Signed)
Pt informed adn med sent in

## 2013-09-20 NOTE — Telephone Encounter (Signed)
Start her on crestor 20 mg one a week

## 2013-09-24 ENCOUNTER — Ambulatory Visit (INDEPENDENT_AMBULATORY_CARE_PROVIDER_SITE_OTHER): Payer: 59 | Admitting: Physician Assistant

## 2013-09-24 ENCOUNTER — Ambulatory Visit (INDEPENDENT_AMBULATORY_CARE_PROVIDER_SITE_OTHER): Payer: 59 | Admitting: Endocrinology

## 2013-09-24 ENCOUNTER — Encounter: Payer: Self-pay | Admitting: Endocrinology

## 2013-09-24 VITALS — BP 118/76 | HR 82 | Temp 98.0°F | Ht 64.0 in

## 2013-09-24 VITALS — BP 138/86 | HR 73 | Temp 99.1°F | Resp 16 | Ht 64.0 in | Wt 160.8 lb

## 2013-09-24 DIAGNOSIS — J40 Bronchitis, not specified as acute or chronic: Secondary | ICD-10-CM

## 2013-09-24 DIAGNOSIS — J029 Acute pharyngitis, unspecified: Secondary | ICD-10-CM

## 2013-09-24 DIAGNOSIS — E041 Nontoxic single thyroid nodule: Secondary | ICD-10-CM

## 2013-09-24 DIAGNOSIS — E039 Hypothyroidism, unspecified: Secondary | ICD-10-CM

## 2013-09-24 DIAGNOSIS — R059 Cough, unspecified: Secondary | ICD-10-CM

## 2013-09-24 DIAGNOSIS — R05 Cough: Secondary | ICD-10-CM

## 2013-09-24 LAB — T4, FREE: Free T4: 0.88 ng/dL (ref 0.60–1.60)

## 2013-09-24 LAB — POCT INFLUENZA A/B
Influenza A, POC: NEGATIVE
Influenza B, POC: NEGATIVE

## 2013-09-24 LAB — TSH: TSH: 1.3 u[IU]/mL (ref 0.35–5.50)

## 2013-09-24 LAB — T3, FREE: T3 FREE: 2.6 pg/mL (ref 2.3–4.2)

## 2013-09-24 LAB — POCT RAPID STREP A (OFFICE): Rapid Strep A Screen: NEGATIVE

## 2013-09-24 MED ORDER — PROMETHAZINE-DM 6.25-15 MG/5ML PO SYRP: 5.0000 mL | ORAL_SOLUTION | Freq: Four times a day (QID) | ORAL | Status: DC | PRN

## 2013-09-24 MED ORDER — AZITHROMYCIN 250 MG PO TABS: ORAL_TABLET | ORAL | Status: DC

## 2013-09-24 MED ORDER — IPRATROPIUM BROMIDE 0.06 % NA SOLN: 2.0000 | Freq: Three times a day (TID) | NASAL | Status: DC

## 2013-09-24 NOTE — Progress Notes (Signed)
Subjective:    Patient ID: Margaret Green, female    DOB: May 05, 1961, 53 y.o.   MRN: [id number]  HPI Pt reports hypothyroidism was dx'ed in 1982.  she has been on prescribed thyroid hormone therapy since then.  she has never taken non-prescribed thyroid hormone therapy.  she has never taken kelp or any other type of non-prescribed thyroid product.  He has never had thyroid imaging.  she has never had thyroid surgery, or XRT to the neck.  He has never been on amiodarone or lithium.  Over the past few years, the synthroid dosage has varied between 75 and 100 mcg/day.  She has taken the current 75 mcg/day, x approx 3-4 weeks.  She has 2 months of slight discomfort across the lower abdomen, and assoc diarrhea.   Past Medical History  Diagnosis Date  . Anxiety   . Stricture and stenosis of esophagus   . Elevated blood pressure reading without diagnosis of hypertension   . Dyspepsia   . Endometriosis, site unspecified   . GERD (gastroesophageal reflux disease)   . Neutropenia   . Other symptoms involving digestive system(787.99)   . Abdominal pain, other specified site   . Allergy   . Abnormal weight gain   . Symptomatic menopausal or female climacteric states   . Myalgia and myositis, unspecified   . Arthritis   . Raynaud's syndrome   . Hyperlipidemia   . Hypothyroidism   . Headache(784.0)   . Family history of diabetes mellitus     Past Surgical History  Procedure Laterality Date  . Abdominal hysterectomy    . Hernia repair    . Breast surgery      biopsy  . Laparoscopy    . Elevated lipis      History   Social History  . Marital Status: Single    Spouse Name: N/A    Number of Children: N/A  . Years of Education: N/A   Occupational History  . Not on file.   Social History Main Topics  . Smoking status: Never Smoker   . Smokeless tobacco: Never Used  . Alcohol Use: No  . Drug Use: No  . Sexual Activity: Yes   Other Topics Concern  . Not on file   Social  History Narrative  . No narrative on file    Current Outpatient Prescriptions on File Prior to Visit  Medication Sig Dispense Refill  . butalbital-aspirin-caffeine (FIORINAL) 50-325-40 MG per capsule take 1 capsule by mouth every 4 hours if needed  60 capsule  2  . Calcium Carb-Cholecalciferol 500-400 MG-UNIT CHEW Chew 500 mg by mouth daily.        . cetirizine (ZYRTEC) 10 MG tablet Take 10 mg by mouth daily.        . clonazePAM (KLONOPIN) 0.5 MG tablet take 1 tablet by mouth twice a day  60 tablet  5  . cyclobenzaprine (FLEXERIL) 10 MG tablet Take 1 tablet (10 mg total) by mouth 3 (three) times daily as needed for muscle spasms.  30 tablet  0  . diphenoxylate-atropine (LOMOTIL) 2.5-0.025 MG per tablet Take 1 tablet by mouth 4 (four) times daily as needed.  30 tablet  0  . fluticasone (FLONASE) 50 MCG/ACT nasal spray USE AS DIRECTED BY PRESCRIBER  16 g  10  . hyoscyamine (LEVBID) 0.375 MG 12 hr tablet Take 1 tablet (0.375 mg total) by mouth 2 (two) times daily.  60 tablet  1  . levothyroxine (SYNTHROID, LEVOTHROID) 75 MCG  tablet Take 1 tablet (75 mcg total) by mouth daily.  90 tablet  3  . promethazine (PHENERGAN) 25 MG tablet Take 25 mg by mouth every 6 (six) hours as needed.        . rosuvastatin (CRESTOR) 20 MG tablet Take 1 tablet (20 mg total) by mouth daily.  90 tablet  3  . SUMAtriptan (IMITREX) 100 MG tablet TAKE TABLETS BY MOUTH AS DIRECTED BY YOUR DOCTOR IF NEEDED  6 tablet  6   No current facility-administered medications on file prior to visit.    Allergies  Allergen Reactions  . Codeine   . Meloxicam     REACTION: rash: itching  . Oxycodone-Aspirin     REACTION: rash: itching  . Sulfamethoxazole     REACTION: rash: itching    Family History  Problem Relation Age of Onset  . Coronary artery disease    . Ovarian cancer    . Stomach cancer    . Cirrhosis Mother   . Diabetes Mother   . Heart disease Father   . Colon cancer Neg Hx   no thyroid problems  BP 118/76   Pulse 82  Temp(Src) 98 F (36.7 C) (Oral)  Ht 5\' 4"  (1.626 m)  SpO2 98%    Review of Systems denies depression, hair loss, cramps, sob, weight gain, memory loss, constipation, numbness, blurry vision, rhinorrhea, and syncope.  She has small "bumps" on her face."  She has chronic myalgias, and easy bruising.    Objective:   Physical Exam VS: see vs page GEN: no distress HEAD: head: no deformity eyes: no periorbital swelling, no proptosis external nose and ears are normal mouth: no lesion seen NECK: right thyroid nodule, 1-2 cm.   CHEST WALL: no deformity LUNGS:  Clear to auscultation CV: reg rate and rhythm, no murmur ABD: abdomen is soft, nontender.  no hepatosplenomegaly.  not distended.  no hernia MUSCULOSKELETAL: muscle bulk and strength are grossly normal.  no obvious joint swelling.  gait is normal and steady EXTEMITIES: no deformity.  no edema PULSES: no carotid bruit NEURO:  cn 2-12 grossly intact.   readily moves all 4's.  Sensation is intact to touch on all 4's.   SKIN:  Normal texture and temperature.  No rash or suspicious lesion is visible.   NODES:  None palpable at the neck PSYCH: alert, well-oriented.  Does not appear anxious nor depressed.  Lab Results  Component Value Date   TSH 1.30 09/24/2013      Assessment & Plan:  Chronic hypothyroidism, well-replaced Thyroid nodule, possibly due to chronic thyroiditis. Myalgias, and other sxs, not thyroid-related. Pernicious anemia, prob with the same autoimmune basis as hypothyroidism.

## 2013-09-24 NOTE — Patient Instructions (Signed)
blood tests are being requested for you today.  We'll contact you with results. Let's also check the ultrasound.  you will receive a phone call, about a day and time for an appointment

## 2013-09-24 NOTE — Progress Notes (Signed)
Subjective:    Patient ID: Margaret Green, female    DOB: 1961-07-14, 52 y.o.   MRN: [id number]  HPI Primary Physician: Georgetta Haber, MD  Chief Complaint: URI symptoms x 3 days  HPI: 53 y.o. female with history below presents with 3 day history of worsening of nasal congestion, rhinorrhea, post nasal drip, sore throat, otalgia, cough, chills, and fatigue. No fever. Cough is mildly productive of yellow sputum and worsening. Cough is worse at nighttime keeping the patient awake at night. No SOB or wheezing. Ears feel muffled, right worse than left. Sinus headache located behind the eyes. Slightly decreased appetite. No GI symptoms. Multiple sick contacts at work. One of which had confirmed strep throat.   No influenza vaccine this year.   Past Medical History  Diagnosis Date  . Anxiety   . Stricture and stenosis of esophagus   . Elevated blood pressure reading without diagnosis of hypertension   . Dyspepsia   . Endometriosis, site unspecified   . GERD (gastroesophageal reflux disease)   . Neutropenia   . Other symptoms involving digestive system(787.99)   . Abdominal pain, other specified site   . Allergy   . Abnormal weight gain   . Symptomatic menopausal or female climacteric states   . Myalgia and myositis, unspecified   . Arthritis   . Raynaud's syndrome   . Hyperlipidemia   . Hypothyroidism   . Headache(784.0)   . Family history of diabetes mellitus      Home Meds: Prior to Admission medications   Medication Sig Start Date End Date Taking? Authorizing Provider  butalbital-aspirin-caffeine [facility]) 50-325-40 MG per capsule take 1 capsule by mouth every 4 hours if needed 02/19/13  Yes Ricard Dillon, MD  Calcium Carb-Cholecalciferol 500-400 MG-UNIT CHEW Chew 500 mg by mouth daily.     Yes Historical Provider, MD  cetirizine (ZYRTEC) 10 MG tablet Take 10 mg by mouth daily.     Yes Historical Provider, MD  clonazePAM (KLONOPIN) 0.5 MG tablet take 1 tablet by  mouth twice a day 09/13/13  Yes Ricard Dillon, MD  cyclobenzaprine (FLEXERIL) 10 MG tablet Take 1 tablet (10 mg total) by mouth 3 (three) times daily as needed for muscle spasms. 06/20/13  Yes Ellison Carwin, MD  diphenoxylate-atropine (LOMOTIL) 2.5-0.025 MG per tablet Take 1 tablet by mouth 4 (four) times daily as needed. 08/07/12  Yes Ricard Dillon, MD  fluticasone [facility]) 50 MCG/ACT nasal spray USE AS DIRECTED BY PRESCRIBER 12/21/12  Yes Ricard Dillon, MD  hyoscyamine (LEVBID) 0.375 MG 12 hr tablet Take 1 tablet (0.375 mg total) by mouth 2 (two) times daily. 08/28/13  Yes Ricard Dillon, MD  levothyroxine (SYNTHROID, LEVOTHROID) 75 MCG tablet Take 1 tablet (75 mcg total) by mouth daily. 08/28/13  Yes Ricard Dillon, MD  promethazine (PHENERGAN) 25 MG tablet Take 25 mg by mouth every 6 (six) hours as needed.     Yes Historical Provider, MD  rosuvastatin (CRESTOR) 20 MG tablet Take 1 tablet (20 mg total) by mouth daily. 09/20/13  Yes Ricard Dillon, MD  SUMAtriptan (IMITREX) 100 MG tablet TAKE TABLETS BY MOUTH AS DIRECTED BY YOUR DOCTOR IF NEEDED 06/17/13  Yes Ricard Dillon, MD  LIPITOR 20 MG tablet take 1 tablet by mouth every evening 06/23/11   Ricard Dillon, MD    Allergies:  Allergies  Allergen Reactions  . Codeine   . Meloxicam     REACTION: rash: itching  . Oxycodone-Aspirin  REACTION: rash: itching  . Sulfamethoxazole     REACTION: rash: itching    History   Social History  . Marital Status: Single    Spouse Name: N/A    Number of Children: N/A  . Years of Education: N/A   Occupational History  . Not on file.   Social History Main Topics  . Smoking status: Never Smoker   . Smokeless tobacco: Never Used  . Alcohol Use: No  . Drug Use: No  . Sexual Activity: Yes   Other Topics Concern  . Not on file   Social History Narrative  . No narrative on file       Review of Systems  Constitutional: Positive for chills, appetite change and fatigue. Negative for  fever.  HENT: Positive for congestion, ear pain, hearing loss, postnasal drip, rhinorrhea, sinus pressure, sneezing and sore throat.        R ear feels more pressure than normal  Respiratory: Positive for cough and wheezing. Negative for shortness of breath.        Cough is worse at night.   Gastrointestinal: Negative for nausea, vomiting and diarrhea.  Neurological: Positive for headaches.       Objective:   Physical Exam  Physical Exam: Blood pressure 138/86, pulse 73, temperature 99.1 F (37.3 C), temperature source Oral, resp. rate 16, height 5\' 4"  (1.626 m), weight 160 lb 12.8 oz (72.938 kg), SpO2 100.00%., Body mass index is 27.59 kg/(m^2). General: Well developed, well nourished, in no acute distress. Head: Normocephalic, atraumatic, eyes without discharge, sclera non-icteric, nares are congested. Bilateral auditory canals clear, TM's are without perforation, pearly grey with reflective cone of light bilaterally. No sinus TTP. Oral cavity moist, dentition normal. Posterior pharynx erythematous with exudate along the left side. Tonsils 1+ bilaterally. No post nasal drip or peritonsillar abscess. Uvula midline.  Neck: Supple. No thyromegaly. Full ROM. Lymph nodes: less than 2 cm AC bilaterally. Lungs: Coarse breath sounds bilaterally without wheezes, rales, or rhonchi. Breathing is unlabored.  Heart: RRR with S1 S2. No murmurs, rubs, or gallops appreciated. Msk:  Strength and tone normal for age. Extremities: No clubbing or cyanosis. No edema. Neuro: Alert and oriented X 3. Moves all extremities spontaneously. CNII-XII grossly in tact. Psych:  Responds to questions appropriately with a normal affect.    Labs: Results for orders placed in visit on 09/24/13  POCT INFLUENZA A/B      Result Value Range   Influenza A, POC Negative     Influenza B, POC Negative    POCT RAPID STREP A (OFFICE)      Result Value Range   Rapid Strep A Screen Negative  Negative   Throat culture  pending     Assessment & Plan:  52 year old female with bronchitis, pharyngitis, and cough -Azithromycin 250 MG #6 2 po first day then 1 po next 4 days no RF -Atrovent NS 0.06% 2 sprays each nare bid prn #1 no RF -Robitussin with Phenergan 1 tsp po qid prn cough #120 mL no RF -Rest/fluids -RTC precautions  Christell Faith, PA-C [facility] [address] [place], [place] [address] [phone] 09/24/2013 1:31 PM

## 2013-09-26 ENCOUNTER — Ambulatory Visit: Payer: 59 | Admitting: Internal Medicine

## 2013-09-26 LAB — CULTURE, GROUP A STREP: ORGANISM ID, BACTERIA: NORMAL

## 2013-09-27 ENCOUNTER — Ambulatory Visit
Admission: RE | Admit: 2013-09-27 | Discharge: 2013-09-27 | Disposition: A | Discharge status: Home / Self Care | Payer: 59 | Source: Ambulatory Visit | Attending: Endocrinology | Admitting: Endocrinology

## 2013-09-27 DIAGNOSIS — E041 Nontoxic single thyroid nodule: Secondary | ICD-10-CM

## 2013-10-09 DIAGNOSIS — Z0271 Encounter for disability determination: Secondary | ICD-10-CM

## 2013-11-12 ENCOUNTER — Other Ambulatory Visit: Payer: Self-pay | Admitting: *Deleted

## 2013-11-12 ENCOUNTER — Other Ambulatory Visit: Payer: Self-pay | Admitting: Internal Medicine

## 2013-12-08 ENCOUNTER — Other Ambulatory Visit: Payer: Self-pay | Admitting: Internal Medicine

## 2014-01-03 ENCOUNTER — Encounter: Payer: Self-pay | Admitting: Internal Medicine

## 2014-03-01 ENCOUNTER — Other Ambulatory Visit: Payer: Self-pay | Admitting: Internal Medicine

## 2014-04-06 ENCOUNTER — Other Ambulatory Visit: Payer: Self-pay | Admitting: Internal Medicine

## 2014-04-18 ENCOUNTER — Telehealth: Payer: Self-pay | Admitting: Internal Medicine

## 2014-04-18 ENCOUNTER — Other Ambulatory Visit: Payer: Self-pay | Admitting: Internal Medicine

## 2014-04-18 NOTE — Telephone Encounter (Signed)
Pt needs refill on diphenoxylate 2.5-0.025 call [facility]

## 2014-04-18 NOTE — Telephone Encounter (Signed)
Has not been prescribed since 2013.  Dr Arnoldo Morale no longer here to ask for approval.  Pt will need office visit if she is having diarrhea

## 2014-04-21 NOTE — Telephone Encounter (Signed)
Lmom for pt to c/b

## 2014-04-21 NOTE — Telephone Encounter (Signed)
Pt called back and stated she isn't having diarrhea, just wanted to have the medication re-filled just in case. She is scheduled to est with Dr. Yong Channel on 08/14/14.

## 2014-04-22 NOTE — Telephone Encounter (Signed)
Pt aware, she is establishing with Dr Yong Channel in December

## 2014-04-22 NOTE — Telephone Encounter (Signed)
Lomotil is not prescribed for prophylaxis, its a controlled substance.  If she gets diarrhea she can buy imodium OTC.  Left message for pt to call back

## 2014-06-02 ENCOUNTER — Telehealth: Payer: Self-pay | Admitting: Internal Medicine

## 2014-06-02 MED ORDER — HYOSCYAMINE SULFATE ER 0.375 MG PO TB12: 0.3750 mg | ORAL_TABLET | Freq: Two times a day (BID) | ORAL | Status: DC

## 2014-06-02 NOTE — Telephone Encounter (Signed)
[id number] is requesting re-fill on hyoscyamine (LEVBID) 0.375 MG 12 hr tablet

## 2014-06-02 NOTE — Telephone Encounter (Signed)
Medication refilled

## 2014-06-18 ENCOUNTER — Encounter: Payer: Self-pay | Admitting: Internal Medicine

## 2014-07-24 ENCOUNTER — Ambulatory Visit (INDEPENDENT_AMBULATORY_CARE_PROVIDER_SITE_OTHER): Payer: Self-pay | Admitting: Emergency Medicine

## 2014-07-24 VITALS — BP 152/100 | HR 93 | Temp 98.5°F | Resp 16

## 2014-07-24 DIAGNOSIS — IMO0001 Reserved for inherently not codable concepts without codable children: Secondary | ICD-10-CM

## 2014-07-24 DIAGNOSIS — R030 Elevated blood-pressure reading, without diagnosis of hypertension: Secondary | ICD-10-CM

## 2014-07-24 DIAGNOSIS — I10 Essential (primary) hypertension: Secondary | ICD-10-CM

## 2014-07-24 LAB — COMPREHENSIVE METABOLIC PANEL WITH GFR
ALT: 22 U/L (ref 0–35)
AST: 21 U/L (ref 0–37)
Albumin: 4.1 g/dL (ref 3.5–5.2)
Alkaline Phosphatase: 63 U/L (ref 39–117)
BUN: 9 mg/dL (ref 6–23)
CO2: 29 meq/L (ref 19–32)
Calcium: 9.4 mg/dL (ref 8.4–10.5)
Chloride: 103 meq/L (ref 96–112)
Creat: 0.69 mg/dL (ref 0.50–1.10)
Glucose, Bld: 94 mg/dL (ref 70–99)
Potassium: 4.5 meq/L (ref 3.5–5.3)
Sodium: 137 meq/L (ref 135–145)
Total Bilirubin: 0.5 mg/dL (ref 0.2–1.2)
Total Protein: 7.6 g/dL (ref 6.0–8.3)

## 2014-07-24 LAB — POCT CBC
Granulocyte percent: 45.2 % (ref 37–80)
HCT, POC: 44.8 % (ref 37.7–47.9)
Hemoglobin: 14.6 g/dL (ref 12.2–16.2)
Lymph, poc: 2.1 (ref 0.6–3.4)
MCH, POC: 29.7 pg (ref 27–31.2)
MCHC: 32.6 g/dL (ref 31.8–35.4)
MCV: 90.9 fL (ref 80–97)
MID (cbc): 0.4 (ref 0–0.9)
MPV: 6.9 fL (ref 0–99.8)
POC Granulocyte: 2 (ref 2–6.9)
POC LYMPH PERCENT: 46.5 % (ref 10–50)
POC MID %: 8.3 % (ref 0–12)
Platelet Count, POC: 293 10*3/uL (ref 142–424)
RBC: 4.92 M/uL (ref 4.04–5.48)
RDW, POC: 14 %
WBC: 4.5 10*3/uL — AB (ref 4.6–10.2)

## 2014-07-24 LAB — POCT GLYCOSYLATED HEMOGLOBIN (HGB A1C): Hemoglobin A1C: 5.5

## 2014-07-24 MED ORDER — LOSARTAN POTASSIUM 50 MG PO TABS: 50.0000 mg | ORAL_TABLET | Freq: Every day | ORAL | Status: DC

## 2014-07-24 NOTE — Patient Instructions (Signed)

## 2014-07-24 NOTE — Progress Notes (Signed)
[facility] [address] [place] [address] [phone]  Date:  07/24/2014   Name:  Margaret Green   DOB:  [birth year]   MRN:  [id number]  PCP:  Georgetta Haber, MD    Chief Complaint: Hypertension   History of Present Illness:  Margaret Green is a 53 y.o. very pleasant female patient who presents with the following:  Patient has a history of elevated lipids.  Over the past six weeks she has noted a rise in her blood pressure. Over the past two days she has recorded pressures as high as 220/110 with lowest pressures in the 160/100 range She has experienced intermittent headaches with no associated neuro or visual symptoms.   She has had no shortness of breath or nausea or palpitations with this. When she arrived here she says she experienced a momentary chest tightness that she attributes to her anxiety. She had no radiation of pain and no associated symptoms.   She has experienced no recurrence. She is a non smoker.  Has no DM or history of hypertension No improvement with over the counter medications or other home remedies. Denies other complaint or health concern today.   Patient Active Problem List   Diagnosis Date Noted  . Right thyroid nodule 09/24/2013  . ANXIETY 09/29/2010  . ESOPHAGEAL STRICTURE 09/29/2010  . ELEVATED BLOOD PRESSURE WITHOUT DIAGNOSIS OF HYPERTENSION 01/18/2010  . DYSPEPSIA 08/24/2009  . ENDOMETRIOSIS 08/19/2009  . NEUTROPENIA UNSPECIFIED 07/23/2009  . NAUSEA 07/23/2009  . HEARTBURN 07/23/2009  . CHANGE IN BOWELS 07/23/2009  . Abdominal pain, generalized 07/23/2009  . ABDOMINAL PAIN-MULTIPLE SITES 07/23/2009  . ANEMIA, PERNICIOUS 02/24/2009  . WEIGHT GAIN, ABNORMAL 12/22/2008  . MENOPAUSE 04/16/2007  . SPRAIN/STRAIN, LUMBAR REGION 04/16/2007  . HYPERLIPIDEMIA 04/13/2007  . RAYNAUD'S DISEASE 04/13/2007  . OSTEOARTHRITIS 04/13/2007  . FIBROMYALGIA 04/13/2007  . HEADACHE 04/13/2007  . HYPOTHYROIDISM  04/03/2007    Past Medical History  Diagnosis Date  . Anxiety   . Stricture and stenosis of esophagus   . Elevated blood pressure reading without diagnosis of hypertension   . Dyspepsia   . Endometriosis, site unspecified   . GERD (gastroesophageal reflux disease)   . Neutropenia   . Other symptoms involving digestive system(787.99)   . Abdominal pain, other specified site   . Allergy   . Abnormal weight gain   . Symptomatic menopausal or female climacteric states   . Myalgia and myositis, unspecified   . Arthritis   . Raynaud's syndrome   . Hyperlipidemia   . Hypothyroidism   . Headache(784.0)   . Family history of diabetes mellitus     Past Surgical History  Procedure Laterality Date  . Abdominal hysterectomy    . Hernia repair    . Breast surgery      biopsy  . Laparoscopy    . Elevated lipis      History  Substance Use Topics  . Smoking status: Never Smoker   . Smokeless tobacco: Never Used  . Alcohol Use: No    Family History  Problem Relation Age of Onset  . Coronary artery disease    . Ovarian cancer    . Stomach cancer    . Cirrhosis Mother   . Diabetes Mother   . Heart disease Father   . Colon cancer Neg Hx     Allergies  Allergen Reactions  . Codeine   . Meloxicam     REACTION: rash: itching  . Oxycodone-Aspirin  REACTION: rash: itching  . Sulfamethoxazole     REACTION: rash: itching    Medication list has been reviewed and updated.  Current Outpatient Prescriptions on File Prior to Visit  Medication Sig Dispense Refill  . ALPRAZolam (XANAX) 0.25 MG tablet take 1 tablet by mouth three times a day if needed 60 tablet 2  . Calcium Carb-Cholecalciferol 500-400 MG-UNIT CHEW Chew 500 mg by mouth daily.      . cetirizine (ZYRTEC) 10 MG tablet Take 10 mg by mouth daily.      . hyoscyamine (LEVBID) 0.375 MG 12 hr tablet Take 1 tablet (0.375 mg total) by mouth 2 (two) times daily. 60 tablet 1  . levothyroxine (SYNTHROID, LEVOTHROID) 75 MCG  tablet Take 1 tablet (75 mcg total) by mouth daily. 90 tablet 3  . promethazine (PHENERGAN) 25 MG tablet Take 25 mg by mouth every 6 (six) hours as needed.      . rosuvastatin (CRESTOR) 20 MG tablet Take 1 tablet (20 mg total) by mouth daily. 90 tablet 3  . azithromycin (ZITHROMAX Z-PAK) 250 MG tablet 2 tabs po first day, then 1 tab po next 4 days 6 tablet 0  . butalbital-aspirin-caffeine (FIORINAL) 50-325-40 MG per capsule take 1 capsule by mouth every 4 hours if needed 60 capsule 4  . clonazePAM (KLONOPIN) 0.5 MG tablet take 1 tablet by mouth twice a day 60 tablet 5  . cyclobenzaprine (FLEXERIL) 10 MG tablet Take 1 tablet (10 mg total) by mouth 3 (three) times daily as needed for muscle spasms. 30 tablet 0  . diphenoxylate-atropine (LOMOTIL) 2.5-0.025 MG per tablet Take 1 tablet by mouth 4 (four) times daily as needed. 30 tablet 0  . fluticasone (FLONASE) 50 MCG/ACT nasal spray USE AS DIRECTED BY PRESCRIBER 16 g 10  . ipratropium (ATROVENT) 0.06 % nasal spray Place 2 sprays into the nose 3 (three) times daily. 15 mL 0  . promethazine-dextromethorphan (PROMETHAZINE-DM) 6.25-15 MG/5ML syrup Take 5 mLs by mouth 4 (four) times daily as needed for cough. 120 mL 0  . SUMAtriptan (IMITREX) 100 MG tablet TAKE TABLETS BY MOUTH AS DIRECTED BY YOUR DOCTOR IF NEEDED 6 tablet 6   No current facility-administered medications on file prior to visit.    Review of Systems:  As per HPI, otherwise negative.    Physical Examination: Filed Vitals:   07/24/14 1205  BP: 152/100  Pulse: 93  Temp: 98.5 F (36.9 C)  Resp: 16   There were no vitals filed for this visit. There is no weight on file to calculate BMI. Ideal Body Weight:    GEN: WDWN, NAD, Non-toxic, A & O x 3 HEENT: Atraumatic, Normocephalic. Neck supple. No masses, No LAD. Ears and Nose: No external deformity. CV: RRR, No M/G/R. No JVD. No thrill. No extra heart sounds. PULM: CTA B, no wheezes, crackles, rhonchi. No retractions. No resp.  distress. No accessory muscle use. ABD: S, NT, ND, +BS. No rebound. No HSM. EXTR: No c/c/e NEURO Normal gait.  PSYCH: Normally interactive. Conversant. Not depressed or anxious appearing.  Calm demeanor.    Assessment and Plan: Hypertension HLD Start cozaar Follow up in one week  Red flags  Signed,  Ellison Carwin, MD

## 2014-08-14 ENCOUNTER — Encounter: Payer: Self-pay | Admitting: Family Medicine

## 2014-08-14 ENCOUNTER — Ambulatory Visit (INDEPENDENT_AMBULATORY_CARE_PROVIDER_SITE_OTHER): Payer: Self-pay | Admitting: Family Medicine

## 2014-08-14 DIAGNOSIS — I10 Essential (primary) hypertension: Secondary | ICD-10-CM | POA: Insufficient documentation

## 2014-08-14 DIAGNOSIS — J309 Allergic rhinitis, unspecified: Secondary | ICD-10-CM | POA: Insufficient documentation

## 2014-08-14 DIAGNOSIS — K219 Gastro-esophageal reflux disease without esophagitis: Secondary | ICD-10-CM | POA: Insufficient documentation

## 2014-08-14 DIAGNOSIS — G43909 Migraine, unspecified, not intractable, without status migrainosus: Secondary | ICD-10-CM | POA: Insufficient documentation

## 2014-08-14 DIAGNOSIS — K589 Irritable bowel syndrome without diarrhea: Secondary | ICD-10-CM | POA: Insufficient documentation

## 2014-08-14 MED ORDER — LEVOTHYROXINE SODIUM 75 MCG PO TABS: 75.0000 ug | ORAL_TABLET | Freq: Every day | ORAL | Status: DC

## 2014-08-14 MED ORDER — HYOSCYAMINE SULFATE ER 0.375 MG PO TB12: 0.3750 mg | ORAL_TABLET | Freq: Two times a day (BID) | ORAL | Status: DC

## 2014-08-14 MED ORDER — DIPHENOXYLATE-ATROPINE 2.5-0.025 MG PO TABS: 1.0000 | ORAL_TABLET | Freq: Four times a day (QID) | ORAL | Status: DC | PRN

## 2014-08-14 MED ORDER — ALPRAZOLAM 0.25 MG PO TABS: ORAL_TABLET | ORAL | Status: DC

## 2014-08-14 NOTE — Patient Instructions (Addendum)
Blood pressure looks much better and I really like your home readings. Please continue Losartan.   Health Maintenance Due  Topic Date Due  . PAP SMEAR - next visit have them send [place] a copy 01/09/1980  . TETANUS/TDAP -next visit 09/12/2013

## 2014-08-14 NOTE — Progress Notes (Signed)
Margaret Reddish, MD Phone: [phone]  Subjective:  Patient presents today to establish care with me as their new primary care provider and for simple blood pressure recheck after recent start of medicatoin. Patient was formerly a patient of Dr. Arnoldo Morale. Chief complaint-noted.   Hypertension-good control  BP Readings from Last 3 Encounters:  08/14/14 100/84  07/24/14 152/100  09/24/13 118/76  Home BP monitoring-117-127/60s or 70s.  Compliant with medications-yes without side effects except very mild occasional lightheadenedness with standing.  ROS-Denies any CP, HA, SOB, blurry vision, LE edema.   The following were reviewed and entered/updated in epic: Past Medical History  Diagnosis Date  . Anxiety   . Stricture and stenosis of esophagus   . Endometriosis, site unspecified   . GERD (gastroesophageal reflux disease)   . Neutropenia   . Allergy   . Arthritis   . Raynaud's syndrome   . Hyperlipidemia   . Hypothyroidism   . Headache(784.0)   . Family history of diabetes mellitus   . Endometriosis 08/19/2009    Hysterectomy.      Patient Active Problem List   Diagnosis Date Noted  . Migraine 08/14/2014    Priority: Medium  . IBS (irritable bowel syndrome) 08/14/2014    Priority: Medium  . Essential hypertension, benign 08/14/2014    Priority: Medium  . Right thyroid nodule 09/24/2013    Priority: Medium  . Anxiety state 09/29/2010    Priority: Medium  . Hyperlipidemia 04/13/2007    Priority: Medium  . Fibromyalgia 04/13/2007    Priority: Medium  . Hypothyroidism 04/03/2007    Priority: Medium  . Allergic rhinitis 08/14/2014    Priority: Low  . GERD (gastroesophageal reflux disease) 08/14/2014    Priority: Low  . Neutropenia 07/23/2009    Priority: Low  . ANEMIA, PERNICIOUS 02/24/2009    Priority: Low  . SPRAIN/STRAIN, LUMBAR REGION 04/16/2007    Priority: Low  . RAYNAUD'S DISEASE 04/13/2007    Priority: Low  . Osteoarthritis 04/13/2007    Priority: Low     Past Surgical History  Procedure Laterality Date  . Abdominal hysterectomy    . Hernia repair    . Breast reduction surgery    . Breast biopsy      benign    Family History  Problem Relation Age of Onset  . Coronary artery disease Father     age 17, all 3 siblings with early cardiac disease  . Ovarian cancer Maternal Aunt   . Stomach cancer Paternal Aunt   . Cirrhosis Mother     alcohol related  . Diabetes Paternal Grandmother     and aunt  . Colon cancer Neg Hx     Medications- reviewed and updated Current Outpatient Prescriptions  Medication Sig Dispense Refill  . butalbital-aspirin-caffeine (FIORINAL) 50-325-40 MG per capsule take 1 capsule by mouth every 4 hours if needed 60 capsule 4  . Calcium Carb-Cholecalciferol 500-400 MG-UNIT CHEW Chew 500 mg by mouth daily.      . cetirizine (ZYRTEC) 10 MG tablet Take 10 mg by mouth daily.      . diphenoxylate-atropine (LOMOTIL) 2.5-0.025 MG per tablet Take 1 tablet by mouth 4 (four) times daily as needed. 30 tablet 4  . hyoscyamine (LEVBID) 0.375 MG 12 hr tablet Take 1 tablet (0.375 mg total) by mouth 2 (two) times daily. 60 tablet 1  . levothyroxine (SYNTHROID, LEVOTHROID) 75 MCG tablet Take 1 tablet (75 mcg total) by mouth daily. 90 tablet 1  . losartan (COZAAR) 50 MG tablet Take  1 tablet (50 mg total) by mouth daily. 30 tablet 3  . rosuvastatin (CRESTOR) 20 MG tablet Take 1 tablet (20 mg total) by mouth daily. 90 tablet 3  . SUMAtriptan (IMITREX) 100 MG tablet TAKE TABLETS BY MOUTH AS DIRECTED BY YOUR DOCTOR IF NEEDED 6 tablet 6  . ALPRAZolam (XANAX) 0.25 MG tablet take 1 tablet by twice a day as needed 60 tablet 2  . cyclobenzaprine (FLEXERIL) 10 MG tablet Take 1 tablet (10 mg total) by mouth 3 (three) times daily as needed for muscle spasms. (Patient not taking: Reported on 08/14/2014) 30 tablet 0  . promethazine (PHENERGAN) 25 MG tablet Take 25 mg by mouth every 6 (six) hours as needed.       No current facility-administered  medications for this visit.    Allergies-reviewed and updated Allergies  Allergen Reactions  . Codeine   . Meloxicam     REACTION: rash: itching  . Oxycodone-Aspirin     REACTION: rash: itching  . Sulfamethoxazole     REACTION: rash: itching    History   Social History  . Marital Status: Single    Spouse Name: N/A    Number of Children: N/A  . Years of Education: N/A   Social History Main Topics  . Smoking status: Never Smoker   . Smokeless tobacco: Never Used  . Alcohol Use: No  . Drug Use: No  . Sexual Activity: Yes   Other Topics Concern  . None   Social History Narrative   Gay. Partner for 2 years in 2013. Prior 20 year relationship-still friends. No children. 1 dog-peekapoo.       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       Hobbies: golf, cooking, gardening, poker    ROS--See HPI   Objective: BP 100/84 mmHg  Temp(Src) 98.2 F (36.8 C)  Ht 5\' 4"  (1.626 m)  Wt 171 lb (77.565 kg)  BMI 29.34 kg/m2 Gen: NAD, resting comfortably HEENT: Mucous membranes are moist. Oropharynx normal.  Neck: goiter noted CV: RRR no murmurs rubs or gallops Lungs: CTAB no crackles, wheeze, rhonchi Abdomen: soft/nontender/nondistended/normal bowel sounds.  Ext: no edema Skin: warm, dry, no rash Neuro: grossly normal, moves all extremities, PERRLA   Assessment/Plan:  Essential hypertension, benign Patient following up for simple blood pressure recheck after starting losartan 50mg . She is doing well on medication and bP has responded well. Discussed we would follow and consider 25mg  in future if remains as low as currently is. Discussed blood work to check creatinine with recent start but patient declines and prefers to do this at follow up visit in 6 months. Warned of potential risk of renal artery stenosis but she still declines.    Meds ordered this encounter  Medications  . levothyroxine  (SYNTHROID, LEVOTHROID) 75 MCG tablet    Sig: Take 1 tablet (75 mcg total) by mouth daily.    Dispense:  90 tablet    Refill:  1  . hyoscyamine (LEVBID) 0.375 MG 12 hr tablet    Sig: Take 1 tablet (0.375 mg total) by mouth 2 (two) times daily.    Dispense:  60 tablet    Refill:  1  . diphenoxylate-atropine (LOMOTIL) 2.5-0.025 MG per tablet    Sig: Take 1 tablet by mouth 4 (four) times daily as needed.    Dispense:  30 tablet    Refill:  4  . ALPRAZolam (XANAX) 0.25 MG tablet  Sig: take 1 tablet by twice a day as needed    Dispense:  60 tablet    Refill:  2

## 2014-08-15 NOTE — Addendum Note (Signed)
Addended by: Marin Olp on: 08/15/2014 08:16 AM   Modules accepted: Level of Service

## 2014-08-15 NOTE — Assessment & Plan Note (Signed)
Patient following up for simple blood pressure recheck after starting losartan 50mg . She is doing well on medication and bP has responded well. Discussed we would follow and consider 25mg  in future if remains as low as currently is. Discussed blood work to check creatinine with recent start but patient declines and prefers to do this at follow up visit in 6 months. Warned of potential risk of renal artery stenosis but she still declines.

## 2014-09-26 ENCOUNTER — Other Ambulatory Visit: Payer: Self-pay | Admitting: *Deleted

## 2014-09-26 MED ORDER — ROSUVASTATIN CALCIUM 20 MG PO TABS: 20.0000 mg | ORAL_TABLET | Freq: Every day | ORAL | Status: DC

## 2014-11-21 ENCOUNTER — Other Ambulatory Visit: Payer: Self-pay | Admitting: *Deleted

## 2014-11-21 MED ORDER — HYOSCYAMINE SULFATE ER 0.375 MG PO TB12: 0.3750 mg | ORAL_TABLET | Freq: Two times a day (BID) | ORAL | Status: DC

## 2014-12-19 ENCOUNTER — Telehealth: Payer: Self-pay

## 2014-12-19 NOTE — Telephone Encounter (Signed)
Refill request for Batalbital/ASA/Caffine capsule. Ok to refill?

## 2014-12-19 NOTE — Telephone Encounter (Signed)
May fill 

## 2014-12-22 MED ORDER — BUTALBITAL-ASPIRIN-CAFFEINE 50-325-40 MG PO CAPS: ORAL_CAPSULE | ORAL | Status: DC

## 2014-12-22 NOTE — Telephone Encounter (Signed)
Rx up front for pt to pick up.

## 2014-12-22 NOTE — Telephone Encounter (Signed)
Medication printed and faxed to [facility]

## 2014-12-25 ENCOUNTER — Ambulatory Visit (INDEPENDENT_AMBULATORY_CARE_PROVIDER_SITE_OTHER): Payer: BLUE CROSS/BLUE SHIELD | Admitting: Family Medicine

## 2014-12-25 ENCOUNTER — Encounter: Payer: Self-pay | Admitting: Family Medicine

## 2014-12-25 VITALS — BP 118/80 | HR 88 | Temp 99.2°F | Ht 64.0 in | Wt 168.7 lb

## 2014-12-25 DIAGNOSIS — J069 Acute upper respiratory infection, unspecified: Secondary | ICD-10-CM | POA: Diagnosis not present

## 2014-12-25 MED ORDER — DOXYCYCLINE HYCLATE 100 MG PO TABS: 100.0000 mg | ORAL_TABLET | Freq: Two times a day (BID) | ORAL | Status: DC

## 2014-12-25 MED ORDER — BENZONATATE 100 MG PO CAPS: 100.0000 mg | ORAL_CAPSULE | Freq: Two times a day (BID) | ORAL | Status: DC | PRN

## 2014-12-25 NOTE — Progress Notes (Signed)
Pre visit review using our clinic review tool, if applicable. No additional management support is needed unless otherwise documented below in the visit note. 

## 2014-12-25 NOTE — Progress Notes (Signed)
HPI:  URI: -started: about 5-6 days ago -symptoms:nasal congestion - now thick, PND, sore tongue and mouth, sore throat, cough, full ears, chills and body aches initially - felt better, but now feels bad again today, wheeze last night, SOB this morning worsening sinus congestion -denies:fever, SOB, NVD, tooth pain -has tried: claritin and flonase and aleve -sick contacts/travel/risks: denies flu exposure, tick exposure or or Ebola risks - but this "stuff" has been going around where she works -Hx of: allergies - has been using flonase and developed nose bleed  ROS: See pertinent positives and negatives per HPI.  Past Medical History  Diagnosis Date  . Anxiety   . Stricture and stenosis of esophagus   . Endometriosis, site unspecified   . GERD (gastroesophageal reflux disease)   . Neutropenia   . Allergy   . Arthritis   . Raynaud's syndrome   . Hyperlipidemia   . Hypothyroidism   . Headache(784.0)   . Family history of diabetes mellitus   . Endometriosis 08/19/2009    Hysterectomy.       Past Surgical History  Procedure Laterality Date  . Abdominal hysterectomy    . Hernia repair    . Breast reduction surgery    . Breast biopsy      benign    Family History  Problem Relation Age of Onset  . Coronary artery disease Father     age 20, all 3 siblings with early cardiac disease  . Ovarian cancer Maternal Aunt   . Stomach cancer Paternal Aunt   . Cirrhosis Mother     alcohol related  . Diabetes Paternal Grandmother     and aunt  . Colon cancer Neg Hx     History   Social History  . Marital Status: Single    Spouse Name: N/A  . Number of Children: N/A  . Years of Education: N/A   Social History Main Topics  . Smoking status: Never Smoker   . Smokeless tobacco: Never Used  . Alcohol Use: No  . Drug Use: No  . Sexual Activity: Yes   Other Topics Concern  . None   Social History Narrative   Gay. Partner for 2 years in 2013. Prior 20 year  relationship-still friends. No children. 1 dog-peekapoo.       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       Hobbies: golf, cooking, gardening, poker     Current outpatient prescriptions:  .  ALPRAZolam (XANAX) 0.25 MG tablet, take 1 tablet by twice a day as needed, Disp: 60 tablet, Rfl: 2 .  butalbital-aspirin-caffeine (FIORINAL) 50-325-40 MG per capsule, take 1 capsule by mouth every 4 hours if needed, Disp: 30 capsule, Rfl: 0 .  Calcium Carb-Cholecalciferol 500-400 MG-UNIT CHEW, Chew 500 mg by mouth daily.  , Disp: , Rfl:  .  cetirizine (ZYRTEC) 10 MG tablet, Take 10 mg by mouth daily.  , Disp: , Rfl:  .  cyclobenzaprine (FLEXERIL) 10 MG tablet, Take 1 tablet (10 mg total) by mouth 3 (three) times daily as needed for muscle spasms., Disp: 30 tablet, Rfl: 0 .  diphenoxylate-atropine (LOMOTIL) 2.5-0.025 MG per tablet, Take 1 tablet by mouth 4 (four) times daily as needed., Disp: 30 tablet, Rfl: 4 .  hyoscyamine (LEVBID) 0.375 MG 12 hr tablet, Take 1 tablet (0.375 mg total) by mouth 2 (two) times daily., Disp: 60 tablet, Rfl: 11 .  levothyroxine (SYNTHROID, LEVOTHROID) 75 MCG  tablet, Take 1 tablet (75 mcg total) by mouth daily., Disp: 90 tablet, Rfl: 1 .  loratadine (CLARITIN) 10 MG tablet, Take 10 mg by mouth daily., Disp: , Rfl:  .  losartan (COZAAR) 50 MG tablet, Take 1 tablet (50 mg total) by mouth daily., Disp: 30 tablet, Rfl: 3 .  promethazine (PHENERGAN) 25 MG tablet, Take 25 mg by mouth every 6 (six) hours as needed.  , Disp: , Rfl:  .  rosuvastatin (CRESTOR) 20 MG tablet, Take 1 tablet (20 mg total) by mouth daily., Disp: 90 tablet, Rfl: 3 .  SUMAtriptan (IMITREX) 100 MG tablet, TAKE TABLETS BY MOUTH AS DIRECTED BY YOUR DOCTOR IF NEEDED, Disp: 6 tablet, Rfl: 6 .  benzonatate (TESSALON) 100 MG capsule, Take 1 capsule (100 mg total) by mouth 2 (two) times daily as needed for cough., Disp: 20 capsule, Rfl:  0 .  doxycycline (VIBRA-TABS) 100 MG tablet, Take 1 tablet (100 mg total) by mouth 2 (two) times daily., Disp: 20 tablet, Rfl: 0  EXAM:  Filed Vitals:   12/25/14 1401  BP: 118/80  Pulse: 88  Temp: 99.2 F (37.3 C)    Body mass index is 28.94 kg/(m^2).  GENERAL: vitals reviewed and listed above, alert, oriented, appears well hydrated and in no acute distress  HEENT: atraumatic, conjunttiva clear, no obvious abnormalities on inspection of external nose and ears, normal appearance of ear canals and TMs, clear nasal congestion, mild post oropharyngeal erythema with PND, no tonsillar edema or exudate, no sinus TTP  NECK: no obvious masses on inspection  LUNGS: clear to auscultation bilaterally, no wheezes, rales or rhonchi, good air movement  CV: HRRR, no peripheral edema  MS: moves all extremities without noticeable abnormality  PSYCH: pleasant and cooperative, no obvious depression or anxiety  ASSESSMENT AND PLAN:  Discussed the following assessment and plan:  Acute upper respiratory infection - Plan: DG Chest 2 View  -given HPI and exam findings today, a serious infection or illness is unlikely. We discussed potential etiologies, with VURI being most likely but also discussed possibility of 2ndary bactreial illness if worsening and opted for delayed abx if not improving with supportive care, facial pain, etc We discussed treatment side effects, likely course, antibiotic misuse, transmission, and signs of developing a serious illness. CXR t exclude pna - no findings on exam today to suggest this -adivsed to stop flonase given nose bleeds -of course, we advised to return or notify a doctor immediately if symptoms worsen or persist or new concerns arise.    There are no Patient Instructions on file for this visit.   Colin Benton R.

## 2014-12-25 NOTE — Patient Instructions (Signed)
BEFORE YOU LEAVE: -xray sheet  INSTRUCTIONS FOR UPPER RESPIRATORY INFECTION:  -plenty of rest and fluids  -nasal saline wash 2-3 times daily (use prepackaged nasal saline or bottled/distilled water if making your own)   -stop the flonase  -can use tylenol or ibuprofen as directed for aches and sorethroat  -if you are taking a cough medication - use only as directed, may also try a teaspoon of honey to coat the throat and throat lozenges  -for sore throat, salt water gargles can help  -follow up if you have fevers, facial pain, tooth pain, difficulty breathing or are worsening or not getting better in 5-7 days - if fevers, sinus pain, worsening instead of improving take the antibiotic and follow up with your doctor if not improving

## 2014-12-29 ENCOUNTER — Telehealth: Payer: Self-pay | Admitting: Family Medicine

## 2014-12-29 NOTE — Telephone Encounter (Signed)
We sent doxycycline and tessalon to her pharmacy at her appt. If she did not take these and is requesting we cancel these and send to [facility] please do so. Would not advise codeine given her cough thanks.

## 2014-12-29 NOTE — Telephone Encounter (Signed)
Patient informed of the message below and states she used the [place], even used more often than recommended and this has not helped.  Denies shortness of breath, a fever nor chills, has a dry non-productive cough and she questions what should she do? Also wanted to let Dr Maudie Mercury know her ears still feel full and she has laryngitis.

## 2014-12-29 NOTE — Telephone Encounter (Signed)
I called the pt and informed her per Dr Maudie Mercury, her symptoms sound viral and she could try OTC Delsym or Mucinex; however if she feels worse she should see someone while she is out of town there in [place] and she stated she will try OTC medication first.

## 2014-12-29 NOTE — Telephone Encounter (Signed)
Pt said she is not better and is out of town and was told by Dr Maudie Mercury to call back if not better. She is asking that something be called in for congestion and cough,laryngitis She said she can take something with codeine in it   [address]

## 2015-01-06 ENCOUNTER — Other Ambulatory Visit: Payer: Self-pay | Admitting: Emergency Medicine

## 2015-02-05 ENCOUNTER — Other Ambulatory Visit: Payer: Self-pay | Admitting: Emergency Medicine

## 2015-02-10 ENCOUNTER — Other Ambulatory Visit: Payer: Self-pay

## 2015-02-10 MED ORDER — LEVOTHYROXINE SODIUM 75 MCG PO TABS: 75.0000 ug | ORAL_TABLET | Freq: Every day | ORAL | Status: DC

## 2015-02-24 ENCOUNTER — Encounter: Payer: Self-pay | Admitting: Family Medicine

## 2015-02-24 ENCOUNTER — Ambulatory Visit (INDEPENDENT_AMBULATORY_CARE_PROVIDER_SITE_OTHER): Payer: BLUE CROSS/BLUE SHIELD | Admitting: Family Medicine

## 2015-02-24 VITALS — BP 124/80 | HR 79 | Temp 97.8°F | Wt 178.0 lb

## 2015-02-24 DIAGNOSIS — D709 Neutropenia, unspecified: Secondary | ICD-10-CM

## 2015-02-24 DIAGNOSIS — E038 Other specified hypothyroidism: Secondary | ICD-10-CM

## 2015-02-24 DIAGNOSIS — I10 Essential (primary) hypertension: Secondary | ICD-10-CM

## 2015-02-24 DIAGNOSIS — E041 Nontoxic single thyroid nodule: Secondary | ICD-10-CM | POA: Diagnosis not present

## 2015-02-24 DIAGNOSIS — R490 Dysphonia: Secondary | ICD-10-CM

## 2015-02-24 LAB — BASIC METABOLIC PANEL
BUN: 7 mg/dL (ref 6–23)
CO2: 27 meq/L (ref 19–32)
Calcium: 9.2 mg/dL (ref 8.4–10.5)
Chloride: 104 mEq/L (ref 96–112)
Creatinine, Ser: 0.67 mg/dL (ref 0.40–1.20)
GFR: 97.81 mL/min (ref >60.00)
Glucose, Bld: 85 mg/dL (ref 70–99)
Potassium: 3.4 mEq/L — ABNORMAL LOW (ref 3.5–5.1)
Sodium: 136 mEq/L (ref 135–145)

## 2015-02-24 LAB — T4, FREE: FREE T4: 0.99 ng/dL (ref 0.60–1.60)

## 2015-02-24 LAB — TSH: TSH: 0.72 u[IU]/mL (ref 0.35–4.50)

## 2015-02-24 NOTE — Assessment & Plan Note (Signed)
Well controlled. Continue current meds: Losartan 50mg .

## 2015-02-24 NOTE — Progress Notes (Signed)
Margaret Reddish, MD  Subjective:  Margaret Green is a 54 y.o. year old very pleasant female patient who presents with:  Hoarseness- new  Right thyroid nodule- slightly increased in size Hypothyroidism- controlled  Voice remains raspy after URI 2 months ago Some trouble swallowing certain food, no problems with liquids. Required esophageal dilation in past and no current GERD medication.  Weight up about 10 lbs without trying ROS- Fatigued, Constipated. Denies hot or cold intolerance.   Hypertension-controlled  BP Readings from Last 3 Encounters:  02/24/15 124/80  12/25/14 118/80  08/14/14 100/84   Home BP monitoring-around 120-130 at home, <90.  Compliant with medications-yes without side effects ROS-Denies any CP, HA, SOB, blurry vision, LE edema  Past Medical History- migraines, HLD, fibromyalgia, anxiety  Medications- reviewed and updated Current Outpatient Prescriptions  Medication Sig Dispense Refill  . Calcium Carb-Cholecalciferol 500-400 MG-UNIT CHEW Chew 500 mg by mouth daily.      . cetirizine (ZYRTEC) 10 MG tablet Take 10 mg by mouth daily.      . hyoscyamine (LEVBID) 0.375 MG 12 hr tablet Take 1 tablet (0.375 mg total) by mouth 2 (two) times daily. 60 tablet 11  . levothyroxine (SYNTHROID, LEVOTHROID) 75 MCG tablet Take 1 tablet (75 mcg total) by mouth daily. 90 tablet 1  . loratadine (CLARITIN) 10 MG tablet Take 10 mg by mouth daily.    Marland Kitchen losartan (COZAAR) 50 MG tablet take 1 tablet by mouth once daily 30 tablet 0  . rosuvastatin (CRESTOR) 20 MG tablet Take 1 tablet (20 mg total) by mouth daily. 90 tablet 3  . ALPRAZolam (XANAX) 0.25 MG tablet take 1 tablet by twice a day as needed (Patient not taking: Reported on 02/24/2015) 60 tablet 2  . butalbital-aspirin-caffeine (FIORINAL) 50-325-40 MG per capsule take 1 capsule by mouth every 4 hours if needed (Patient not taking: Reported on 02/24/2015) 30 capsule 0  . cyclobenzaprine (FLEXERIL) 10 MG tablet Take 1 tablet (10  mg total) by mouth 3 (three) times daily as needed for muscle spasms. (Patient not taking: Reported on 02/24/2015) 30 tablet 0  . diphenoxylate-atropine (LOMOTIL) 2.5-0.025 MG per tablet Take 1 tablet by mouth 4 (four) times daily as needed. (Patient not taking: Reported on 02/24/2015) 30 tablet 4  . promethazine (PHENERGAN) 25 MG tablet Take 25 mg by mouth every 6 (six) hours as needed.      . SUMAtriptan (IMITREX) 100 MG tablet TAKE TABLETS BY MOUTH AS DIRECTED BY YOUR DOCTOR IF NEEDED (Patient not taking: Reported on 02/24/2015) 6 tablet 6   No current facility-administered medications for this visit.    Objective: BP 124/80 mmHg  Pulse 79  Temp(Src) 97.8 F (36.6 C)  Wt 178 lb (80.74 kg) Gen: NAD, resting comfortably Neck: right thyroid nodule just right of midline about 2 cm in size ENT: mucous membrane moist, oropharynx normal, turbinates normal CV: RRR no murmurs rubs or gallops Lungs: CTAB no crackles, wheeze, rhonchi Abdomen: soft/nontender/nondistended/normal bowel sounds. No rebound or guarding.  Ext: no edema Skin: warm, dry, no rash  Neuro: grossly normal, moves all extremities   Assessment/Plan:  Essential hypertension, benign Well controlled. Continue current meds: Losartan 50mg .     Right thyroid nodule Refer back to Dr. Loanne Drilling. From my exam, concerned for some increased size. Doubt this size would cause her hoarseness, trouble swallowing but will ask for Dr. Cordelia Pen opinion and repeat ultrasound. Next step regarding hoarseness if Dr. Loanne Drilling does not think thyroid related- would consider referral to GI given  history esophageal dilation and untreated GERD. Would consider ENT if GI workup unrevealing.   Hypothyroidism Clinically I was concerned for hypothyroidism (fatigue, constipated, weight gain) but levels came back normal. Also wondered if thyroiditis of hypothyroidism could have caused hoarseness but doubt after reviewing labs.   f/u- asked patient to reach out  to me after Dr. Loanne Drilling eval  Orders Placed This Encounter  Procedures  . Basic metabolic panel  . TSH    [facility]  . T4, free      . HIV antibody    [facility]  . Ambulatory referral to Endocrinology    Referral Priority:  Routine    Referral Type:  Consultation    Referral Reason:  Specialty Services Required    Number of Visits Requested:  1

## 2015-02-24 NOTE — Assessment & Plan Note (Signed)
Clinically I was concerned for hypothyroidism (fatigue, constipated, weight gain) but levels came back normal. Also wondered if thyroiditis of hypothyroidism could have caused hoarseness but doubt after reviewing labs.

## 2015-02-24 NOTE — Patient Instructions (Addendum)
Refer back to Dr. Loanne Drilling- hope to get you in within a few weeks  Check labs today- continue 75 mcg of thyroid medicine for now  Consider GI referral if Dr. Loanne Drilling does not think thyroid cause of issues.   IF swallowing issues persist or worsen, get you into GI regardless

## 2015-02-24 NOTE — Assessment & Plan Note (Addendum)
Refer back to Dr. Loanne Drilling. From my exam, concerned for some increased size. Doubt this size would cause her hoarseness, trouble swallowing but will ask for Dr. Cordelia Pen opinion and repeat ultrasound. Next step regarding hoarseness if Dr. Loanne Drilling does not think thyroid related- would consider referral to GI given history esophageal dilation and untreated GERD. Would consider ENT if GI workup unrevealing.

## 2015-02-25 LAB — HIV ANTIBODY (ROUTINE TESTING W REFLEX): HIV: NONREACTIVE

## 2015-03-05 ENCOUNTER — Telehealth: Payer: Self-pay

## 2015-03-05 MED ORDER — DIPHENOXYLATE-ATROPINE 2.5-0.025 MG PO TABS: 1.0000 | ORAL_TABLET | Freq: Four times a day (QID) | ORAL | Status: DC | PRN

## 2015-03-05 NOTE — Telephone Encounter (Signed)
Ok to refill 

## 2015-03-05 NOTE — Telephone Encounter (Signed)
Medication refilled

## 2015-03-05 NOTE — Telephone Encounter (Signed)
[id number], [place] [address]: diphenoxylate-atropine (LOMOTIL) 2.5-0.025 MG per tablet

## 2015-03-05 NOTE — Telephone Encounter (Signed)
Yes thanks 

## 2015-03-07 ENCOUNTER — Other Ambulatory Visit: Payer: Self-pay | Admitting: Emergency Medicine

## 2015-03-10 ENCOUNTER — Ambulatory Visit
Admission: RE | Admit: 2015-03-10 | Discharge: 2015-03-10 | Disposition: A | Discharge status: Home / Self Care | Payer: BLUE CROSS/BLUE SHIELD | Source: Ambulatory Visit | Attending: Endocrinology | Admitting: Endocrinology

## 2015-03-10 ENCOUNTER — Encounter: Payer: Self-pay | Admitting: Endocrinology

## 2015-03-10 ENCOUNTER — Ambulatory Visit (INDEPENDENT_AMBULATORY_CARE_PROVIDER_SITE_OTHER): Payer: BLUE CROSS/BLUE SHIELD | Admitting: Endocrinology

## 2015-03-10 VITALS — BP 130/80 | HR 74 | Temp 98.0°F | Wt 176.0 lb

## 2015-03-10 DIAGNOSIS — E041 Nontoxic single thyroid nodule: Secondary | ICD-10-CM | POA: Diagnosis not present

## 2015-03-10 DIAGNOSIS — R490 Dysphonia: Secondary | ICD-10-CM | POA: Insufficient documentation

## 2015-03-10 NOTE — Patient Instructions (Addendum)
Please continue the same levothyroxine.   Let's also recheck the ultrasound.  you will receive a phone call, about a day and time for an appointment. I agree with Dr Yong Channel that it is good to see a specialist about the the throat symptoms, as they are not coming from the thyroid.  Please return in 1 year.

## 2015-03-10 NOTE — Progress Notes (Signed)
Subjective:    Patient ID: Margaret Green, female    DOB: 1961-04-13, 54 y.o.   MRN: [id number]  HPI Pt returns for chronic primary hypothyroidism (dx'ed 1982; she has been on prescribed thyroid hormone therapy since then; over the past few years, the synthroid dosage has varied between 75 and 100 mcg/day; [place] in 2015 showed small multinodular goiter).  She does not notice the thyroid nodules.  She has 3 mos of slight hoarseness sensation in the throat, but no assoc pain.   Past Medical History  Diagnosis Date  . Anxiety   . Stricture and stenosis of esophagus   . Endometriosis, site unspecified   . GERD (gastroesophageal reflux disease)   . Neutropenia   . Allergy   . Arthritis   . Raynaud's syndrome   . Hyperlipidemia   . Hypothyroidism   . Headache(784.0)   . Family history of diabetes mellitus   . Endometriosis 08/19/2009    Hysterectomy.       Past Surgical History  Procedure Laterality Date  . Abdominal hysterectomy    . Hernia repair    . Breast reduction surgery    . Breast biopsy      benign    History   Social History  . Marital Status: Single    Spouse Name: N/A  . Number of Children: N/A  . Years of Education: N/A   Occupational History  . Not on file.   Social History Main Topics  . Smoking status: Never Smoker   . Smokeless tobacco: Never Used  . Alcohol Use: No  . Drug Use: No  . Sexual Activity: Yes   Other Topics Concern  . Not on file   Social History Narrative   Gay. Partner for 2 years in 2013. Prior 20 year relationship-still friends. No children. 1 dog-peekapoo.       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       Hobbies: golf, cooking, gardening, poker    Current Outpatient Prescriptions on File Prior to Visit  Medication Sig Dispense Refill  . ALPRAZolam (XANAX) 0.25 MG tablet take 1 tablet by twice a day as needed 60 tablet 2  .  butalbital-aspirin-caffeine (FIORINAL) 50-325-40 MG per capsule take 1 capsule by mouth every 4 hours if needed 30 capsule 0  . Calcium Carb-Cholecalciferol 500-400 MG-UNIT CHEW Chew 500 mg by mouth daily.      . cetirizine (ZYRTEC) 10 MG tablet Take 10 mg by mouth daily.      . cyclobenzaprine (FLEXERIL) 10 MG tablet Take 1 tablet (10 mg total) by mouth 3 (three) times daily as needed for muscle spasms. 30 tablet 0  . diphenoxylate-atropine (LOMOTIL) 2.5-0.025 MG per tablet Take 1 tablet by mouth 4 (four) times daily as needed. 30 tablet 4  . hyoscyamine (LEVBID) 0.375 MG 12 hr tablet Take 1 tablet (0.375 mg total) by mouth 2 (two) times daily. 60 tablet 11  . levothyroxine (SYNTHROID, LEVOTHROID) 75 MCG tablet Take 1 tablet (75 mcg total) by mouth daily. 90 tablet 1  . loratadine (CLARITIN) 10 MG tablet Take 10 mg by mouth daily.    Marland Kitchen losartan (COZAAR) 50 MG tablet take 1 tablet by mouth once daily 30 tablet 0  . promethazine (PHENERGAN) 25 MG tablet Take 25 mg by mouth every 6 (six) hours as needed.      . rosuvastatin (CRESTOR) 20 MG tablet Take 1 tablet (20 mg total)  by mouth daily. 90 tablet 3  . SUMAtriptan (IMITREX) 100 MG tablet TAKE TABLETS BY MOUTH AS DIRECTED BY YOUR DOCTOR IF NEEDED 6 tablet 6   No current facility-administered medications on file prior to visit.    Allergies  Allergen Reactions  . Codeine   . Meloxicam     REACTION: rash: itching  . Oxycodone-Aspirin     REACTION: rash: itching  . Sulfamethoxazole     REACTION: rash: itching    Family History  Problem Relation Age of Onset  . Coronary artery disease Father     age 21, all 3 siblings with early cardiac disease  . Ovarian cancer Maternal Aunt   . Stomach cancer Paternal Aunt   . Cirrhosis Mother     alcohol related  . Diabetes Paternal Grandmother     and aunt  . Colon cancer Neg Hx     BP 130/80 mmHg  Pulse 74  Temp(Src) 98 F (36.7 C) (Oral)  Wt 176 lb (79.833 kg)  SpO2 96%  Review of  Systems Denies sob.  She has gained weight.    Objective:   Physical Exam VITAL SIGNS:  See vs page GENERAL: no distress NECK: There is slight swelling at the right thyroid lobe, near the midline.  No palpable lymphadenopathy at the anterior neck.   Lab Results  Component Value Date   TSH 0.72 02/24/2015      Assessment & Plan:  Hoarseness, new: i advised specialist evaluation. Hypothyroidism: well-replaced Small multinodular goiter, due for f/u US  Patient is advised the following: Patient Instructions  Please continue the same levothyroxine.   Let's also recheck the ultrasound.  you will receive a phone call, about a day and time for an appointment. I agree with Dr Yong Channel that it is good to see a specialist about the the throat symptoms, as they are not coming from the thyroid.  Please return in 1 year.

## 2015-03-11 ENCOUNTER — Telehealth: Payer: Self-pay | Admitting: Family Medicine

## 2015-03-11 DIAGNOSIS — R490 Dysphonia: Secondary | ICD-10-CM

## 2015-03-11 DIAGNOSIS — R1310 Dysphagia, unspecified: Secondary | ICD-10-CM

## 2015-03-11 NOTE — Telephone Encounter (Signed)
Pt call and said she would like a call back. She said she was working with you on something

## 2015-03-11 NOTE — Telephone Encounter (Signed)
Pt call to say nothing showed up on her [place]

## 2015-03-11 NOTE — Telephone Encounter (Signed)
FYI

## 2015-03-11 NOTE — Telephone Encounter (Signed)
That's great news. Please inform patient the next step is GI referral given Dr. Loanne Drilling did not think hoarseness was thyroid related. Keba please enter this referral. Thanks

## 2015-03-11 NOTE — Telephone Encounter (Signed)
Pt notified and referral entered.

## 2015-03-12 NOTE — Telephone Encounter (Signed)
Since she is having trouble swallowing now- let's try to get this moved up. Can change to urgent referral.

## 2015-03-12 NOTE — Telephone Encounter (Signed)
Urgent referral entered. 

## 2015-03-12 NOTE — Telephone Encounter (Signed)
Pt states she cant get in until the middle of Sept 13th to see GI for her consult visit and wants to know if we can get her in sooner or put in a STAT referral since she is having problems getting her food down. Do you think this is urgent or is Sept ok?

## 2015-03-26 ENCOUNTER — Encounter: Payer: Self-pay | Admitting: Physician Assistant

## 2015-03-26 ENCOUNTER — Ambulatory Visit (INDEPENDENT_AMBULATORY_CARE_PROVIDER_SITE_OTHER): Payer: BLUE CROSS/BLUE SHIELD | Admitting: Physician Assistant

## 2015-03-26 ENCOUNTER — Other Ambulatory Visit (INDEPENDENT_AMBULATORY_CARE_PROVIDER_SITE_OTHER): Payer: BLUE CROSS/BLUE SHIELD

## 2015-03-26 VITALS — BP 110/66 | HR 78 | Ht 64.0 in | Wt 175.4 lb

## 2015-03-26 DIAGNOSIS — K625 Hemorrhage of anus and rectum: Secondary | ICD-10-CM

## 2015-03-26 DIAGNOSIS — K648 Other hemorrhoids: Secondary | ICD-10-CM | POA: Diagnosis not present

## 2015-03-26 DIAGNOSIS — K219 Gastro-esophageal reflux disease without esophagitis: Secondary | ICD-10-CM

## 2015-03-26 DIAGNOSIS — R1310 Dysphagia, unspecified: Secondary | ICD-10-CM | POA: Diagnosis not present

## 2015-03-26 LAB — CBC WITH DIFFERENTIAL/PLATELET
BASOS ABS: 0 10*3/uL (ref 0.0–0.1)
Basophils Relative: 0.6 % (ref 0.0–3.0)
EOS ABS: 0.1 10*3/uL (ref 0.0–0.7)
Eosinophils Relative: 1.9 % (ref 0.0–5.0)
HCT: 42 % (ref 36.0–46.0)
Hemoglobin: 14.1 g/dL (ref 12.0–15.0)
Lymphocytes Relative: 42.2 % (ref 12.0–46.0)
Lymphs Abs: 2.4 10*3/uL (ref 0.7–4.0)
MCHC: 33.6 g/dL (ref 30.0–36.0)
MCV: 91.3 fl (ref 78.0–100.0)
MONO ABS: 0.5 10*3/uL (ref 0.1–1.0)
Monocytes Relative: 9 % (ref 3.0–12.0)
NEUTROS ABS: 2.6 10*3/uL (ref 1.4–7.7)
Neutrophils Relative %: 46.3 % (ref 43.0–77.0)
Platelets: 317 10*3/uL (ref 150.0–400.0)
RBC: 4.6 Mil/uL (ref 3.87–5.11)
RDW: 13.1 % (ref 11.5–15.5)
WBC: 5.6 10*3/uL (ref 4.0–10.5)

## 2015-03-26 MED ORDER — RABEPRAZOLE SODIUM 20 MG PO TBEC: DELAYED_RELEASE_TABLET | ORAL | Status: DC

## 2015-03-26 MED ORDER — HYDROCORTISONE ACETATE 25 MG RE SUPP: RECTAL | Status: DC

## 2015-03-26 MED ORDER — NA SULFATE-K SULFATE-MG SULF 17.5-3.13-1.6 GM/177ML PO SOLN: ORAL | Status: DC

## 2015-03-26 NOTE — Patient Instructions (Signed)
You have been scheduled for an endoscopy and colonoscopy. Please follow the written instructions given to you at your visit today. Please pick up your prep supplies at the pharmacy within the next 1-3 days. If you use inhalers (even only as needed), please bring them with you on the day of your procedure.   Your physician has requested that you go to the basement for the following lab work before leaving today:CBC w/Diff  We have sent the following medications to your pharmacy for you to pick up at your convenience:  Suprep  Rabeprazole 20mg , 1 tablet in the morning 30 minutes prior to breakfast  Anusol Suppositories, 1 rectally twice a day for 10 days.

## 2015-03-26 NOTE — Progress Notes (Signed)
Reviewed, possibility of microscopic colitis, in assoc with Sjogrens. Also would check sprue profile at some point. I may do that when she comes for the procedures.

## 2015-03-26 NOTE — Progress Notes (Signed)
Patient ID: Margaret Green, female   DOB: 07/24/1961, 54 y.o.   MRN: [id number]    HPI:  Margaret Green is a 54 y.o.   female  referred by Marin Olp, MD for evaluation of dysphagia and hoarseness. Margaret Green is known to Dr. Maurene Capes from a history of diarrhea, esophageal stricture, dyspepsia, GERD, abdominal pain. She has a questionable history of esophageal dysmotility on a prior evaluation in 1997 when she presented with Sjogrens syndrome and mild abnormality of esophageal manometry. She had a barium swallow in 2002 that showed normal motility. Colonoscopy was performed in July 2008 for left lower quadrant abdominal pain and showed an essentially normal colon without any evidence of diverticulosis. An abdominal ultrasound in December 2010 showed small gallstones in an otherwise normal-appearing gallbladder. EGD in February 2011 showed duodenitis.  She states she has been off her PPI for over 3 years. She states in March she got sick with a cough and although she feels she has no current respiratory symptoms she continues to have a dry tickle cough with a raspy voice and hoarseness. She reports that her mouth and throat feel dry all the time. In addition she feels as if her food is getting lodged in the midesophagus and she has to drink water to push it down. She has no dysphagia to liquids. She does frequently feel as if she has a cough all in her throat. She has not appreciated heartburn but does get some mouthfuls of regurgitation.  She also reports that she is been having rectal bleeding for the past month or so. Initially she was having bleeding on a daily basis. She reports she has been passing copious amounts of mucus with her bowel movements. She has been waking up in the middle of the night with an urge to have a bowel movement and passed just large amounts of mucus. She has having 3-4 mushy bowel movements with mucus and blood. She has urgency with her bowel movements. She denies  non-steroidal anti-inflammatory use. She is unaware of a family history of inflammatory bowel disease or colon cancer. She has not had no associated oral ulcers, eye pain, or rashes, but she has been having low back pain.   Past Medical History  Diagnosis Date  . Anxiety   . Stricture and stenosis of esophagus   . Endometriosis, site unspecified   . GERD (gastroesophageal reflux disease)   . Neutropenia   . Allergy   . Arthritis   . Raynaud's syndrome   . Hyperlipidemia   . Hypothyroidism   . Headache(784.0)   . Family history of diabetes mellitus   . Endometriosis 08/19/2009    Hysterectomy.       Past Surgical History  Procedure Laterality Date  . Abdominal hysterectomy    . Hernia repair    . Breast reduction surgery    . Breast biopsy      benign   Family History  Problem Relation Age of Onset  . Coronary artery disease Father     age 10, all 3 siblings with early cardiac disease  . Ovarian cancer Maternal Aunt   . Stomach cancer Paternal Aunt   . Cirrhosis Mother     alcohol related  . Diabetes Paternal Grandmother     and aunt  . Colon cancer Neg Hx    History  Substance Use Topics  . Smoking status: Never Smoker   . Smokeless tobacco: Never Used  . Alcohol Use: No   Current Outpatient  Prescriptions  Medication Sig Dispense Refill  . ALPRAZolam (XANAX) 0.25 MG tablet take 1 tablet by twice a day as needed 60 tablet 2  . butalbital-aspirin-caffeine (FIORINAL) 50-325-40 MG per capsule take 1 capsule by mouth every 4 hours if needed 30 capsule 0  . Calcium Carb-Cholecalciferol 500-400 MG-UNIT CHEW Chew 500 mg by mouth daily.      . cetirizine (ZYRTEC) 10 MG tablet Take 10 mg by mouth daily.      . cyclobenzaprine (FLEXERIL) 10 MG tablet Take 1 tablet (10 mg total) by mouth 3 (three) times daily as needed for muscle spasms. 30 tablet 0  . diphenoxylate-atropine (LOMOTIL) 2.5-0.025 MG per tablet Take 1 tablet by mouth 4 (four) times daily as needed. 30 tablet 4    . hyoscyamine (LEVBID) 0.375 MG 12 hr tablet Take 1 tablet (0.375 mg total) by mouth 2 (two) times daily. 60 tablet 11  . levothyroxine (SYNTHROID, LEVOTHROID) 75 MCG tablet Take 1 tablet (75 mcg total) by mouth daily. 90 tablet 1  . loratadine (CLARITIN) 10 MG tablet Take 10 mg by mouth daily.    Marland Kitchen losartan (COZAAR) 50 MG tablet take 1 tablet by mouth once daily 30 tablet 0  . promethazine (PHENERGAN) 25 MG tablet Take 25 mg by mouth every 6 (six) hours as needed.      . rosuvastatin (CRESTOR) 20 MG tablet Take 1 tablet (20 mg total) by mouth daily. 90 tablet 3  . SUMAtriptan (IMITREX) 100 MG tablet TAKE TABLETS BY MOUTH AS DIRECTED BY YOUR DOCTOR IF NEEDED 6 tablet 6  . hydrocortisone (ANUSOL-HC) 25 MG suppository Apply rectally twice a day for 10 days. 12 suppository 1  . Na Sulfate-K Sulfate-Mg Sulf (SUPREP BOWEL PREP) SOLN As directed 1 Bottle 0  . RABEprazole (ACIPHEX) 20 MG tablet Take daily 30 minutes prior to breakfast 30 tablet 6   No current facility-administered medications for this visit.   Allergies  Allergen Reactions  . Codeine   . Meloxicam     REACTION: rash: itching  . Oxycodone-Aspirin     REACTION: rash: itching  . Sulfamethoxazole     REACTION: rash: itching     Review of Systems: Gen: Denies any fever, chills, sweats, anorexia, fatigue, weakness, malaise, weight loss, and sleep disorder CV: Denies chest pain, angina, palpitations, syncope, orthopnea, PND, peripheral edema, and claudication. Resp: Denies dyspnea at rest, dyspnea with exercise, cough, sputum, wheezing, coughing up blood, and pleurisy. GI: Denies vomiting blood, jaundice, and fecal incontinence.   As dysphasia to solids  GU : Denies urinary burning, blood in urine, urinary frequency, urinary hesitancy, nocturnal urination, and urinary incontinence. MS: Denies joint pain, limitation of movement, and swelling, extremity pain. Denies muscle weakness, cramps, atrophy. Has low back pain and stiffness   Derm: Denies rash, itching, dry skin, hives, moles, warts, or unhealing ulcers.  Psych: Denies depression, anxiety, memory loss, suicidal ideation, hallucinations, paranoia, and confusion. Heme: Denies bruising, bleeding, and enlarged lymph nodes. Neuro:  Denies any headaches, dizziness, paresthesias. Endo:  Denies any problems with DM, thyroid, adrenal function  Studies: US Soft Tissue Head/neck  03/10/2015   CLINICAL DATA:  Nodule.  EXAM: THYROID ULTRASOUND  TECHNIQUE: Ultrasound examination of the thyroid gland and adjacent soft tissues was performed.  COMPARISON:  09/27/2013  FINDINGS: Right thyroid lobe  Measurements: 47 x 18 x 17 mm. Inhomogeneous background echotexture. 13 x 8 x 11 mm hypoechoic nodule at junction with isthmus (previously 12 x 6 x 13). 6 x 4 x 5  mm echogenic nodule, superior pole (previously 5 mm).  Left thyroid lobe  Measurements: 51 x 15 x 16 mm. Coarse inhomogeneous echotexture. 11 x 6 x 6 mm echogenic nodule, mid lobe (previously 13 mm).  Isthmus  Thickness: 6 mm.  No nodules visualized.  Lymphadenopathy  None visualized.  IMPRESSION: 1. Mild thyromegaly with little change in small nodules. Findings do not meet current consensus criteria for biopsy. Follow-up by clinical exam is recommended. If patient has known risk factors for thyroid carcinoma, consider follow-up ultrasound in 12 months. If patient is clinically hyperthyroid, consider nuclear medicine thyroid uptake and scan. This recommendation follows the consensus statement: Management of Thyroid Nodules Detected as [place]: Society of Radiologists in [place]. Radiology 2005; [id number].   Electronically Signed   By: Lucrezia Europe M.D.   On: 03/10/2015 17:23    LAB RESULTS:  Recent Labs  03/26/15 0938  WBC 5.6  HGB 14.1  HCT 42.0  PLT 317.0    Prior Endoscopies:   See history of present illness  Physical Exam: BP 110/66 mmHg  Pulse 78  Ht 5\' 4"  (1.626 m)  Wt 175 lb 6.4 oz  (79.561 kg)  BMI 30.09 kg/m2  SpO2 98% Constitutional: Pleasant,well-developed Caucasian female in no acute distress. HEENT: Normocephalic and atraumatic. Conjunctivae are normal. No scleral icterus. Neck supple. No cervical adenopathy Cardiovascular: Normal rate, regular rhythm.  Pulmonary/chest: Effort normal and breath sounds normal. No wheezing, rales or rhonchi. Abdominal: Soft, nondistended, nontender. Bowel sounds active throughout. There are no masses palpable. No hepatomegaly. Rectal: Brown stool, heme positive. Anoscopy with internal hemorrhoids. Extremities: no edema Lymphadenopathy: No cervical adenopathy noted. Neurological: Alert and oriented to person place and time. Skin: Skin is warm and dry. No rashes noted. Psychiatric: Normal mood and affect. Behavior is normal.  ASSESSMENT AND PLAN: #1. Dysphagia, hoarseness, globus sensation. These are likely due to some poorly controlled reflux and possible esophageal spasm. An antireflux regimen has been reviewed. She will be given a trial of rabeprazole 20 mg 1 by mouth every morning 30 minutes prior to breakfast. She will be scheduled for an EGD to evaluate for esophagitis, gastritis, ulcer, stricture etc.The risks, benefits, and alternatives to endoscopy with possible biopsy and possible dilation were discussed with the patient and they consent to proceed.   #2. Change in bowel habits with rectal bleeding and mucus. Bleeding may be due to her internal hemorrhoids and she will be given a trial of Anusol HC suppositories 1 per rectum twice a day for 10 days. Her mushy stools, urgency, and mucus are concerning for possible proctitis or other inflammatory process. She will be scheduled for colonoscopy to evaluate for polyps, neoplasia, IBD, etc.The risks, benefits, and alternatives to colonoscopy with possible biopsy and possible polypectomy were discussed with the patient and they consent to proceed. A CBC will be obtained today as  well.  Further recommendations will be made pending the findings of the above.  Diandre Merica, Deloris Ping 03/26/2015, 12:18 PM  CC: Marin Olp, MD

## 2015-03-27 ENCOUNTER — Other Ambulatory Visit: Payer: Self-pay | Admitting: *Deleted

## 2015-03-27 ENCOUNTER — Telehealth: Payer: Self-pay | Admitting: *Deleted

## 2015-03-27 DIAGNOSIS — K219 Gastro-esophageal reflux disease without esophagitis: Secondary | ICD-10-CM

## 2015-03-27 NOTE — Telephone Encounter (Signed)
-----   Message from Vita Barley Hvozdovic, PA-C sent at 03/26/2015  9:08 PM EDT ----- Please have pt go for celiac panel per Dr Olevia Perches.

## 2015-03-27 NOTE — Telephone Encounter (Signed)
Labs in EPIC. Patient will come for labs.

## 2015-04-01 ENCOUNTER — Other Ambulatory Visit: Payer: BLUE CROSS/BLUE SHIELD

## 2015-04-01 DIAGNOSIS — K219 Gastro-esophageal reflux disease without esophagitis: Secondary | ICD-10-CM

## 2015-04-02 LAB — GLIADIN ANTIBODIES, SERUM
Gliadin IgA: 19 Units (ref <20)
Gliadin IgG: 5 Units (ref <20)

## 2015-04-02 LAB — TISSUE TRANSGLUTAMINASE, IGA: Tissue Transglutaminase Ab, IgA: 1 U/mL (ref <4)

## 2015-04-03 LAB — RETICULIN ANTIBODIES, IGA W TITER: Reticulin Ab, IgA: NEGATIVE

## 2015-04-06 ENCOUNTER — Other Ambulatory Visit: Payer: Self-pay | Admitting: Emergency Medicine

## 2015-04-09 ENCOUNTER — Telehealth: Payer: Self-pay | Admitting: Physician Assistant

## 2015-04-20 MED ORDER — OMEPRAZOLE 20 MG PO CPDR
20.0000 mg | DELAYED_RELEASE_CAPSULE | Freq: Every day | ORAL | Status: DC
Start: 2015-04-20 — End: 2015-07-16

## 2015-04-20 NOTE — Telephone Encounter (Signed)
Informed patient that her Aciphex was denied through her insurance company. Asked patient has she tried any other PPI's in the past that she cannot tolerate or did not work. Pt states she has tried Prilosec many years ago and some other PPI's that she cannot remember there names. Pt states she is wiling to try another PPI now since she has not been any in so long. Told her I will in omeprazole since it is usually the most covered medication. Pt agree and will call back in 2 weeks if she sees no improvement in her symptoms.

## 2015-04-30 ENCOUNTER — Telehealth: Payer: Self-pay | Admitting: Physician Assistant

## 2015-04-30 NOTE — Telephone Encounter (Signed)
Aciphex is not covered by her insurance at all.  Please advise an alternative

## 2015-04-30 NOTE — Telephone Encounter (Signed)
Margaret Green, it looks like in her office notes from 2010, AcipHex was the only pill that really helped her. If she took omeprazole today, have her avoid a PPI for 48 hours. She can then start AcipHex 20 mg by mouth every morning 30 minutes prior to breakfast. The generic is fine. If she still has hives or short of breath she should use Benadryl and see a provider if necessary( ie ER if SOB).

## 2015-04-30 NOTE — Telephone Encounter (Signed)
Patient reports that with 3rd dose of omeprazole she developed hives and "splotches on her neck and face".  She would like an alternative PPI.  Lori Hvozdovic, PA please advise alternative PPI

## 2015-04-30 NOTE — Telephone Encounter (Signed)
Left message for patient to call back  

## 2015-04-30 NOTE — Telephone Encounter (Signed)
Will they cover generic rabeprazole 20 mg? If not can try pantoprazole 40 mg.

## 2015-05-01 ENCOUNTER — Other Ambulatory Visit: Payer: Self-pay | Admitting: Physician Assistant

## 2015-05-01 MED ORDER — PANTOPRAZOLE SODIUM 40 MG PO TBEC: 40.0000 mg | DELAYED_RELEASE_TABLET | Freq: Every day | ORAL | Status: DC

## 2015-05-01 NOTE — Telephone Encounter (Signed)
rx sent for pantoprazole 40 mg.  Left message for patient to call back

## 2015-05-04 NOTE — Telephone Encounter (Signed)
Patient notified  She will call back for any additional questions or concerns 

## 2015-05-05 ENCOUNTER — Encounter: Payer: BLUE CROSS/BLUE SHIELD | Admitting: Internal Medicine

## 2015-05-09 ENCOUNTER — Other Ambulatory Visit: Payer: Self-pay | Admitting: Physician Assistant

## 2015-05-14 ENCOUNTER — Encounter: Payer: BLUE CROSS/BLUE SHIELD | Admitting: Gastroenterology

## 2015-05-26 ENCOUNTER — Ambulatory Visit: Payer: BLUE CROSS/BLUE SHIELD | Admitting: Internal Medicine

## 2015-05-27 ENCOUNTER — Other Ambulatory Visit: Payer: Self-pay | Admitting: Family Medicine

## 2015-06-15 ENCOUNTER — Ambulatory Visit (AMBULATORY_SURGERY_CENTER): Payer: BLUE CROSS/BLUE SHIELD | Admitting: Gastroenterology

## 2015-06-15 ENCOUNTER — Encounter: Payer: Self-pay | Admitting: Gastroenterology

## 2015-06-15 VITALS — BP 146/76 | HR 92 | Temp 98.7°F | Resp 18 | Ht 64.0 in | Wt 175.0 lb

## 2015-06-15 DIAGNOSIS — R1310 Dysphagia, unspecified: Secondary | ICD-10-CM

## 2015-06-15 DIAGNOSIS — R197 Diarrhea, unspecified: Secondary | ICD-10-CM

## 2015-06-15 DIAGNOSIS — D128 Benign neoplasm of rectum: Secondary | ICD-10-CM

## 2015-06-15 DIAGNOSIS — K625 Hemorrhage of anus and rectum: Secondary | ICD-10-CM

## 2015-06-15 MED ORDER — SODIUM CHLORIDE 0.9 % IV SOLN: 500.0000 mL | INTRAVENOUS | Status: DC

## 2015-06-15 NOTE — Progress Notes (Signed)
To recovery, report to Hodges, RN, VSS 

## 2015-06-15 NOTE — Patient Instructions (Signed)
YOU HAD AN ENDOSCOPIC PROCEDURE TODAY AT THE [facility]:   Refer to the procedure report that was given to you for any specific questions about what was found during the examination.  If the procedure report does not answer your questions, please call your gastroenterologist to clarify.  If you requested that your care partner not be given the details of your procedure findings, then the procedure report has been included in a sealed envelope for you to review at your convenience later.  YOU SHOULD EXPECT: Some feelings of bloating in the abdomen. Passage of more gas than usual.  Walking can help get rid of the air that was put into your GI tract during the procedure and reduce the bloating. If you had a lower endoscopy (such as a colonoscopy or flexible sigmoidoscopy) you may notice spotting of blood in your stool or on the toilet paper. If you underwent a bowel prep for your procedure, you may not have a normal bowel movement for a few days.  Please Note:  You might notice some irritation and congestion in your nose or some drainage.  This is from the oxygen used during your procedure.  There is no need for concern and it should clear up in a day or so.  SYMPTOMS TO REPORT IMMEDIATELY:   Following lower endoscopy (colonoscopy or flexible sigmoidoscopy):  Excessive amounts of blood in the stool  Significant tenderness or worsening of abdominal pains  Swelling of the abdomen that is new, acute  Fever of 100F or higher   Following upper endoscopy (EGD)  Vomiting of blood or coffee ground material  New chest pain or pain under the shoulder blades  Painful or persistently difficult swallowing  New shortness of breath  Fever of 100F or higher  Black, tarry-looking stools  For urgent or emergent issues, a gastroenterologist can be reached at any hour by calling [phone].   DIET: Your first meal following the procedure should be a small meal and then it is ok to progress to  your normal diet. Heavy or fried foods are harder to digest and may make you feel nauseous or bloated.  Likewise, meals heavy in dairy and vegetables can increase bloating.  Drink plenty of fluids but you should avoid alcoholic beverages for 24 hours.  ACTIVITY:  You should plan to take it easy for the rest of today and you should NOT DRIVE or use heavy machinery until tomorrow (because of the sedation medicines used during the test).    FOLLOW UP: Our staff will call the number listed on your records the next business day following your procedure to check on you and address any questions or concerns that you may have regarding the information given to you following your procedure. If we do not reach you, we will leave a message.  However, if you are feeling well and you are not experiencing any problems, there is no need to return our call.  We will assume that you have returned to your regular daily activities without incident.  If any biopsies were taken you will be contacted by phone or by letter within the next 1-3 weeks.  Please call us at [phone] if you have not heard about the biopsies in 3 weeks.    SIGNATURES/CONFIDENTIALITY: You and/or your care partner have signed paperwork which will be entered into your electronic medical record.  These signatures attest to the fact that that the information above on your After Visit Summary has been reviewed   and is understood.  Full responsibility of the confidentiality of this discharge information lies with you and/or your care-partner.  Hold aspirin and nsaids for two weeks per Dr. Havery Moros.  You do have a clip in your rectal area.  It will fall of within a month.  No MRI's til it is gone. You should pass it in your stool.  The recovery room nurse gave you a card for your wallet.

## 2015-06-15 NOTE — Progress Notes (Signed)
Called to room to assist during endoscopic procedure.  Patient ID and intended procedure confirmed with present staff. Received instructions for my participation in the procedure from the performing physician.  

## 2015-06-15 NOTE — Op Note (Signed)
[place]  [address]. [place], [address]   COLONOSCOPY PROCEDURE REPORT  PATIENT: Margaret Green, Margaret Green  MR#: [id number] BIRTHDATE: [birth year] , 80  yrs. old GENDER: female ENDOSCOPIST: Yetta Flock, MD REFERRED BY: [name], MD PROCEDURE DATE:  06/15/2015 PROCEDURE:   Colonoscopy with snare polypectomy and Submucosal injection, any substance First Screening Colonoscopy - Avg.  risk and is 50 yrs.  old or older - No.  Prior Negative Screening - Now for repeat screening. 10 or more years since last screening  History of Adenoma - Now for follow-up colonoscopy & has been > or = to 3 yrs.  N/A  Polyps removed today? Yes ASA CLASS:   Class III INDICATIONS:Screening for colonic neoplasia, Clinically significant diarrhea of unexplained origin, and Colorectal Neoplasm Risk Assessment for this procedure is average risk. MEDICATIONS: Propofol 250 mg IV  DESCRIPTION OF PROCEDURE:   After the risks benefits and alternatives of the procedure were thoroughly explained, informed consent was obtained.  The digital rectal exam revealed no abnormalities of the rectum.   The LB [id number]  endoscope was introduced through the anus and advanced to the ileum. No adverse events experienced.   The quality of the prep was adequate The instrument was then slowly withdrawn as the colon was fully examined. Estimated blood loss is zero unless otherwise noted in this procedure report.   COLON FINDINGS: A pedunculated polyp measuring roughly 18-20 mm in size with a friable surface was found in the proximal rectum, roughly 15cm from the anal verge and was partially obstructing the colon lumen.  A 1:10,000 Epinephrine solution Injection was given into the stalk of the polyp in order to shrink the polyp for polypectomy and prevent bleeding during polypectomy.  A polypectomy was performed using snare cautery.  The resection was complete, the polyp tissue was  completely retrieved and sent to histology. Given the size of the stalk,  the polypectomy site was closed by placing hemoclips as prophylactic measure to minimize the risk of bleeding. One (1) placement was made.  The colon was otherwise normal without inflammatory changes appreciated. Random biopsies were obtained to rule out microscopic colitis.  The examined terminal ileum appeared to be normal.  Retroflexed views revealed small internal hemorrhoids. The time to cecum = 9.0 (including injection of polyp) Withdrawal time = 16.7   The scope was withdrawn and the procedure completed. COMPLICATIONS: There were no immediate complications.  ENDOSCOPIC IMPRESSION: 1.   Pedunculated polyp was found in the rectum; 1:10,000 Epinephrine solution was given prior to polypectomy to shrink ther polyp head.; polypectomy was performed using snare cautery; the wound at the site was closed prophylactically given the size of the stalk by placing hemoclip x 1 2.   The examination was otherwise normal - biopsies taken to rule out microscopic colitis. 3.   The examined terminal ileum appeared to be normal     RECOMMENDATIONS: 1.  Hold Aspirin and all other NSAIDS for 2 weeks post-polypectomy to minimize the risk of bleeding at polypectomy site 2.  Resume diet 3.  Resume medications 4.  Await pathology results with further recommendations  eSigned:  Yetta Flock, MD 06/15/2015 3:30 PM   cc: [name] MD, the patient   PATIENT NAME:  Margaret Green MR#: [id number]

## 2015-06-15 NOTE — Op Note (Signed)
[place]  [address]. [place], [address]   ENDOSCOPY PROCEDURE REPORT  PATIENT: Margaret, Green  MR#: [id number] BIRTHDATE: [birth year] , 57  yrs. old GENDER: female ENDOSCOPIST: Yetta Flock, MD REFERRED BY:   [name] MD PROCEDURE DATE:  06/15/2015 PROCEDURE:  EGD w/ biopsy ASA CLASS:     Class III INDICATIONS:  dysphagia and unexplained diarrhea. MEDICATIONS: Propofol 300 mg IV TOPICAL ANESTHETIC:  DESCRIPTION OF PROCEDURE: After the risks benefits and alternatives of the procedure were thoroughly explained, informed consent was obtained.  The LB [id number] endoscope was introduced through the mouth and advanced to the second portion of the duodenum , Without limitations.  The instrument was slowly withdrawn as the mucosa was fully examined.  FINDINGS: The mid and proximal esophagus were normal.  There was a tongue of salmon colored mucosa noted extending up from the GEJ (Prague criteria C0M1).  No nodularity was appreciated.  Biopsies were taken of the segment.  DH, GEJ were located 38cm from the incisors.  No stenosis or stricture was noted to explain the patient's symptoms, thus no dilation was performed.  No evidence of eosinophilic esophagitis on gross exam.  The stomach was normal. The duodenal bulb and 2nd portion of the duodenum were also normal. Biopsies taken to ensure no evidence of celiac disease given symptoms of diarrhea. Retroflexed views revealed no abnormalities. The scope was then withdrawn from the patient and the procedure completed.  COMPLICATIONS: There were no immediate complications.  ENDOSCOPIC IMPRESSION: Suspected short segment of Barrett's esophagus as described above, biopsies obtained No pathology noted to explain the patient's dysphagia Normal examined stomach and duodenum   - duodenal biopsies obtained.     RECOMMENDATIONS: Resume diet Resume medications Await pathology  results   eSigned:  Yetta Flock, MD 06/15/2015 3:50 PM    CC: [name] ND, the patient  PATIENT NAME:  Margaret, Green MR#: [id number]

## 2015-06-16 ENCOUNTER — Telehealth: Payer: Self-pay | Admitting: *Deleted

## 2015-06-16 NOTE — Telephone Encounter (Signed)
  Follow up Call-  Call back number 06/15/2015  Post procedure Call Back phone  # [phone]  Permission to leave phone message Yes    Mailbox full, not able to leave message

## 2015-06-24 ENCOUNTER — Encounter: Payer: Self-pay | Admitting: *Deleted

## 2015-06-24 ENCOUNTER — Other Ambulatory Visit: Payer: Self-pay | Admitting: *Deleted

## 2015-06-24 DIAGNOSIS — C189 Malignant neoplasm of colon, unspecified: Secondary | ICD-10-CM

## 2015-06-25 ENCOUNTER — Ambulatory Visit (INDEPENDENT_AMBULATORY_CARE_PROVIDER_SITE_OTHER)
Admission: RE | Admit: 2015-06-25 | Discharge: 2015-06-25 | Disposition: A | Discharge status: Home / Self Care | Payer: BLUE CROSS/BLUE SHIELD | Source: Ambulatory Visit | Attending: Gastroenterology | Admitting: Gastroenterology

## 2015-06-25 DIAGNOSIS — C189 Malignant neoplasm of colon, unspecified: Secondary | ICD-10-CM | POA: Diagnosis not present

## 2015-06-25 MED ORDER — IOHEXOL 300 MG/ML  SOLN
100.0000 mL | Freq: Once | INTRAMUSCULAR | Status: AC | PRN
  Administered 2015-06-25: 100 mL via INTRAVENOUS

## 2015-06-26 ENCOUNTER — Other Ambulatory Visit: Payer: Self-pay | Admitting: *Deleted

## 2015-06-26 ENCOUNTER — Other Ambulatory Visit: Payer: BLUE CROSS/BLUE SHIELD

## 2015-06-26 ENCOUNTER — Telehealth: Payer: Self-pay | Admitting: Gastroenterology

## 2015-06-26 DIAGNOSIS — C189 Malignant neoplasm of colon, unspecified: Secondary | ICD-10-CM

## 2015-06-26 NOTE — Telephone Encounter (Signed)
Patient given results She just wanted to let us know she got the CT report last night in MyChart. States she worried about the results all night. Patient was given results as per MD. Told patient we are sorry she got the results prior to our call.

## 2015-06-26 NOTE — Telephone Encounter (Signed)
Called patient. I did not realize she received results online and that was not my intent. I went through the results and explained it to her. She understood. She has had monitoring of her goiter otherwise. She will follow up with surgery for their opinion. If they do not undergo surgery, recommend repeat colonoscopy exam in 3 months or so. She agreed

## 2015-06-27 LAB — CEA

## 2015-07-07 ENCOUNTER — Telehealth: Payer: Self-pay | Admitting: Gastroenterology

## 2015-07-07 DIAGNOSIS — K621 Rectal polyp: Secondary | ICD-10-CM

## 2015-07-08 MED ORDER — RABEPRAZOLE SODIUM 20 MG PO TBEC: DELAYED_RELEASE_TABLET | ORAL | Status: DC

## 2015-07-08 NOTE — Telephone Encounter (Signed)
Left a message for patient to call back. 

## 2015-07-08 NOTE — Telephone Encounter (Signed)
Yes, we can try to get her a spot in December. We can look through the schedule and see where we may be able to add it in (730 spot?) Otherwise, okay with me to switch from protonix to aciphex at the same dose. Thank you!

## 2015-07-08 NOTE — Telephone Encounter (Signed)
Patient left a message that she wants to have her colonoscopy in December since she has met her deductible. She is also asking if she can switch form protonix to Aciphex is her insurance will pay. Please, advise. Dr. Havery Moros, you do not have any scheduled spots for procedure in Dec. Please, advise .

## 2015-07-08 NOTE — Telephone Encounter (Signed)
Spoke with patient and gave her recommendations. Rx sent to pharmacy. Will call her back with procedure date.

## 2015-07-09 NOTE — Telephone Encounter (Signed)
Scheduled Prop colonoscopy on 08/18/15 at 7:30 AM at [facility] and pre visit on 07/16/15 at 8:00 AM.

## 2015-07-13 ENCOUNTER — Telehealth: Payer: Self-pay

## 2015-07-13 NOTE — Telephone Encounter (Signed)
Pt called back and stated she will pick the prep up when she comes for her previsit.

## 2015-07-13 NOTE — Telephone Encounter (Signed)
Left vm for pt to call back. Prep will be at front for pt.

## 2015-07-16 ENCOUNTER — Telehealth: Payer: Self-pay | Admitting: *Deleted

## 2015-07-16 ENCOUNTER — Telehealth: Payer: Self-pay

## 2015-07-16 ENCOUNTER — Ambulatory Visit (AMBULATORY_SURGERY_CENTER): Payer: Self-pay | Admitting: *Deleted

## 2015-07-16 VITALS — Ht 64.0 in | Wt 173.0 lb

## 2015-07-16 DIAGNOSIS — Z86010 Personal history of colonic polyps: Secondary | ICD-10-CM

## 2015-07-16 MED ORDER — NA SULFATE-K SULFATE-MG SULF 17.5-3.13-1.6 GM/177ML PO SOLN: 1.0000 | Freq: Once | ORAL | Status: DC

## 2015-07-16 MED ORDER — OMEPRAZOLE 20 MG PO CPDR: 20.0000 mg | DELAYED_RELEASE_CAPSULE | Freq: Two times a day (BID) | ORAL | Status: DC

## 2015-07-16 NOTE — Telephone Encounter (Signed)
Patient states she needs a PA for Aciphex. She is asking if her pharmacy sent one over.

## 2015-07-16 NOTE — Telephone Encounter (Signed)
Prior Margaret Green has been done it was completed on 07/14/2015. I called the patient and she did not answer and her vm is full.

## 2015-07-16 NOTE — Telephone Encounter (Signed)
Pt's insurance denied aciphex. Sent omeprazole 20mg  over to pt's pharmacy.

## 2015-07-16 NOTE — Telephone Encounter (Signed)
Patient notified

## 2015-07-16 NOTE — Progress Notes (Signed)
No egg or soy allergy No issues with past sedation-post op hx of N/V No diet pills No home 02 use  emmi video to e mail    [email or web address]

## 2015-07-16 NOTE — Telephone Encounter (Signed)
-----   Message from Manus Gunning, MD sent at 07/16/2015  2:50 PM EDT ----- Regarding: RE: Aciphex  Either one is fine. Same dose as aciphex. Thanks  ----- Message -----    From: Margreta Journey, CMA    Sent: 07/16/2015   8:18 AM      To: Manus Gunning, MD Subject: Aciphex                                        Patient's insurance does not cover aciphex. Insurance is suggesting omeprazole and protonix. Which one would you like me to advise her to take?

## 2015-07-20 ENCOUNTER — Telehealth: Payer: Self-pay | Admitting: Gastroenterology

## 2015-07-20 MED ORDER — PANTOPRAZOLE SODIUM 20 MG PO TBEC: 20.0000 mg | DELAYED_RELEASE_TABLET | Freq: Every day | ORAL | Status: DC

## 2015-07-20 NOTE — Telephone Encounter (Signed)
Can we ask her insurance what PPI they cover? Omeprazole is the cheapest usually. Thanks

## 2015-07-20 NOTE — Telephone Encounter (Signed)
Patient was given aciphex and it was denied by insurance. They suggested omeprazole. I sent omeprazole to her pharmacy and insurance denied that. What's the next step? OTC omeprazole.

## 2015-07-21 MED ORDER — OMEPRAZOLE 40 MG PO CPDR: 40.0000 mg | DELAYED_RELEASE_CAPSULE | Freq: Every day | ORAL | Status: DC

## 2015-07-21 NOTE — Telephone Encounter (Signed)
Lenice Pressman from [employer] states that aciphex prior Josem Kaufmann had been cancelled. She states that is why we did not hear anything back. Lenice Pressman states that we can start a new prior auth and resend this in. Best # [phone]. Ref# [id number]

## 2015-07-21 NOTE — Telephone Encounter (Signed)
Called [employer] and got omeprazole approved. Sent it to pharmacy and called pt.

## 2015-07-21 NOTE — Telephone Encounter (Signed)
Patient was denied for aciphex and omeprazole. Protonix was approved. Patient was already on protonix and states it did not help her. She wants to know what is the next step because she is having heart burn and a raspy voice.

## 2015-07-21 NOTE — Telephone Encounter (Signed)
Patient calling back regarding this. Best # [phone]

## 2015-07-21 NOTE — Telephone Encounter (Signed)
Okay I would try omeprazole. Thanks. Please let patient know why this has been delayed. Thanks

## 2015-07-23 ENCOUNTER — Encounter: Payer: Self-pay | Admitting: Family Medicine

## 2015-07-23 ENCOUNTER — Ambulatory Visit (INDEPENDENT_AMBULATORY_CARE_PROVIDER_SITE_OTHER): Payer: BLUE CROSS/BLUE SHIELD | Admitting: Family Medicine

## 2015-07-23 VITALS — BP 130/82 | HR 83 | Temp 98.6°F | Wt 174.0 lb

## 2015-07-23 DIAGNOSIS — F411 Generalized anxiety disorder: Secondary | ICD-10-CM

## 2015-07-23 DIAGNOSIS — Z23 Encounter for immunization: Secondary | ICD-10-CM

## 2015-07-23 DIAGNOSIS — F329 Major depressive disorder, single episode, unspecified: Secondary | ICD-10-CM

## 2015-07-23 DIAGNOSIS — R490 Dysphonia: Secondary | ICD-10-CM | POA: Diagnosis not present

## 2015-07-23 DIAGNOSIS — Z85038 Personal history of other malignant neoplasm of large intestine: Secondary | ICD-10-CM | POA: Insufficient documentation

## 2015-07-23 DIAGNOSIS — F32A Depression, unspecified: Secondary | ICD-10-CM

## 2015-07-23 DIAGNOSIS — C189 Malignant neoplasm of colon, unspecified: Secondary | ICD-10-CM | POA: Insufficient documentation

## 2015-07-23 HISTORY — DX: Malignant neoplasm of colon, unspecified: C18.9

## 2015-07-23 MED ORDER — ALPRAZOLAM 0.25 MG PO TABS: ORAL_TABLET | ORAL | Status: DC

## 2015-07-23 MED ORDER — CITALOPRAM HYDROBROMIDE 20 MG PO TABS: 20.0000 mg | ORAL_TABLET | Freq: Every day | ORAL | Status: DC

## 2015-07-23 MED ORDER — HYOSCYAMINE SULFATE ER 0.375 MG PO TB12: 0.3750 mg | ORAL_TABLET | Freq: Two times a day (BID) | ORAL | Status: DC

## 2015-07-23 MED ORDER — LEVOTHYROXINE SODIUM 75 MCG PO TABS: 75.0000 ug | ORAL_TABLET | Freq: Every day | ORAL | Status: DC

## 2015-07-23 NOTE — Assessment & Plan Note (Signed)
S: continued issues. Thought to be reflux related. EGD showed potential barrett's but was not on biopsy- regardless severe reflux associated changes. Having issues with insurance covering medicine needed. Aciphex has worked for symptoms bu tnot covered unless fails other. Failed protonix and nexium. Cut caffeine out. Still with reflux issues if lays down- fullness upper chest, a lot of burping A/P: believe patient will need aciphex- continue to work with GI toward prior approval for this. Hoping when on appropriate therapy hoarseness will also resolve.

## 2015-07-23 NOTE — Assessment & Plan Note (Signed)
S:Removed completely by polypectomy 06/2015 with negative margins. Ct chest /abd/pelvis with ? Finding in cecum but otherwise normal. CEA not elevated. Also had surgical eval Dr. Marcello Moores and no intervention advised A/P: continue close follow up with GI with repeat colonoscopy this year planned.

## 2015-07-23 NOTE — Assessment & Plan Note (Addendum)
S: states anxiety has worsened in recent months despite as needed xanax- still sparing 3x a month use. GAD7 scores 19. Worsened over 2-3 month.sVery emotional over least little thing A/P: start citalopram at 10mg  (max 20mg  with omeprazole) and follow up within 1 month.

## 2015-07-23 NOTE — Patient Instructions (Addendum)
Flu shot received today.  Great news on the colon! Thanks for sharing- hopeful for all clear on next colonoscopy  Also hope for you that aciphex can be covered  For depression- start citalopram- take 1/2 tab for now ( we may increase to full tab) and see me in 4 weeks  Lots of prescriptions today!        Taking the medicine as directed and not missing any doses is one of the best things you can do to treat your depression.  Here are some things to keep in mind:  1) Side effects (stomach upset, some increased anxiety) may happen before you notice a benefit.  These side effects typically go away over time. 2) Changes to your dose of medicine or a change in medication all together is sometimes necessary 3) Most people need to be on medication at least 6-12 months 4) Many people will notice an improvement within two weeks but the full effect of the medication can take up to 4-6 weeks 5) Stopping the medication when you start feeling better often results in a return of symptoms 6) If you start having thoughts of hurting yourself or others after starting this medicine, please call me at [phone] immediately.   7)

## 2015-07-23 NOTE — Assessment & Plan Note (Signed)
S: PHQ9 of 14 with no SI/HI. States used to be on some medicine thought to help with fibromyalgia as well so this is at least 1st if not worse than that recurrence A/P: start citalopram 10mg , follow up 1 month, sooner return precautions given

## 2015-07-23 NOTE — Progress Notes (Signed)
[name], MD  Subjective:  Margaret Green is a 54 y.o. year old very pleasant female patient who presents for/with See problem oriented charting ROS- No chest pain or shortness of breath. No headache or blurry vision. No rectal bleeding or melena.   Past Medical History-  Patient Active Problem List   Diagnosis Date Noted  . 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 Depression 07/23/2015    Priority: Medium  . Migraine 08/14/2014    Priority: Medium  . IBS (irritable bowel syndrome) 08/14/2014    Priority: Medium  . Essential hypertension, benign 08/14/2014    Priority: Medium  . Right thyroid nodule 09/24/2013    Priority: Medium  . GAD (generalized anxiety disorder) 09/29/2010    Priority: Medium  . Hyperlipidemia 04/13/2007    Priority: Medium  . Fibromyalgia 04/13/2007    Priority: Medium  . Hypothyroidism 04/03/2007    Priority: Medium  . Allergic rhinitis 08/14/2014    Priority: Low  . GERD (gastroesophageal reflux disease) 08/14/2014    Priority: Low  . Neutropenia ([place]) 07/23/2009    Priority: Low  . ANEMIA, PERNICIOUS 02/24/2009    Priority: Low  . SPRAIN/STRAIN, LUMBAR REGION 04/16/2007    Priority: Low  . RAYNAUD'S DISEASE 04/13/2007    Priority: Low  . Osteoarthritis 04/13/2007    Priority: Low  . Hoarseness 03/10/2015    Medications- reviewed and updated Current Outpatient Prescriptions  Medication Sig Dispense Refill  . Calcium Carb-Cholecalciferol 500-400 MG-UNIT CHEW Chew 500 mg by mouth daily.      . cetirizine (ZYRTEC) 10 MG tablet Take 10 mg by mouth daily.      Marland Kitchen FINACEA 15 % cream   0  . hyoscyamine (LEVBID) 0.375 MG 12 hr tablet Take 1 tablet (0.375 mg total) by mouth 2 (two) times daily. 180 tablet 3  . levothyroxine (SYNTHROID, LEVOTHROID) 75 MCG tablet Take 1 tablet (75 mcg total) by mouth daily. 90 tablet 3  . loratadine (CLARITIN) 10 MG tablet Take 10 mg by mouth daily.    Marland Kitchen losartan (COZAAR) 50 MG tablet  take 1 tablet by mouth once daily NO MORE REFILLS WITHOUT OFFICE VISITS 30 tablet 5  . omeprazole (PRILOSEC) 40 MG capsule Take 1 capsule (40 mg total) by mouth daily. 90 capsule 3  . rosuvastatin (CRESTOR) 20 MG tablet Take 1 tablet (20 mg total) by mouth daily. 90 tablet 3  . ALPRAZolam (XANAX) 0.25 MG tablet take 1 tablet by twice a day as needed 60 tablet 2  . butalbital-aspirin-caffeine (FIORINAL) 50-325-40 MG per capsule take 1 capsule by mouth every 4 hours if needed (Patient not taking: Reported on 07/23/2015) 30 capsule 0  . cyclobenzaprine (FLEXERIL) 10 MG tablet Take 1 tablet (10 mg total) by mouth 3 (three) times daily as needed for muscle spasms. (Patient not taking: Reported on 07/23/2015) 30 tablet 0  . diphenoxylate-atropine (LOMOTIL) 2.5-0.025 MG per tablet Take 1 tablet by mouth 4 (four) times daily as needed. (Patient not taking: Reported on 07/23/2015) 30 tablet 4  . promethazine (PHENERGAN) 25 MG tablet Take 25 mg by mouth every 6 (six) hours as needed.      . SUMAtriptan (IMITREX) 100 MG tablet TAKE TABLETS BY MOUTH AS DIRECTED BY YOUR DOCTOR IF NEEDED (Patient not taking: Reported on 07/23/2015) 6 tablet 6  . tiZANidine (ZANAFLEX) 4 MG tablet take 1 tablet by mouth twice a day if needed (Patient not taking: Reported on 07/23/2015) 60 tablet 5  No current facility-administered medications for this visit.    Objective: BP 130/82 mmHg  Pulse 83  Temp(Src) 98.6 F (37 C)  Wt 174 lb (78.926 kg) Gen: NAD, resting comfortably CV: RRR no murmurs rubs or gallops Lungs: CTAB no crackles, wheeze, rhonchi Abdomen: soft/nontender/nondistended/normal bowel sounds. No rebound or guarding.  Ext: no edema Skin: warm, dry Neuro: grossly normal, moves all extremities   Assessment/Plan:  Hoarseness S: continued issues. Thought to be reflux related. EGD showed potential barrett's but was not on biopsy- regardless severe reflux associated changes. Having issues with insurance  covering medicine needed. Aciphex has worked for symptoms bu tnot covered unless fails other. Failed protonix and nexium. Cut caffeine out. Still with reflux issues if lays down- fullness upper chest, a lot of burping A/P: believe patient will need aciphex- continue to work with GI toward prior approval for this. Hoping when on appropriate therapy hoarseness will also resolve.    GAD (generalized anxiety disorder) S: states anxiety has worsened in recent months despite as needed xanax- still sparing 3x a month use. GAD7 scores 19. Worsened over 2-3 month.sVery emotional over least little thing A/P: start citalopram at 10mg  (max 20mg  with omeprazole) and follow up within 1 month.    Depression S: PHQ9 of 14 with no SI/HI. States used to be on some medicine thought to help with fibromyalgia as well so this is at least 1st if not worse than that recurrence A/P: start citalopram 10mg , follow up 1 month, sooner return precautions given   Adenocarcinoma, colon ([place]) S:Removed completely by polypectomy 06/2015 with negative margins. Ct chest /abd/pelvis with ? Finding in cecum but otherwise normal. CEA not elevated. Also had surgical eval Dr. Marcello Moores and no intervention advised A/P: continue close follow up with GI with repeat colonoscopy this year planned.   Rx written for full tab citalopram but plan is 1/2 tab to start until follow up- likely will titrate up in 1 month when see her back  Return precautions advised.   Orders Placed This Encounter  Procedures  . Flu Vaccine QUAD 36+ mos IM    Meds ordered this encounter  Medications  . levothyroxine (SYNTHROID, LEVOTHROID) 75 MCG tablet    Sig: Take 1 tablet (75 mcg total) by mouth daily.    Dispense:  90 tablet    Refill:  3  . hyoscyamine (LEVBID) 0.375 MG 12 hr tablet    Sig: Take 1 tablet (0.375 mg total) by mouth 2 (two) times daily.    Dispense:  180 tablet    Refill:  3  . ALPRAZolam (XANAX) 0.25 MG tablet    Sig: take 1 tablet  by twice a day as needed    Dispense:  60 tablet    Refill:  2  . citalopram (CELEXA) 20 MG tablet    Sig: Take 1 tablet (20 mg total) by mouth daily.    Dispense:  30 tablet    Refill:  1

## 2015-08-10 ENCOUNTER — Other Ambulatory Visit: Payer: Self-pay

## 2015-08-14 ENCOUNTER — Telehealth: Payer: Self-pay

## 2015-08-14 MED ORDER — LANSOPRAZOLE 30 MG PO CPDR: 30.0000 mg | DELAYED_RELEASE_CAPSULE | Freq: Every day | ORAL | Status: DC

## 2015-08-14 NOTE — Telephone Encounter (Signed)
-----   Message from Manus Gunning, MD sent at 08/14/2015  2:38 PM EST ----- Regarding: RE: Heads up She can try prevacid. If this is not covered she could try buying it over the counter for a few weeks and see if it helps. Thanks   ----- Message -----    From: Margreta Journey, CMA    Sent: 08/13/2015   4:56 PM      To: Manus Gunning, MD Subject: RE: Heads up                                   But she called 04/30/2015 and reported that omeprazole gave her hives during her 3rd dose. aciphex was denied and so was protonix.  What is the next step?   ----- Message -----    From: Manus Gunning, MD    Sent: 08/13/2015   4:25 PM      To: Margreta Journey, CMA Subject: RE: Heads up                                   If her reaction is hives then she should not take it. Is there another form of PPI she tolerated and wanted to use?  ----- Message -----    From: Margreta Journey, CMA    Sent: 08/13/2015  11:40 AM      To: Manus Gunning, MD Subject: RE: Heads up                                   It says her reaction is hives. She has not started taking it yet.   ----- Message -----    From: Manus Gunning, MD    Sent: 08/13/2015  11:27 AM      To: Margreta Journey, CMA Subject: RE: Heads up                                   If she actively taking it? What is her reported allergy? If she is taking it without side effects she can continue it. If she has not taken it yet but has an allergy, depending on her reaction we may consider something else. Thanks   ----- Message -----    From: Margreta Journey, CMA    Sent: 08/12/2015   3:46 PM      To: Manus Gunning, MD Subject: Heads up                                       Patient has an allergy to omeprazole. Is it still ok for her to be on this?

## 2015-08-14 NOTE — Telephone Encounter (Signed)
Called pt and informed her that I will send Prevacid to her pharmacy. Pt states she has been taking Omeprazole and has been fine. Yesterday she stated she had not been taking it. I told her that since she had a previous allergic reaction to switch to prevacid. Patient will call with progress in 2 weeks.

## 2015-08-18 ENCOUNTER — Encounter: Payer: Self-pay | Admitting: Internal Medicine

## 2015-08-18 ENCOUNTER — Telehealth: Payer: Self-pay | Admitting: *Deleted

## 2015-08-18 ENCOUNTER — Ambulatory Visit (AMBULATORY_SURGERY_CENTER): Payer: BLUE CROSS/BLUE SHIELD | Admitting: Internal Medicine

## 2015-08-18 VITALS — BP 145/76 | HR 65 | Temp 97.3°F | Resp 40 | Ht 64.0 in | Wt 173.0 lb

## 2015-08-18 DIAGNOSIS — K6289 Other specified diseases of anus and rectum: Secondary | ICD-10-CM | POA: Diagnosis not present

## 2015-08-18 DIAGNOSIS — Z86010 Personal history of colonic polyps: Secondary | ICD-10-CM

## 2015-08-18 DIAGNOSIS — K621 Rectal polyp: Secondary | ICD-10-CM

## 2015-08-18 MED ORDER — SODIUM CHLORIDE 0.9 % IV SOLN: 500.0000 mL | INTRAVENOUS | Status: DC

## 2015-08-18 NOTE — Telephone Encounter (Signed)
Left pt. Message that 2 small areas of blood on bed were not concerning. Advised to call if excessive bleeding occurs.

## 2015-08-18 NOTE — Op Note (Signed)
[place]  [address]. [place], [address]   COLONOSCOPY PROCEDURE REPORT  PATIENT: Margaret Green, Margaret Green  MR#: [id number] BIRTHDATE: [birth year] , 36  yrs. old GENDER: female ENDOSCOPIST: Eustace Quail, MD REFERRED [id number] Kristian Covey, M.D. PROCEDURE DATE:  08/18/2015 PROCEDURE:   Colonoscopy with biopsy  ASA CLASS:   Class II INDICATIONS:follow up of recent resection of malignant rectal polyp (Dr. Havery Moros 06/15/2015 at 15 centimeters from the anal verge) with favorable histologic features with lack of invasion and clear margin. Negative CT and CEA. Gen. surgery did not recommend resection. Now for follow-up. MEDICATIONS: Monitored anesthesia care and Propofol 200 mg IV  DESCRIPTION OF PROCEDURE:   After the risks benefits and alternatives of the procedure were thoroughly explained, informed consent was obtained.  The digital rectal exam revealed no abnormalities of the rectum.   The LB [id number]  endoscope was introduced through the anus and advanced to the cecum, which was identified by both the appendix and ileocecal valve. No adverse events experienced.   The quality of the prep was excellent. (Suprep was used)  The instrument was then slowly withdrawn as the colon was fully examined. Estimated blood loss is zero unless otherwise noted in this procedure report.  COLON FINDINGS: The colonoscope was advanced to the cecal tip. Preparation was excellent.  Careful examination of the mucosa throughout revealed no abnormalities.  The previous polypectomy site was easily identified as the previously placed Endo Clip was in position.  The attachment site was unremarkable.  Multiple biopsies at this region were taken.  Retroflexed views revealed no abnormalities. The time to cecum = 4.0 Withdrawal time = 8.3   The scope was withdrawn and the procedure completed. COMPLICATIONS: There were no immediate complications.  ENDOSCOPIC IMPRESSION: 1. Endo  Clip at prior polypectomy site. Biopsies of the base taken. 2. Otherwise normal colonoscopy  RECOMMENDATIONS: 1. Repeat Colonoscopy in 1 year, with Dr. Havery Moros.  eSigned:  Eustace Quail, MD 08/18/2015 8:08 AM   cc: The Patient , Jolly Mango M.D.,and Marni Griffon.D.

## 2015-08-18 NOTE — Progress Notes (Signed)
Report to PACU, RN, vss, BBS= Clear.  

## 2015-08-18 NOTE — Progress Notes (Signed)
Pt. Had 2 very tiny areas of blood on bed.  Dr. Henrene Pastor advised.  No problems.  Will notify pt.

## 2015-08-18 NOTE — Progress Notes (Signed)
Called to room to assist during endoscopic procedure.  Patient ID and intended procedure confirmed with present staff. Received instructions for my participation in the procedure from the performing physician.  

## 2015-08-18 NOTE — Patient Instructions (Signed)
YOU HAD AN ENDOSCOPIC PROCEDURE TODAY AT [facility]:   Refer to the procedure report that was given to you for any specific questions about what was found during the examination.  If the procedure report does not answer your questions, please call your gastroenterologist to clarify.  If you requested that your care partner not be given the details of your procedure findings, then the procedure report has been included in a sealed envelope for you to review at your convenience later.  YOU SHOULD EXPECT: Some feelings of bloating in the abdomen. Passage of more gas than usual.  Walking can help get rid of the air that was put into your GI tract during the procedure and reduce the bloating. If you had a lower endoscopy (such as a colonoscopy or flexible sigmoidoscopy) you may notice spotting of blood in your stool or on the toilet paper. If you underwent a bowel prep for your procedure, you may not have a normal bowel movement for a few days.  Please Note:  You might notice some irritation and congestion in your nose or some drainage.  This is from the oxygen used during your procedure.  There is no need for concern and it should clear up in a day or so.  SYMPTOMS TO REPORT IMMEDIATELY:   Following lower endoscopy (colonoscopy or flexible sigmoidoscopy):  Excessive amounts of blood in the stool  Significant tenderness or worsening of abdominal pains  Swelling of the abdomen that is new, acute  Fever of 100F or higher   For urgent or emergent issues, a gastroenterologist can be reached at any hour by calling [phone].   DIET: Your first meal following the procedure should be a small meal and then it is ok to progress to your normal diet. Heavy or fried foods are harder to digest and may make you feel nauseous or bloated.  Likewise, meals heavy in dairy and vegetables can increase bloating.  Drink plenty of fluids but you should avoid alcoholic beverages for 24  hours.  ACTIVITY:  You should plan to take it easy for the rest of today and you should NOT DRIVE or use heavy machinery until tomorrow (because of the sedation medicines used during the test).    FOLLOW UP: Our staff will call the number listed on your records the next business day following your procedure to check on you and address any questions or concerns that you may have regarding the information given to you following your procedure. If we do not reach you, we will leave a message.  However, if you are feeling well and you are not experiencing any problems, there is no need to return our call.  We will assume that you have returned to your regular daily activities without incident.  If any biopsies were taken you will be contacted by phone or by letter within the next 1-3 weeks.  Please call us at [phone] if you have not heard about the biopsies in 3 weeks.    SIGNATURES/CONFIDENTIALITY: You and/or your care partner have signed paperwork which will be entered into your electronic medical record.  These signatures attest to the fact that that the information above on your After Visit Summary has been reviewed and is understood.  Full responsibility of the confidentiality of this discharge information lies with you and/or your care-partner.  Recall colonoscopy 1 year-2017

## 2015-08-19 ENCOUNTER — Telehealth: Payer: Self-pay | Admitting: *Deleted

## 2015-08-19 NOTE — Telephone Encounter (Signed)
  Follow up Call-  Call back number 08/18/2015 06/15/2015  Post procedure Call Back phone  # [phone]  Permission to leave phone message Yes Yes     No answer, left message.

## 2015-08-20 ENCOUNTER — Ambulatory Visit: Payer: BLUE CROSS/BLUE SHIELD | Admitting: Family Medicine

## 2015-08-24 ENCOUNTER — Encounter: Payer: Self-pay | Admitting: Internal Medicine

## 2015-08-27 ENCOUNTER — Ambulatory Visit (INDEPENDENT_AMBULATORY_CARE_PROVIDER_SITE_OTHER): Payer: BLUE CROSS/BLUE SHIELD | Admitting: Family Medicine

## 2015-08-27 ENCOUNTER — Encounter: Payer: Self-pay | Admitting: Family Medicine

## 2015-08-27 VITALS — BP 120/70 | HR 70 | Temp 98.6°F | Wt 171.0 lb

## 2015-08-27 DIAGNOSIS — F411 Generalized anxiety disorder: Secondary | ICD-10-CM

## 2015-08-27 DIAGNOSIS — R682 Dry mouth, unspecified: Secondary | ICD-10-CM | POA: Diagnosis not present

## 2015-08-27 DIAGNOSIS — M3500 Sicca syndrome, unspecified: Secondary | ICD-10-CM

## 2015-08-27 DIAGNOSIS — F329 Major depressive disorder, single episode, unspecified: Secondary | ICD-10-CM

## 2015-08-27 DIAGNOSIS — F32A Depression, unspecified: Secondary | ICD-10-CM

## 2015-08-27 DIAGNOSIS — K2100 Gastro-esophageal reflux disease with esophagitis, without bleeding: Secondary | ICD-10-CM

## 2015-08-27 DIAGNOSIS — H04123 Dry eye syndrome of bilateral lacrimal glands: Secondary | ICD-10-CM | POA: Diagnosis not present

## 2015-08-27 LAB — RHEUMATOID FACTOR: RHEUMATOID FACTOR: 11 [IU]/mL (ref <=14)

## 2015-08-27 NOTE — Assessment & Plan Note (Signed)
S: Followed by Dr. Havery Moros. Patient has been on multiple medications. Plan was for omeprazole but has hives allergy listed. Plan then was prevacid but patient states was $60 so she did not fill. Her friend gave her a sample of dexilant 60mg  delayed release and she states she has not felt so good in years. She had fried chicken last night with no issues.  A/P: Patient asked me to prescribe dexilant. i doubt it will be covered if aciphex and protonix have been denied without prior attempts on other PPI. For this reason, I encouraged her to take OTC prevacid and take at dose of 30mg . She will update Dr. Havery Moros after a 3 week trial or sooner if cannot tolerate.

## 2015-08-27 NOTE — Assessment & Plan Note (Signed)
S: patient believes she was diagnosed with Sjogren's about 10 years ago. She did see a rheumatologist at 1 point. Apparently not all of her labs were consistent with the diagnosis. She would like to be retested at this time since we cannot find prior labs. In regards to revise international classification criteria for Sjogren's: I. Ocular symptoms Has 3 out of 3 II. Oral symptoms. 3 out of 3 III. objective ocular symptoms. Reports on multiple occasions Schirmer test being performed and reportedly positive for dry eyes IV. Histopathology-not performed V. Salivary gland involvement- no official studies. VI: Autoantibodies: No records A/P: We will get labs including anti-Ro/SSA and/or anti-La/SSB antibodies, ANA, RF. If these are positive consider referral to ENT versus rheumatology

## 2015-08-27 NOTE — Assessment & Plan Note (Signed)
S: PHQ9 of 14 1 month ago now down to 5. Much improved.  No SI/HI remains A/P: continue 10mg  celexa as doing well (1/2 tab)

## 2015-08-27 NOTE — Assessment & Plan Note (Signed)
S: at last visit patient noted worsening anxiety despite prn xanax over prior 2-3 months. GAD7 scores 19 last visit and now down to 4.  Patient very pleased with improvement though still has some struggles. Took 1 xanax in last month A/P: continue celexa 10mg . Discussed increasing to 20mg  (max on PPI) but patient declined. Wants to trial same dose.

## 2015-08-27 NOTE — Progress Notes (Signed)
[name], MD  Subjective:  Margaret Green is a 54 y.o. year old very pleasant female patient who presents for/with See problem oriented charting ROS- complains of chronic dry eyes and mouth. History of multiple cavities despite good dental care. No SI HI. Admits to anxiety and some depressed mood  Past Medical History-  Patient Active Problem List   Diagnosis Date Noted  . Adenocarcinoma, colon ([place]) 07/23/2015    Priority: High  . Depression 07/23/2015    Priority: Medium  . Migraine 08/14/2014    Priority: Medium  . IBS (irritable bowel syndrome) 08/14/2014    Priority: Medium  . Essential hypertension, benign 08/14/2014    Priority: Medium  . GERD (gastroesophageal reflux disease) 08/14/2014    Priority: Medium  . Right thyroid nodule 09/24/2013    Priority: Medium  . GAD (generalized anxiety disorder) 09/29/2010    Priority: Medium  . Hyperlipidemia 04/13/2007    Priority: Medium  . Fibromyalgia 04/13/2007    Priority: Medium  . Hypothyroidism 04/03/2007    Priority: Medium  . Allergic rhinitis 08/14/2014    Priority: Low  . Neutropenia ([place]) 07/23/2009    Priority: Low  . ANEMIA, PERNICIOUS 02/24/2009    Priority: Low  . SPRAIN/STRAIN, LUMBAR REGION 04/16/2007    Priority: Low  . RAYNAUD'S DISEASE 04/13/2007    Priority: Low  . Osteoarthritis 04/13/2007    Priority: Low  . Dry mouth 08/27/2015  . Hoarseness 03/10/2015    Medications- reviewed and updated Current Outpatient Prescriptions  Medication Sig Dispense Refill  . Calcium Carb-Cholecalciferol 500-400 MG-UNIT CHEW Chew 500 mg by mouth daily.      . cetirizine (ZYRTEC) 10 MG tablet Take 10 mg by mouth daily.      . citalopram (CELEXA) 20 MG tablet Take 1 tablet (20 mg total) by mouth daily. 30 tablet 1  . estradiol (VIVELLE-DOT) 0.05 MG/24HR patch     . FINACEA 15 % cream   0  . hyoscyamine (LEVBID) 0.375 MG 12 hr tablet Take 1 tablet (0.375 mg total) by mouth 2 (two) times daily. 180 tablet  3  . lansoprazole (PREVACID) 30 MG capsule Take 1 capsule (30 mg total) by mouth daily at 12 noon. 90 capsule 1  . levothyroxine (SYNTHROID, LEVOTHROID) 75 MCG tablet Take 1 tablet (75 mcg total) by mouth daily. 90 tablet 3  . loratadine (CLARITIN) 10 MG tablet Take 10 mg by mouth daily.    Marland Kitchen losartan (COZAAR) 50 MG tablet take 1 tablet by mouth once daily NO MORE REFILLS WITHOUT OFFICE VISITS 30 tablet 5  . rosuvastatin (CRESTOR) 20 MG tablet Take 1 tablet (20 mg total) by mouth daily. 90 tablet 3  . tiZANidine (ZANAFLEX) 4 MG tablet take 1 tablet by mouth twice a day if needed 60 tablet 5  . ALPRAZolam (XANAX) 0.25 MG tablet take 1 tablet by twice a day as needed (Patient not taking: Reported on 08/27/2015) 60 tablet 2  . butalbital-aspirin-caffeine (FIORINAL) 50-325-40 MG per capsule take 1 capsule by mouth every 4 hours if needed (Patient not taking: Reported on 07/23/2015) 30 capsule 0  . cyclobenzaprine (FLEXERIL) 10 MG tablet Take 1 tablet (10 mg total) by mouth 3 (three) times daily as needed for muscle spasms. (Patient not taking: Reported on 07/23/2015) 30 tablet 0  . diphenoxylate-atropine (LOMOTIL) 2.5-0.025 MG per tablet Take 1 tablet by mouth 4 (four) times daily as needed. (Patient not taking: Reported on 07/23/2015) 30 tablet 4  . promethazine (PHENERGAN) 25 MG tablet  Take 25 mg by mouth every 6 (six) hours as needed. Reported on 08/27/2015    . SUMAtriptan (IMITREX) 100 MG tablet TAKE TABLETS BY MOUTH AS DIRECTED BY YOUR DOCTOR IF NEEDED (Patient not taking: Reported on 07/23/2015) 6 tablet 6   No current facility-administered medications for this visit.    Objective: BP 120/70 mmHg  Pulse 70  Temp(Src) 98.6 F (37 C)  Wt 171 lb (77.565 kg) Gen: NAD, resting comfortably CV: RRR no murmurs rubs or gallops Lungs: CTAB no crackles, wheeze, rhonchi Abdomen: soft/nontender/nondistended/normal bowel sounds. No rebound or guarding.  Ext: no edema Skin: warm, dry Neuro: grossly  normal, moves all extremities  Assessment/Plan:  GAD (generalized anxiety disorder) S: at last visit patient noted worsening anxiety despite prn xanax over prior 2-3 months. GAD7 scores 19 last visit and now down to 4.  Patient very pleased with improvement though still has some struggles. Took 1 xanax in last month A/P: continue celexa 10mg . Discussed increasing to 20mg  (max on PPI) but patient declined. Wants to trial same dose.   Depression S: PHQ9 of 14 1 month ago now down to 5. Much improved.  No SI/HI remains A/P: continue 10mg  celexa as doing well (1/2 tab)    GERD (gastroesophageal reflux disease) S: Followed by Dr. Havery Moros. Patient has been on multiple medications. Plan was for omeprazole but has hives allergy listed. Plan then was prevacid but patient states was $60 so she did not fill. Her friend gave her a sample of dexilant 60mg  delayed release and she states she has not felt so good in years. She had fried chicken last night with no issues.  A/P: Patient asked me to prescribe dexilant. i doubt it will be covered if aciphex and protonix have been denied without prior attempts on other PPI. For this reason, I encouraged her to take OTC prevacid and take at dose of 30mg . She will update Dr. Havery Moros after a 3 week trial or sooner if cannot tolerate.    Dry mouth S: patient believes she was diagnosed with Sjogren's about 10 years ago. She did see a rheumatologist at 1 point. Apparently not all of her labs were consistent with the diagnosis. She would like to be retested at this time since we cannot find prior labs. In regards to revise international classification criteria for Sjogren's: I. Ocular symptoms Has 3 out of 3 II. Oral symptoms. 3 out of 3 III. objective ocular symptoms. Reports on multiple occasions Schirmer test being performed and reportedly positive for dry eyes IV. Histopathology-not performed V. Salivary gland involvement- no official studies. VI:  Autoantibodies: No records A/P: We will get labs including anti-Ro/SSA and/or anti-La/SSB antibodies, ANA, RF. If these are positive consider referral to ENT versus rheumatology   Return precautions advised.   Orders Placed This Encounter  Procedures  . Sjogrens syndrome-A extractable nuclear antibody  . Sjogrens syndrome-B extractable nuclear antibody  . ANA  . Rheumatoid Factor

## 2015-08-27 NOTE — Patient Instructions (Signed)
Continue current medications Celexa 1/2 tab of 20mg - glad anxiety and depression are so much better.   Will reach out to Dr. Havery Moros to see if he is ok with me sending in dexilant or if he wants to but I think its reasonable to trial.   Labs before you go anti-Ro/SSA and/or anti-La/SSB antibodies, ANA, RF

## 2015-08-28 LAB — ANTI-NUCLEAR AB-TITER (ANA TITER): ANA Titer 1: 1:160 {titer} — ABNORMAL HIGH

## 2015-08-28 LAB — ANA: Anti Nuclear Antibody(ANA): POSITIVE — AB

## 2015-08-28 LAB — SJOGRENS SYNDROME-B EXTRACTABLE NUCLEAR ANTIBODY: SSB (La) (ENA) Antibody, IgG: <1

## 2015-08-28 LAB — SJOGRENS SYNDROME-A EXTRACTABLE NUCLEAR ANTIBODY: SSA (Ro) (ENA) Antibody, IgG: >8

## 2015-08-28 NOTE — Progress Notes (Signed)
Thanks very much for the note. Your plan sounds good. If the others don't work I can try getting her Dexilant and speak with her insurance company. She can call me to follow up for this if she needs it. Thanks again,

## 2015-08-29 NOTE — Addendum Note (Signed)
Addended by: Clyde Lundborg A on: 08/29/2015 09:04 AM   Modules accepted: Orders

## 2015-09-03 ENCOUNTER — Other Ambulatory Visit: Payer: Self-pay | Admitting: Family Medicine

## 2015-09-09 ENCOUNTER — Other Ambulatory Visit: Payer: Self-pay | Admitting: Family Medicine

## 2015-09-09 NOTE — Telephone Encounter (Signed)
Refill ok? 

## 2015-09-10 NOTE — Telephone Encounter (Signed)
Yes thanks 

## 2015-09-29 ENCOUNTER — Telehealth: Payer: Self-pay | Admitting: Family Medicine

## 2015-09-29 NOTE — Telephone Encounter (Signed)
Please offer our condolences. In a patient with a lower risk medication profile I would consider it but I would not advise this without an office visit. One option would be to push her xanax to bedtime which often helps with sleep.

## 2015-09-29 NOTE — Telephone Encounter (Signed)
Patient wanted some Ambien because of a death in the family she hasn't been able to sleep.  She would also like her Phenergan for nausea refilled.

## 2015-09-29 NOTE — Telephone Encounter (Signed)
See below

## 2015-09-30 MED ORDER — PROMETHAZINE HCL 25 MG PO TABS: 25.0000 mg | ORAL_TABLET | Freq: Four times a day (QID) | ORAL | Status: DC | PRN

## 2015-09-30 NOTE — Telephone Encounter (Signed)
Medication sent in. 

## 2015-09-30 NOTE — Telephone Encounter (Signed)
Pt aware and would like to know if she can have renewed phenegran Rx?

## 2015-09-30 NOTE — Telephone Encounter (Signed)
Yes thanks 

## 2015-10-01 ENCOUNTER — Telehealth: Payer: Self-pay | Admitting: Family Medicine

## 2015-10-01 NOTE — Telephone Encounter (Signed)
Pt needs an appointment to discuss getting on ambien. Pt can not come in today due a funeral. Can I use phy slot Monday 10-05-15 or sda slot ?

## 2015-10-01 NOTE — Telephone Encounter (Signed)
Yes thanks to physical slot. Please make sure to either fill full slot or make it into 2 acute slots.

## 2015-10-01 NOTE — Telephone Encounter (Signed)
See below

## 2015-10-02 NOTE — Telephone Encounter (Signed)
Pt has been sch

## 2015-10-03 ENCOUNTER — Other Ambulatory Visit: Payer: Self-pay | Admitting: Family Medicine

## 2015-10-05 ENCOUNTER — Ambulatory Visit (INDEPENDENT_AMBULATORY_CARE_PROVIDER_SITE_OTHER): Payer: BLUE CROSS/BLUE SHIELD | Admitting: Family Medicine

## 2015-10-05 ENCOUNTER — Encounter: Payer: Self-pay | Admitting: Family Medicine

## 2015-10-05 VITALS — BP 120/72 | HR 63 | Temp 98.3°F | Wt 167.0 lb

## 2015-10-05 DIAGNOSIS — F32A Depression, unspecified: Secondary | ICD-10-CM

## 2015-10-05 DIAGNOSIS — G4700 Insomnia, unspecified: Secondary | ICD-10-CM

## 2015-10-05 DIAGNOSIS — F329 Major depressive disorder, single episode, unspecified: Secondary | ICD-10-CM | POA: Diagnosis not present

## 2015-10-05 DIAGNOSIS — F411 Generalized anxiety disorder: Secondary | ICD-10-CM

## 2015-10-05 MED ORDER — ALPRAZOLAM 0.25 MG PO TABS: ORAL_TABLET | ORAL | Status: DC

## 2015-10-05 MED ORDER — ZOLPIDEM TARTRATE 5 MG PO TABS: 5.0000 mg | ORAL_TABLET | Freq: Every evening | ORAL | Status: DC | PRN

## 2015-10-05 NOTE — Assessment & Plan Note (Addendum)
Insomnia anxiety S: Brother in law lost age 54- getting out of car to help a dog- hit by another car and died. Huge stressor on patient and sister that she is very close with. Her baseline anxiety has obviously increased. She is taking xanax Taking 1/2 BID - one in evening to help sleep. 10:30- 2 PM with xanax, helps get to sleep but will not help with maintenance. She is requesting something to help her sleep better and wants to work on controlling anxiety better as well. She has no SI/HI and her depressed mood is worse but appropriately so with family losss A/P: continue celexa 10mg  with reasonably controlled depression. Refilled xanax with plan to take 1/2 in daytime most days at least over next month. Stop nighttime dose and instead take ambien 1/2 tab to full tab of 5mg . Plan is to use this more regularly at first then more sparingly as gets out from death of brother in law. She did need a new script of xanax as switching to [facility] which has a drivethrough and more convenient for her. She shows me old bottle of #60 which still has 2 refills from several months ago.

## 2015-10-05 NOTE — Progress Notes (Signed)
[name], MD  Subjective:  Margaret Green is a 55 y.o. year old very pleasant female patient who presents for/with See problem oriented charting ROS- no si/hi. No headache or blurry vision. No chest pain or shortness of breath.   Past Medical History-  Patient Active Problem List   Diagnosis Date Noted  . Dry mouth 08/27/2015    Priority: High  . Adenocarcinoma, colon ([place]) 07/23/2015    Priority: High  . Depression 07/23/2015    Priority: Medium  . Migraine 08/14/2014    Priority: Medium  . IBS (irritable bowel syndrome) 08/14/2014    Priority: Medium  . Essential hypertension, benign 08/14/2014    Priority: Medium  . GERD (gastroesophageal reflux disease) 08/14/2014    Priority: Medium  . Right thyroid nodule 09/24/2013    Priority: Medium  . GAD (generalized anxiety disorder) 09/29/2010    Priority: Medium  . Hyperlipidemia 04/13/2007    Priority: Medium  . Fibromyalgia 04/13/2007    Priority: Medium  . Hypothyroidism 04/03/2007    Priority: Medium  . Allergic rhinitis 08/14/2014    Priority: Low  . Neutropenia ([place]) 07/23/2009    Priority: Low  . ANEMIA, PERNICIOUS 02/24/2009    Priority: Low  . SPRAIN/STRAIN, LUMBAR REGION 04/16/2007    Priority: Low  . RAYNAUD'S DISEASE 04/13/2007    Priority: Low  . Osteoarthritis 04/13/2007    Priority: Low  . Hoarseness 03/10/2015    Medications- reviewed and updated Current Outpatient Prescriptions  Medication Sig Dispense Refill  . Calcium Carb-Cholecalciferol 500-400 MG-UNIT CHEW Chew 500 mg by mouth daily.      . cetirizine (ZYRTEC) 10 MG tablet Take 10 mg by mouth daily.      . cevimeline (EVOXAC) 30 MG capsule Take 30 mg by mouth 3 (three) times daily.    . citalopram (CELEXA) 20 MG tablet Take 1 tablet (20 mg total) by mouth daily. 30 tablet 1  . estradiol (VIVELLE-DOT) 0.05 MG/24HR patch     . FINACEA 15 % cream   0  . hydroxychloroquine (PLAQUENIL) 200 MG tablet Take by mouth 2 (two) times daily.     . hyoscyamine (LEVBID) 0.375 MG 12 hr tablet take 1 tablet by mouth twice a day 60 tablet 2  . lansoprazole (PREVACID) 30 MG capsule Take 1 capsule (30 mg total) by mouth daily at 12 noon. 90 capsule 1  . levothyroxine (SYNTHROID, LEVOTHROID) 75 MCG tablet Take 1 tablet (75 mcg total) by mouth daily. 90 tablet 3  . loratadine (CLARITIN) 10 MG tablet Take 10 mg by mouth daily.    Marland Kitchen losartan (COZAAR) 50 MG tablet take 1 tablet by mouth once daily NO MORE REFILLS WITHOUT OFFICE VISITS 30 tablet 5  . rosuvastatin (CRESTOR) 20 MG tablet take 1 tablet by mouth once daily 90 tablet 1  . SYNTHROID 75 MCG tablet take 1 tablet by mouth once daily 90 tablet 1  . tiZANidine (ZANAFLEX) 4 MG tablet take 1 tablet by mouth twice a day if needed 60 tablet 5  . ALPRAZolam (XANAX) 0.25 MG tablet take 1 tablet by twice a day as needed 60 tablet 2  . butalbital-aspirin-caffeine (FIORINAL) 50-325-40 MG capsule take 1 capsule by mouth every 4 hours if needed (Patient not taking: Reported on 10/05/2015) 30 capsule 5  . diphenoxylate-atropine (LOMOTIL) 2.5-0.025 MG per tablet Take 1 tablet by mouth 4 (four) times daily as needed. (Patient not taking: Reported on 07/23/2015) 30 tablet 4  . promethazine (PHENERGAN) 25 MG tablet Take  1 tablet (25 mg total) by mouth every 6 (six) hours as needed. Reported on 08/27/2015 (Patient not taking: Reported on 10/05/2015) 30 tablet 5  . SUMAtriptan (IMITREX) 100 MG tablet TAKE TABLETS BY MOUTH AS DIRECTED BY YOUR DOCTOR IF NEEDED (Patient not taking: Reported on 07/23/2015) 6 tablet 6  . zolpidem (AMBIEN) 5 MG tablet Take 1 tablet (5 mg total) by mouth at bedtime as needed for sleep. 30 tablet 3   No current facility-administered medications for this visit.    Objective: BP 120/72 mmHg  Pulse 63  Temp(Src) 98.3 F (36.8 C)  Wt 167 lb (75.751 kg) Gen: NAD, tearful at times  Assessment/Plan:  Depression Insomnia Anxiety- GAD S: Brother in law lost age 76- getting out of car  to help a dog- hit by another car and died. Huge stressor on patient and sister that she is very close with. Her baseline anxiety has obviously increased. She is taking xanax Taking 1/2 BID - one in evening to help sleep. 10:30- 2 PM with xanax, helps get to sleep but will not help with maintenance. She is requesting something to help her sleep better and wants to work on controlling anxiety better as well. She has no SI/HI and her depressed mood is worse but appropriately so with family losss A/P: continue celexa 10mg  with reasonably controlled depression. Refilled xanax with plan to take 1/2 in daytime most days at least over next month. Stop nighttime dose and instead take ambien 1/2 tab to full tab of 5mg . Plan is to use this more regularly at first then more sparingly as gets out from death of brother in law. She did need a new script of xanax as [facility] which has a drivethrough and more convenient for her. She shows me old bottle of #60 which still has 2 refills from several months ago.    6 months with sooner return advised  Meds ordered this encounter  Medications  . cevimeline (EVOXAC) 30 MG capsule    Sig: Take 30 mg by mouth 3 (three) times daily.  . hydroxychloroquine (PLAQUENIL) 200 MG tablet    Sig: Take by mouth 2 (two) times daily.  Marland Kitchen ALPRAZolam (XANAX) 0.25 MG tablet    Sig: take 1 tablet by twice a day as needed    Dispense:  60 tablet    Refill:  2  . zolpidem (AMBIEN) 5 MG tablet    Sig: Take 1 tablet (5 mg total) by mouth at bedtime as needed for sleep.    Dispense:  30 tablet    Refill:  3   >50% of 25 minute office visit was spent on counseling (driving precautions, being very careful as on several controlled substances including xanax, fiorinal, lomotil, and now [place]; discussing risks of these medicines, consoling patient over lost family member). and coordination of care

## 2015-10-05 NOTE — Patient Instructions (Signed)
So sorry for your loss  Take 0.5 to 1 xanax in daytime as needed for anxiety Instead of nighttime dose, take 0.5 to 1 ambien  This should definitely help with going to sleep but may not help with getting more than 4-5 hours of sleep- if you notice this is a problem, we may trial the extended release instead  Avoid driving for 6-8 hours after xanax and 8 hours after [place]  My hope is that these prescriptions can last you until our next appointment in about 54months

## 2015-11-03 ENCOUNTER — Other Ambulatory Visit: Payer: Self-pay | Admitting: Family Medicine

## 2015-11-03 MED ORDER — LOSARTAN POTASSIUM 50 MG PO TABS: ORAL_TABLET | ORAL | Status: DC

## 2015-11-15 ENCOUNTER — Other Ambulatory Visit: Payer: Self-pay | Admitting: Family Medicine

## 2015-11-18 ENCOUNTER — Ambulatory Visit (INDEPENDENT_AMBULATORY_CARE_PROVIDER_SITE_OTHER): Payer: BLUE CROSS/BLUE SHIELD | Admitting: Family Medicine

## 2015-11-18 ENCOUNTER — Encounter: Payer: Self-pay | Admitting: Family Medicine

## 2015-11-18 VITALS — BP 110/80 | HR 96 | Temp 99.3°F | Wt 164.0 lb

## 2015-11-18 DIAGNOSIS — R52 Pain, unspecified: Secondary | ICD-10-CM | POA: Diagnosis not present

## 2015-11-18 DIAGNOSIS — R05 Cough: Secondary | ICD-10-CM | POA: Diagnosis not present

## 2015-11-18 DIAGNOSIS — J111 Influenza due to unidentified influenza virus with other respiratory manifestations: Secondary | ICD-10-CM

## 2015-11-18 DIAGNOSIS — R059 Cough, unspecified: Secondary | ICD-10-CM

## 2015-11-18 LAB — POCT INFLUENZA A/B
INFLUENZA A, POC: NEGATIVE
INFLUENZA B, POC: NEGATIVE

## 2015-11-18 MED ORDER — OSELTAMIVIR PHOSPHATE 75 MG PO CAPS: 75.0000 mg | ORAL_CAPSULE | Freq: Two times a day (BID) | ORAL | Status: DC

## 2015-11-18 MED ORDER — ALBUTEROL SULFATE HFA 108 (90 BASE) MCG/ACT IN AERS: 2.0000 | INHALATION_SPRAY | Freq: Four times a day (QID) | RESPIRATORY_TRACT | Status: DC | PRN

## 2015-11-18 NOTE — Patient Instructions (Signed)
Cough/fever/fatigue/body aches concering for influenza.  Also with coarse breath sounds concerning for bronchitis.  Pretest probability high and despite negative flu swab today- suspect influenza and treat with tamiflu for 5 days.   Does have some coarse breath sounds and think bronchitis also reasonably possible. Will give albuterol inhaler. Also may use delysm cough medicine if needed.   Time will really tell in this case- should be significantly better by next Monday if Flu. If bronchitis will have persistent symptoms at 10-14 days with possible issues for 4-6 weeks. If patient continues to have fever into next week will call. If she continues to have cough at 10 days- consider tessalon perles or hycodan cough syrup and/or prednisone.   Finally, we reviewed reasons to return to care including if symptoms worsen or persist past 10 days or new concerns arise.  Meds ordered this encounter  Medications  . oseltamivir (TAMIFLU) 75 MG capsule    Sig: Take 1 capsule (75 mg total) by mouth 2 (two) times daily.    Dispense:  10 capsule    Refill:  0  . albuterol (PROVENTIL HFA;VENTOLIN HFA) 108 (90 Base) MCG/ACT inhaler    Sig: Inhale 2 puffs into the lungs every 6 (six) hours as needed for wheezing or shortness of breath.    Dispense:  1 Inhaler    Refill:  0

## 2015-11-18 NOTE — Progress Notes (Signed)
PCP: [name], MD  Subjective:  Margaret Green is a 55 y.o. year old very pleasant female patient who presents with Flu like symptoms including deep cough, body aches, fever -started: suddenly yesterday morning with fevers up to 101.6 yesterday evening. Has been scheduling tylenol and ibuprofen since -symptoms show no change -previous treatments: tylenol, ibuprofen for fever -sick contacts/travel/risks: denies flu exposure.   ROS-denies SOB, vomiting, diarrhea, facial pain, tooth pain. Endorses appetite decrease, fever, occasional wheeze.   Pertinent Past Medical History- sjogren's syndrome, colon adenocarcinoma, IBS, hypothyroid, hyperlipidemia, fibromyalgia  Medications- reviewed  Current Outpatient Prescriptions  Medication Sig Dispense Refill  . Calcium Carb-Cholecalciferol 500-400 MG-UNIT CHEW Chew 500 mg by mouth daily.      . cetirizine (ZYRTEC) 10 MG tablet Take 10 mg by mouth daily.      . cevimeline (EVOXAC) 30 MG capsule Take 30 mg by mouth 3 (three) times daily.    . citalopram (CELEXA) 20 MG tablet TAKE ONE TABLET BY MOUTH ONCE DAILY 30 tablet 5  . estradiol (VIVELLE-DOT) 0.05 MG/24HR patch     . FINACEA 15 % cream   0  . hydroxychloroquine (PLAQUENIL) 200 MG tablet Take by mouth 2 (two) times daily.    . hyoscyamine (LEVBID) 0.375 MG 12 hr tablet take 1 tablet by mouth twice a day 60 tablet 2  . lansoprazole (PREVACID) 30 MG capsule Take 1 capsule (30 mg total) by mouth daily at 12 noon. 90 capsule 1  . levothyroxine (SYNTHROID, LEVOTHROID) 75 MCG tablet Take 1 tablet (75 mcg total) by mouth daily. 90 tablet 3  . loratadine (CLARITIN) 10 MG tablet Take 10 mg by mouth daily.    Marland Kitchen losartan (COZAAR) 50 MG tablet take 1 tablet by mouth once daily NO MORE REFILLS WITHOUT OFFICE VISITS 30 tablet 5  . rosuvastatin (CRESTOR) 20 MG tablet take 1 tablet by mouth once daily 90 tablet 1  . SYNTHROID 75 MCG tablet take 1 tablet by mouth once daily 90 tablet 1  . ALPRAZolam  (XANAX) 0.25 MG tablet take 1 tablet by twice a day as needed (Patient not taking: Reported on 11/18/2015) 60 tablet 2  . butalbital-aspirin-caffeine (FIORINAL) 50-325-40 MG capsule take 1 capsule by mouth every 4 hours if needed (Patient not taking: Reported on 10/05/2015) 30 capsule 5  . diphenoxylate-atropine (LOMOTIL) 2.5-0.025 MG per tablet Take 1 tablet by mouth 4 (four) times daily as needed. (Patient not taking: Reported on 07/23/2015) 30 tablet 4  . promethazine (PHENERGAN) 25 MG tablet Take 1 tablet (25 mg total) by mouth every 6 (six) hours as needed. Reported on 08/27/2015 (Patient not taking: Reported on 11/18/2015) 30 tablet 5  . SUMAtriptan (IMITREX) 100 MG tablet TAKE TABLETS BY MOUTH AS DIRECTED BY YOUR DOCTOR IF NEEDED (Patient not taking: Reported on 07/23/2015) 6 tablet 6  . tiZANidine (ZANAFLEX) 4 MG tablet take 1 tablet by mouth twice a day if needed (Patient not taking: Reported on 11/18/2015) 60 tablet 5  . zolpidem (AMBIEN) 5 MG tablet Take 1 tablet (5 mg total) by mouth at bedtime as needed for sleep. (Patient not taking: Reported on 11/18/2015) 30 tablet 3   No current facility-administered medications for this visit.    Objective: BP 110/80 mmHg  Pulse 96  Temp(Src) 99.3 F (37.4 C)  Wt 164 lb (74.39 kg)  SpO2 95% Gen: NAD, appears very fatigued, wearing mask HEENT: Turbinates erythematous with no drainage, TM normal, pharynx mildly erythematous with no tonsilar exudate or edema, no sinus  tenderness.  CV: RRR no murmurs rubs or gallops Lungs: diffuse coarse breath sounds with no wheeze. No crackles.  Abdomen: soft/nontender/nondistended/normal bowel sounds.  Ext: no edema Skin: warm, dry, no rash Neuro: grossly normal, moves all extremities  Assessment/Plan:  Cough/fever/fatigue/body aches concering for influenza.  Also with coarse breath sounds concerning for bronchitis.  Pretest probability high and despite negative flu swab today- suspect influenza and treat with  tamiflu for 5 days.   Does have some coarse breath sounds and think bronchitis also reasonably possible. Will give albuterol inhaler. Also may use delysm cough medicine if needed.   Time will really tell in this case- should be significantly better by next Monday if Flu. If bronchitis will have persistent symptoms at 10-14 days with possible issues for 4-6 weeks. If patient continues to have fever into next week will call. If she continues to have cough at 10 days- consider tessalon perles or hycodan cough syrup and/or prednisone.   Higher risk patient with multiple comorbidities- fortunately no lung disease  Finally, we reviewed reasons to return to care including if symptoms worsen or persist past 10 days or new concerns arise.  Meds ordered this encounter  Medications  . oseltamivir (TAMIFLU) 75 MG capsule    Sig: Take 1 capsule (75 mg total) by mouth 2 (two) times daily.    Dispense:  10 capsule    Refill:  0  . albuterol (PROVENTIL HFA;VENTOLIN HFA) 108 (90 Base) MCG/ACT inhaler    Sig: Inhale 2 puffs into the lungs every 6 (six) hours as needed for wheezing or shortness of breath.    Dispense:  1 Inhaler    Refill:  0

## 2015-12-01 ENCOUNTER — Encounter: Payer: Self-pay | Admitting: Family Medicine

## 2015-12-01 ENCOUNTER — Ambulatory Visit (INDEPENDENT_AMBULATORY_CARE_PROVIDER_SITE_OTHER): Payer: BLUE CROSS/BLUE SHIELD | Admitting: Family Medicine

## 2015-12-01 VITALS — BP 118/72 | HR 75 | Temp 98.9°F | Wt 170.0 lb

## 2015-12-01 DIAGNOSIS — M545 Low back pain, unspecified: Secondary | ICD-10-CM

## 2015-12-01 LAB — POC URINALSYSI DIPSTICK (AUTOMATED)
Bilirubin, UA: NEGATIVE
Blood, UA: NEGATIVE
Glucose, UA: NEGATIVE
Ketones, UA: NEGATIVE
Nitrite, UA: NEGATIVE
PROTEIN UA: NEGATIVE
Spec Grav, UA: 1.015
UROBILINOGEN UA: 0.2
pH, UA: 6

## 2015-12-01 MED ORDER — TIZANIDINE HCL 6 MG PO CAPS
6.0000 mg | ORAL_CAPSULE | Freq: Two times a day (BID) | ORAL | Status: DC | PRN
Start: 2015-12-01 — End: 2015-12-01

## 2015-12-01 NOTE — Patient Instructions (Signed)
Check urine before you go- if any blood would consider CT scan Let me know if you develop frequent urination or burning with peeing  Trial zanaflex 6mg  twice a day since that seems to help you the most  We will call you within a week about your referral to Physical therapy. If you do not hear within 2 weeks, give [place] a call. Need core strengthening to reduce likelihood of recurrence  If back pain lasts more than 4-6 weeks or is worsening or new symptoms develop please return to care.

## 2015-12-01 NOTE — Progress Notes (Signed)
[name], MD  Subjective:  Margaret Green is a 55 y.o. year old very pleasant female patient who presents for/with See problem oriented charting  ROS-No saddle anesthesia, bladder incontinence, fecal incontinence, weakness in extremity, numbness or tingling in extremity. History negative for trauma, history of cancer, fever, chills, unintentional weight loss, recent bacterial infection- did have flu recently which has resolved, recent IV drug use, HIV, pain worse at night or while supine.   Past Medical History-  Patient Active Problem List   Diagnosis Date Noted  . Dry mouth 08/27/2015    Priority: High  . Adenocarcinoma, colon ([place]) 07/23/2015    Priority: High  . Depression 07/23/2015    Priority: Medium  . Migraine 08/14/2014    Priority: Medium  . IBS (irritable bowel syndrome) 08/14/2014    Priority: Medium  . Essential hypertension, benign 08/14/2014    Priority: Medium  . GERD (gastroesophageal reflux disease) 08/14/2014    Priority: Medium  . Right thyroid nodule 09/24/2013    Priority: Medium  . GAD (generalized anxiety disorder) 09/29/2010    Priority: Medium  . Hyperlipidemia 04/13/2007    Priority: Medium  . Fibromyalgia 04/13/2007    Priority: Medium  . Hypothyroidism 04/03/2007    Priority: Medium  . Allergic rhinitis 08/14/2014    Priority: Low  . Neutropenia ([place]) 07/23/2009    Priority: Low  . ANEMIA, PERNICIOUS 02/24/2009    Priority: Low  . SPRAIN/STRAIN, LUMBAR REGION 04/16/2007    Priority: Low  . RAYNAUD'S DISEASE 04/13/2007    Priority: Low  . Osteoarthritis 04/13/2007    Priority: Low  . Hoarseness 03/10/2015    Medications- reviewed and updated Current Outpatient Prescriptions  Medication Sig Dispense Refill  . Calcium Carb-Cholecalciferol 500-400 MG-UNIT CHEW Chew 500 mg by mouth daily.      . cetirizine (ZYRTEC) 10 MG tablet Take 10 mg by mouth daily.      . cevimeline (EVOXAC) 30 MG capsule Take 30 mg by mouth 3 (three)  times daily.    . citalopram (CELEXA) 20 MG tablet TAKE ONE TABLET BY MOUTH ONCE DAILY 30 tablet 5  . diphenoxylate-atropine (LOMOTIL) 2.5-0.025 MG per tablet Take 1 tablet by mouth 4 (four) times daily as needed. 30 tablet 4  . estradiol (VIVELLE-DOT) 0.05 MG/24HR patch     . FINACEA 15 % cream   0  . hydroxychloroquine (PLAQUENIL) 200 MG tablet Take by mouth 2 (two) times daily.    . hyoscyamine (LEVBID) 0.375 MG 12 hr tablet take 1 tablet by mouth twice a day 60 tablet 2  . lansoprazole (PREVACID) 30 MG capsule Take 1 capsule (30 mg total) by mouth daily at 12 noon. 90 capsule 1  . levothyroxine (SYNTHROID, LEVOTHROID) 75 MCG tablet Take 1 tablet (75 mcg total) by mouth daily. 90 tablet 3  . loratadine (CLARITIN) 10 MG tablet Take 10 mg by mouth daily.    Marland Kitchen losartan (COZAAR) 50 MG tablet take 1 tablet by mouth once daily NO MORE REFILLS WITHOUT OFFICE VISITS 30 tablet 5  . rosuvastatin (CRESTOR) 20 MG tablet take 1 tablet by mouth once daily 90 tablet 1  . SYNTHROID 75 MCG tablet take 1 tablet by mouth once daily 90 tablet 1  . albuterol (PROVENTIL HFA;VENTOLIN HFA) 108 (90 Base) MCG/ACT inhaler Inhale 2 puffs into the lungs every 6 (six) hours as needed for wheezing or shortness of breath. (Patient not taking: Reported on 12/01/2015) 1 Inhaler 0  . ALPRAZolam (XANAX) 0.25 MG tablet take  1 tablet by twice a day as needed (Patient not taking: Reported on 11/18/2015) 60 tablet 2  . butalbital-aspirin-caffeine (FIORINAL) 50-325-40 MG capsule take 1 capsule by mouth every 4 hours if needed (Patient not taking: Reported on 10/05/2015) 30 capsule 5  . promethazine (PHENERGAN) 25 MG tablet Take 1 tablet (25 mg total) by mouth every 6 (six) hours as needed. Reported on 08/27/2015 (Patient not taking: Reported on 11/18/2015) 30 tablet 5  . SUMAtriptan (IMITREX) 100 MG tablet TAKE TABLETS BY MOUTH AS DIRECTED BY YOUR DOCTOR IF NEEDED (Patient not taking: Reported on 07/23/2015) 6 tablet 6  . tiZANidine  (ZANAFLEX) 4 MG tablet take 1 tablet by mouth twice a day if needed (Patient not taking: Reported on 11/18/2015) 60 tablet 5  . zolpidem (AMBIEN) 5 MG tablet Take 1 tablet (5 mg total) by mouth at bedtime as needed for sleep. (Patient not taking: Reported on 11/18/2015) 30 tablet 3   No current facility-administered medications for this visit.    Objective: BP 118/72 mmHg  Pulse 75  Temp(Src) 98.9 F (37.2 C)  Wt 170 lb (77.111 kg) Gen: NAD, appears somewhat uncomfortable in chair CV: RRR no murmurs rubs or gallops Lungs: CTAB no crackles, wheeze, rhonchi Abdomen: soft/nontender/nondistended/normal bowel sounds. No rebound or guarding.  Ext: no edema Skin: warm, dry Back - Normal skin, Spine with normal alignment and no deformity.  No tenderness to vertebral process palpation.  Paraspinous muscles are very tender and tight in right low back with spasm.   Range of motion is full at neck and lumbar sacral regions (though stands up after touching toes very slowly). Negative Straight leg raise.  Neuro- no saddle anesthesia, 5/5 strength lower extremities, 2+ reflexes  Assessment/Plan:  Right low back pain pain S: started Wednesday or Thursday last week with mild soreness in right low back including somewhat into CVA area. Friday night woke up with spasms in her right lwo back felt like charleyhorse up to 10/10. pain Has occurred everynight and woke her up in the middle of the night. Has moved more into right side as well. Worse with twisting motions. Taking regular zanaflex, ibuprofen, heat and ice. Muscle relaxant seems to help the most. zanaflex gets down to 1-2 but still wakes up in middle of night with the pain when it wears off. Daytime pain about 5/10.  A/P: UA without blood- given tenderness on exam doubt UTI or pyelonephritis or kidney stone. We will increase zanaflex short term #30 to 6mg  tablets. Mainstay of treatment will be referral to PT for core strengthening. Discussed likely 4-6 week  course- if persists past that point or worsens or new symptoms  follow up.   Higher risk patient already on xanax, fiorinal at baseline. History of fibromyalgia so may have lower pain tolerance  Orders Placed This Encounter  Procedures  . Ambulatory referral to Physical Therapy    Referral Priority:  Routine    Referral Type:  Physical Medicine    Referral Reason:  Specialty Services Required    Requested Specialty:  Physical Therapy    Number of Visits Requested:  1  . POCT Urinalysis Dipstick (Automated)   Meds ordered this encounter  Medications  .  tizanidine (ZANAFLEX) 6 MG capsule    Sig: Take 1 capsule (6 mg total) by mouth 2 (two) times daily as needed for muscle spasms.    Dispense:  30 capsule    Refill:  0  Printed and given to patient then discontinued

## 2016-04-13 ENCOUNTER — Other Ambulatory Visit: Payer: Self-pay | Admitting: Family Medicine

## 2016-04-14 NOTE — Telephone Encounter (Signed)
OK to refill

## 2016-04-15 NOTE — Telephone Encounter (Signed)
Yes thanks, #30 with 1 refill

## 2016-04-29 ENCOUNTER — Telehealth: Payer: Self-pay

## 2016-04-29 ENCOUNTER — Other Ambulatory Visit: Payer: Self-pay | Admitting: Family Medicine

## 2016-04-29 MED ORDER — BUTALBITAL-ASPIRIN-CAFFEINE 50-325-40 MG PO CAPS: ORAL_CAPSULE | ORAL | 5 refills | Status: DC

## 2016-04-29 NOTE — Telephone Encounter (Signed)
Rx faxed to pharmacy  

## 2016-04-29 NOTE — Telephone Encounter (Signed)
Received refill request from [facility] on Ascomp-codeine, but it is not on patient's medication list. Called the patient and she said that she usually gets the Fiorinal, but the Ascomp is what [facility] gave her when the prescription was sent in. Patient said that she is okay with the Fiorinal and she actually prefers that one.

## 2016-04-29 NOTE — Telephone Encounter (Signed)
Yes thanks, may fill with 5 refills

## 2016-04-29 NOTE — Telephone Encounter (Signed)
im ok with fiornal refill

## 2016-05-07 ENCOUNTER — Other Ambulatory Visit: Payer: Self-pay | Admitting: Family Medicine

## 2016-06-17 ENCOUNTER — Encounter: Payer: Self-pay | Admitting: Gastroenterology

## 2016-07-04 ENCOUNTER — Telehealth: Payer: Self-pay | Admitting: Gastroenterology

## 2016-07-04 NOTE — Telephone Encounter (Signed)
Yes that sounds fine, we can give her aciphex 40mg  once daily for 30 days and see if this helps. thanks

## 2016-07-04 NOTE — Telephone Encounter (Signed)
Almyra Free this patient had an EGD a year ago and don't think it will help [place] manage these symptoms. Is she on a PPI at this time, and if so, which one is she taking? If not on anything she can try omeprazole or protonix 40mg  q day to BID as a trial to see if this helps. Otherwise it would probably be better to see her in clinic to discuss symptoms and discuss further workup if needed. thanks

## 2016-07-04 NOTE — Telephone Encounter (Signed)
Spoke to patient, she states that she has been having "pressure in her throat with occasional acid that comes back up into her mouth". She has tried numerous OTC acid relief products that are not helping, and states that her insurance will not cover the cost of the other medications that Dr. Havery Moros wanted her to try. Patient is wondering if she should have another EGD same day as her colonoscopy. Please advise.

## 2016-07-04 NOTE — Telephone Encounter (Signed)
Spoke to patient and after talking to her and looking through her history, she has tried prescription and OTC omeprazole, she has also tried Protonix, both medications have not helped her. Does have possible allergy to omeprazole (hives). States that she is currently taking OTC Zantac 150 mg bid. She does participate with [employer] now and she did find out that she can get Aciphex, at an affordable price, without going through her insurance. Questioning if this is something you would prescribe for her. Please advise.

## 2016-07-05 ENCOUNTER — Other Ambulatory Visit: Payer: Self-pay

## 2016-07-05 MED ORDER — RABEPRAZOLE SODIUM 20 MG PO TBEC: 40.0000 mg | DELAYED_RELEASE_TABLET | Freq: Every day | ORAL | 0 refills | Status: DC

## 2016-07-05 NOTE — Telephone Encounter (Signed)
Called patient let her know that the Aciphex 40 mg was sent to her pharmacy. She understands to let us know how it is working.

## 2016-07-12 ENCOUNTER — Other Ambulatory Visit: Payer: Self-pay | Admitting: Family Medicine

## 2016-07-12 NOTE — Telephone Encounter (Signed)
Yes thanks 

## 2016-08-01 ENCOUNTER — Other Ambulatory Visit: Payer: Self-pay | Admitting: Family Medicine

## 2016-08-08 ENCOUNTER — Ambulatory Visit (AMBULATORY_SURGERY_CENTER): Payer: Self-pay | Admitting: *Deleted

## 2016-08-08 VITALS — Ht 64.0 in | Wt 186.0 lb

## 2016-08-08 DIAGNOSIS — Z86010 Personal history of colonic polyps: Secondary | ICD-10-CM

## 2016-08-08 MED ORDER — NA SULFATE-K SULFATE-MG SULF 17.5-3.13-1.6 GM/177ML PO SOLN: 1.0000 | Freq: Once | ORAL | 0 refills | Status: AC

## 2016-08-08 NOTE — Progress Notes (Signed)
No egg or soy allergy known to patient  No issues with past sedation with any surgeries  or procedures, no intubation problems - propofol has done well  No diet pills per patient No home 02 use per patient  No blood thinners per patient  Pt denies issues with constipation  No A fib or A flutter

## 2016-08-14 ENCOUNTER — Other Ambulatory Visit: Payer: Self-pay | Admitting: Family Medicine

## 2016-08-19 ENCOUNTER — Ambulatory Visit (AMBULATORY_SURGERY_CENTER): Payer: BLUE CROSS/BLUE SHIELD | Admitting: Gastroenterology

## 2016-08-19 ENCOUNTER — Encounter: Payer: Self-pay | Admitting: Gastroenterology

## 2016-08-19 VITALS — BP 119/74 | HR 79 | Temp 97.1°F | Resp 21 | Ht 64.0 in | Wt 186.0 lb

## 2016-08-19 DIAGNOSIS — D127 Benign neoplasm of rectosigmoid junction: Secondary | ICD-10-CM

## 2016-08-19 DIAGNOSIS — Z86010 Personal history of colonic polyps: Secondary | ICD-10-CM

## 2016-08-19 DIAGNOSIS — Z8509 Personal history of malignant neoplasm of other digestive organs: Secondary | ICD-10-CM

## 2016-08-19 HISTORY — PX: COLONOSCOPY: SHX174

## 2016-08-19 MED ORDER — SODIUM CHLORIDE 0.9 % IV SOLN: 500.0000 mL | INTRAVENOUS | Status: DC

## 2016-08-19 NOTE — Progress Notes (Signed)
  [facility] Post-op Note A/ox3, pleased with MAC, report to [facility]

## 2016-08-19 NOTE — Op Note (Signed)
[facility] Patient Name: Margaret Green Procedure Date: 08/19/2016 1:25 PM MRN: [id number] Endoscopist: Remo Lipps P. Brode Sculley MD, MD Age: 55 Referring MD:  Date of Birth: 1960-09-13 Gender: Female Account #: [id number] Procedure:                Colonoscopy Indications:              High risk colon cancer surveillance: Personal                            history of rectosigmoid cancer (malignant polyp                            removed one year ago) Medicines:                Monitored Anesthesia Care Procedure:                Pre-Anesthesia Assessment:                           - Prior to the procedure, a History and Physical                            was performed, and patient medications and                            allergies were reviewed. The patient's tolerance of                            previous anesthesia was also reviewed. The risks                            and benefits of the procedure and the sedation                            options and risks were discussed with the patient.                            All questions were answered, and informed consent                            was obtained. Prior Anticoagulants: The patient has                            taken no previous anticoagulant or antiplatelet                            agents. ASA Grade Assessment: II - A patient with                            mild systemic disease. After reviewing the risks                            and benefits, the patient was deemed in  satisfactory condition to undergo the procedure.                           After obtaining informed consent, the colonoscope                            was passed under direct vision. Throughout the                            procedure, the patient's blood pressure, pulse, and                            oxygen saturations were monitored continuously. The                            [id number]) was  introduced through the anus                            and advanced to the the cecum, identified by                            appendiceal orifice and ileocecal valve. The                            colonoscopy was performed without difficulty. The                            patient tolerated the procedure well. The quality                            of the bowel preparation was good. The ileocecal                            valve, appendiceal orifice, and rectum were                            photographed. Scope In: 1:28:43 PM Scope Out: 1:53:04 PM Scope Withdrawal Time: 0 hours 21 minutes 10 seconds  Total Procedure Duration: 0 hours 24 minutes 21 seconds  Findings:                 The perianal and digital rectal examinations were                            normal.                           A 4 mm benign appearing polyp was found in the                            recto-sigmoid colon. The polyp was sessile and                            appeared to be at the area of prior scar tissue  from initial polypectomy, could be reactive due to                            previous clip placement. The polyp was removed with                            a cold snare and then multiple biopsies taken to                            obtain deeper level of tissue at the site of the                            scar. Resection and retrieval were complete.                           A few medium-mouthed diverticula were found in the                            recto-sigmoid colon and sigmoid colon.                           The exam was otherwise without abnormality on                            direct and retroflexion views. Sigmoid area took                            some time to traverse due to restricted mobility in                            this area. Complications:            No immediate complications. Estimated blood loss:                            Minimal. Estimated Blood  Loss:     Estimated blood loss was minimal. Impression:               - One 4 mm polyp at the recto-sigmoid colon,                            removed with a cold snare and then further biopsies                            obtained. Resected and retrieved.                           - Diverticulosis in the recto-sigmoid colon and in                            the sigmoid colon.                           - The examination was otherwise normal on direct  and retroflexion views. Recommendation:           - Patient has a contact number available for                            emergencies. The signs and symptoms of potential                            delayed complications were discussed with the                            patient. Return to normal activities tomorrow.                            Written discharge instructions were provided to the                            patient.                           - Resume previous diet.                           - Continue present medications.                           - Await pathology results.                           - Repeat colonoscopy is recommended for                            surveillance. The colonoscopy date will be                            determined after pathology results from today's                            exam become available for review. Remo Lipps P. Denton Derks MD, MD 08/19/2016 1:59:50 PM This report has been signed electronically.

## 2016-08-19 NOTE — Progress Notes (Signed)
No problems noted in the recovery room. maw 

## 2016-08-19 NOTE — Patient Instructions (Signed)
YOU HAD AN ENDOSCOPIC PROCEDURE TODAY AT [facility]:   Refer to the procedure report that was given to you for any specific questions about what was found during the examination.  If the procedure report does not answer your questions, please call your gastroenterologist to clarify.  If you requested that your care partner not be given the details of your procedure findings, then the procedure report has been included in a sealed envelope for you to review at your convenience later.  YOU SHOULD EXPECT: Some feelings of bloating in the abdomen. Passage of more gas than usual.  Walking can help get rid of the air that was put into your GI tract during the procedure and reduce the bloating. If you had a lower endoscopy (such as a colonoscopy or flexible sigmoidoscopy) you may notice spotting of blood in your stool or on the toilet paper. If you underwent a bowel prep for your procedure, you may not have a normal bowel movement for a few days.  Please Note:  You might notice some irritation and congestion in your nose or some drainage.  This is from the oxygen used during your procedure.  There is no need for concern and it should clear up in a day or so.  SYMPTOMS TO REPORT IMMEDIATELY:   Following lower endoscopy (colonoscopy or flexible sigmoidoscopy):  Excessive amounts of blood in the stool  Significant tenderness or worsening of abdominal pains  Swelling of the abdomen that is new, acute  Fever of 100F or higher   Following upper endoscopy (EGD)  Vomiting of blood or coffee ground material  New chest pain or pain under the shoulder blades  Painful or persistently difficult swallowing  New shortness of breath  Fever of 100F or higher  Black, tarry-looking stools  For urgent or emergent issues, a gastroenterologist can be reached at any hour by calling [phone].   DIET:  We do recommend a small meal at first, but then you may proceed to your regular diet.  Drink  plenty of fluids but you should avoid alcoholic beverages for 24 hours.  ACTIVITY:  You should plan to take it easy for the rest of today and you should NOT DRIVE or use heavy machinery until tomorrow (because of the sedation medicines used during the test).    FOLLOW UP: Our staff will call the number listed on your records the next business day following your procedure to check on you and address any questions or concerns that you may have regarding the information given to you following your procedure. If we do not reach you, we will leave a message.  However, if you are feeling well and you are not experiencing any problems, there is no need to return our call.  We will assume that you have returned to your regular daily activities without incident.  If any biopsies were taken you will be contacted by phone or by letter within the next 1-3 weeks.  Please call us at [phone] if you have not heard about the biopsies in 3 weeks.    SIGNATURES/CONFIDENTIALITY: You and/or your care partner have signed paperwork which will be entered into your electronic medical record.  These signatures attest to the fact that that the information above on your After Visit Summary has been reviewed and is understood.  Full responsibility of the confidentiality of this discharge information lies with you and/or your care-partner.    Handouts were given to your care partner on polyps  diverticulosis. You may resume your current medications today. Await biopsy results. Please call if any questions or concerns.   

## 2016-08-19 NOTE — Progress Notes (Signed)
Called to room to assist during endoscopic procedure.  Patient ID and intended procedure confirmed with present staff. Received instructions for my participation in the procedure from the performing physician.  

## 2016-08-22 ENCOUNTER — Telehealth: Payer: Self-pay | Admitting: *Deleted

## 2016-08-22 ENCOUNTER — Other Ambulatory Visit: Payer: Self-pay

## 2016-08-22 MED ORDER — ROSUVASTATIN CALCIUM 20 MG PO TABS: 20.0000 mg | ORAL_TABLET | Freq: Every day | ORAL | 1 refills | Status: DC

## 2016-08-22 MED ORDER — ROSUVASTATIN CALCIUM 20 MG PO TABS
20.0000 mg | ORAL_TABLET | Freq: Every day | ORAL | 1 refills | Status: DC
Start: 2016-08-22 — End: 2017-06-08

## 2016-08-22 NOTE — Telephone Encounter (Signed)
  Follow up Call-  Call back number 08/19/2016 08/18/2015 06/15/2015  Post procedure Call Back phone  # [phone]  Permission to leave phone message Yes Yes Yes  Some recent data might be hidden     Patient questions:  Mailbox is full. Unable to leave message.

## 2016-08-22 NOTE — Telephone Encounter (Signed)
  Follow up Call-     Follow up Call-  Call back number 08/19/2016 08/18/2015 06/15/2015  Post procedure Call Back phone  # [phone]  Permission to leave phone message Yes Yes Yes  Some recent data might be hidden

## 2016-08-23 ENCOUNTER — Telehealth: Payer: Self-pay

## 2016-08-23 NOTE — Telephone Encounter (Signed)
  Follow up Call-  Call back number 08/19/2016 08/18/2015 06/15/2015  Post procedure Call Back phone  # [phone]  Permission to leave phone message Yes Yes Yes  Some recent data might be hidden    Patient was called for follow up after her procedure on 08/19/2016. No answer at the number given for follow up phone call. A message was left on the answering machine.

## 2016-08-24 ENCOUNTER — Telehealth: Payer: Self-pay

## 2016-08-24 NOTE — Telephone Encounter (Signed)
  Follow up Call-  Call back number 08/19/2016 08/18/2015 06/15/2015  Post procedure Call Back phone  # [phone]  Permission to leave phone message Yes Yes Yes  Some recent data might be hidden      Patient was called for follow up after her procedure on 08/19/2016. Patient reports that she has returned to her normal daily activities without any complications. Margaret Green was given a prescription for Aciphex 40 mg for a trial for two weeks to see if the medication was helping. Margaret Green reports that Aciphex was helping and she would like a new prescription for Aciphex 40 mg with refills. Please advise. Thanks!   Riki Sheer, LPN

## 2016-08-24 NOTE — Telephone Encounter (Signed)
  Follow up Call-  Call back number 08/19/2016 08/18/2015 06/15/2015  Post procedure Call Back phone  # [phone]  Permission to leave phone message Yes Yes Yes  Some recent data might be hidden     Patient questions:  Do you have a fever, pain , or abdominal swelling? No. Pain Score  0 *  Have you tolerated food without any problems? Yes.    Have you been able to return to your normal activities? Yes.    Do you have any questions about your discharge instructions: Diet   No. Medications  No. Follow up visit  No.  Do you have questions or concerns about your Care? No.  Actions: * If pain score is 4 or above: No action needed, pain <4.

## 2016-08-25 ENCOUNTER — Other Ambulatory Visit: Payer: Self-pay

## 2016-08-25 MED ORDER — RABEPRAZOLE SODIUM 20 MG PO TBEC: DELAYED_RELEASE_TABLET | ORAL | 3 refills | Status: DC

## 2016-08-25 NOTE — Telephone Encounter (Signed)
  I will order Aciphex 40 mg once daily, 90 pills with 3 refills. Patient will be notified. Thank you!  Izora Gala

## 2016-08-25 NOTE — Telephone Encounter (Signed)
Yes we can refill it for her. Can give 90 pills with 3 refills. She can see me in follow up in clinic to discuss long term management plan. Thanks

## 2016-08-26 ENCOUNTER — Other Ambulatory Visit: Payer: Self-pay | Admitting: Gastroenterology

## 2016-08-26 NOTE — Telephone Encounter (Signed)
Yes but please change the script to #90, RF#3 (currently listed as #30 RF 0)

## 2016-08-26 NOTE — Telephone Encounter (Signed)
Is Aciphex ok to refill. Pt has not been seen in 1 year. Last refill was in October this year. She did have colonoscopy 08/2016. I spoke with pt and she is happy with Aciphex and has requested a 37m supply sent to [facility] on [address].

## 2016-08-29 ENCOUNTER — Encounter: Payer: Self-pay | Admitting: Gastroenterology

## 2016-12-01 ENCOUNTER — Other Ambulatory Visit: Payer: Self-pay | Admitting: Family Medicine

## 2016-12-23 ENCOUNTER — Other Ambulatory Visit: Payer: Self-pay | Admitting: Family Medicine

## 2016-12-26 ENCOUNTER — Other Ambulatory Visit: Payer: Self-pay | Admitting: Family Medicine

## 2016-12-30 ENCOUNTER — Encounter (HOSPITAL_COMMUNITY): Payer: Self-pay

## 2016-12-30 ENCOUNTER — Emergency Department (HOSPITAL_COMMUNITY): Payer: BLUE CROSS/BLUE SHIELD

## 2016-12-30 ENCOUNTER — Emergency Department (HOSPITAL_COMMUNITY)
Admission: EM | Admit: 2016-12-30 | Discharge: 2016-12-30 | Disposition: A | Discharge status: Home / Self Care | Payer: BLUE CROSS/BLUE SHIELD | Attending: Emergency Medicine | Admitting: Emergency Medicine

## 2016-12-30 DIAGNOSIS — Z79899 Other long term (current) drug therapy: Secondary | ICD-10-CM | POA: Insufficient documentation

## 2016-12-30 DIAGNOSIS — R071 Chest pain on breathing: Secondary | ICD-10-CM | POA: Diagnosis not present

## 2016-12-30 DIAGNOSIS — E039 Hypothyroidism, unspecified: Secondary | ICD-10-CM | POA: Insufficient documentation

## 2016-12-30 DIAGNOSIS — R079 Chest pain, unspecified: Secondary | ICD-10-CM

## 2016-12-30 DIAGNOSIS — I10 Essential (primary) hypertension: Secondary | ICD-10-CM | POA: Insufficient documentation

## 2016-12-30 LAB — D-DIMER, QUANTITATIVE: D-Dimer, Quant: 0.4 ug/mL-FEU (ref 0.00–0.50)

## 2016-12-30 LAB — CBC WITH DIFFERENTIAL/PLATELET
Basophils Absolute: 0 10*3/uL (ref 0.0–0.1)
Basophils Relative: 0 %
EOS PCT: 0 %
Eosinophils Absolute: 0 10*3/uL (ref 0.0–0.7)
HEMATOCRIT: 44.5 % (ref 36.0–46.0)
Hemoglobin: 15.3 g/dL — ABNORMAL HIGH (ref 12.0–15.0)
LYMPHS ABS: 2.6 10*3/uL (ref 0.7–4.0)
LYMPHS PCT: 35 %
MCH: 31.9 pg (ref 26.0–34.0)
MCHC: 34.4 g/dL (ref 30.0–36.0)
MCV: 92.7 fL (ref 78.0–100.0)
MONO ABS: 0.5 10*3/uL (ref 0.1–1.0)
MONOS PCT: 7 %
NEUTROS ABS: 4.2 10*3/uL (ref 1.7–7.7)
Neutrophils Relative %: 58 %
PLATELETS: 331 10*3/uL (ref 150–400)
RBC: 4.8 MIL/uL (ref 3.87–5.11)
RDW: 13.7 % (ref 11.5–15.5)
WBC: 7.3 10*3/uL (ref 4.0–10.5)

## 2016-12-30 LAB — COMPREHENSIVE METABOLIC PANEL
ALK PHOS: 77 U/L (ref 38–126)
ALT: 18 U/L (ref 14–54)
ANION GAP: 8 (ref 5–15)
AST: 19 U/L (ref 15–41)
Albumin: 4.6 g/dL (ref 3.5–5.0)
BILIRUBIN TOTAL: 0.5 mg/dL (ref 0.3–1.2)
BUN: 11 mg/dL (ref 6–20)
CALCIUM: 9.4 mg/dL (ref 8.9–10.3)
CO2: 24 mmol/L (ref 22–32)
CREATININE: 0.84 mg/dL (ref 0.44–1.00)
Chloride: 104 mmol/L (ref 101–111)
Glucose, Bld: 103 mg/dL — ABNORMAL HIGH (ref 65–99)
Potassium: 3.6 mmol/L (ref 3.5–5.1)
Sodium: 136 mmol/L (ref 135–145)
TOTAL PROTEIN: 8.8 g/dL — AB (ref 6.5–8.1)

## 2016-12-30 LAB — I-STAT TROPONIN, ED
TROPONIN I, POC: 0 ng/mL (ref 0.00–0.08)
Troponin i, poc: 0 ng/mL (ref 0.00–0.08)

## 2016-12-30 MED ORDER — IOPAMIDOL (ISOVUE-370) INJECTION 76%
INTRAVENOUS | Status: AC
  Filled 2016-12-30: qty 100

## 2016-12-30 NOTE — ED Notes (Signed)
Margaret Green CELL [phone]

## 2016-12-30 NOTE — ED Notes (Signed)
PT STATES SHE HAS HAD PALPITATIONS WITH ELEVATED HEART RATE 130-190'S FOR SEVERAL MONTHS. AS RECENT AS YESTERDAY WITH SEVERAL EPISODES A DAY. PT WAS SEEN AT DOCTOR'S OFFICE RECENT YET THIS INFORMATION WAS NOT GIVEN. PCP DOES NOT KNOW EITHER. PT STATES "HER APPLE WATCH" CONTAINS THE HX OF THE EVENTS.  EDPA AND EDP GIVEN THIS INFORMATION. PT IS ON CARDIAC MONITOR.

## 2016-12-30 NOTE — ED Triage Notes (Signed)
Pt with sharp chest pain under left breast starting 15 min ago while fixing her hair.  SOB.  No recent cough/congestion.  No increase in physical activity.  Hx GERD.  Has not eaten today.

## 2016-12-30 NOTE — Discharge Instructions (Signed)
It was my pleasure taking care of you today!  You had a very reassuring work up today, however I do believe you need to follow up with the cardiology clinic listed. You will likely need a Holter monitor to further evaluation your palpitations.  Return to ER for chest pain worse with exertion, difficulty breathing, new/worsening symptoms, any additional concerns.

## 2016-12-30 NOTE — ED Notes (Signed)
Bed: WA22 Expected date:  Expected time:  Means of arrival:  Comments: 

## 2016-12-30 NOTE — ED Provider Notes (Signed)
[facility] DEPT Provider Note   CSN: [id number] Arrival date & time: 12/30/16  0623     History   Chief Complaint Chief Complaint  Patient presents with  . Chest Pain    HPI Margaret Green is a 56 y.o. female.  The history is provided by the patient and medical records. No language interpreter was used.   Margaret Green is a 56 y.o. female  who presents to the Emergency Department complaining of Acute onset of left-sided chest pain underneath her left breast. Pain is described as a sharp sensation that lasted about 15-20 minutes and is now easing off. Pain becomes worse with deep breaths. No change with movement or palpation. Patient states that pain was so severe that it took her breath away, however she does also state she felt short of breath for about 10 minutes. This is now resolved. No history of similar. No medications taken prior to arrival for symptoms.   Patient additionally states that over the last three weeks she will experience palpitations. When she felt her heart race, she checked her heart rate on apple watch and HR in the 130's-150's. This has been occurring a few times a week. Two days ago, heart rate got at high as 199 and she took a photo of watch at that time which she showed me. Denies palpitations with episode today.   History of hypertension, hyperlipidemia. No history of heart disease or diabetes. + family history: father had MI in his late 61's and has had 4 stents placed.   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 She is also on estradiol. No long trips or recent procedures/immobilizations. No leg swelling.    Past Medical History:  Diagnosis Date  . 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 Allergy   . Anxiety   . Arthritis   . Endometriosis 08/19/2009   Hysterectomy.     . Endometriosis, site unspecified   . Family history of diabetes  mellitus   . GERD (gastroesophageal reflux disease)   . Headache(784.0)   . Heart murmur    past hx of heart murmur  . Hyperlipidemia   . Hypothyroidism   . Neutropenia   . Raynaud's syndrome   . Stricture and stenosis of esophagus     Patient Active Problem List   Diagnosis Date Noted  . Dry mouth 08/27/2015  . Depression 07/23/2015  . Adenocarcinoma, colon ([place]) 07/23/2015  . Hoarseness 03/10/2015  . Migraine 08/14/2014  . Allergic rhinitis 08/14/2014  . IBS (irritable bowel syndrome) 08/14/2014  . Essential hypertension, benign 08/14/2014  . GERD (gastroesophageal reflux disease) 08/14/2014  . Right thyroid nodule 09/24/2013  . GAD (generalized anxiety disorder) 09/29/2010  . Neutropenia ([place]) 07/23/2009  . ANEMIA, PERNICIOUS 02/24/2009  . SPRAIN/STRAIN, LUMBAR REGION 04/16/2007  . Hyperlipidemia 04/13/2007  . RAYNAUD'S DISEASE 04/13/2007  . Osteoarthritis 04/13/2007  . Fibromyalgia 04/13/2007  . Hypothyroidism 04/03/2007    Past Surgical History:  Procedure Laterality Date  . ABDOMINAL HYSTERECTOMY    . BREAST BIOPSY     benign  . BREAST REDUCTION SURGERY    . COLONOSCOPY    . HERNIA REPAIR    . POLYPECTOMY    . UPPER GASTROINTESTINAL ENDOSCOPY      OB History    No data available       Home Medications    Prior to Admission medications   Medication Sig Start Date  End Date Taking? Authorizing Provider  ALPRAZolam (XANAX) 0.25 MG tablet TAKE ONE TABLET BY MOUTH TWICE DAILY AS NEEDED Patient taking differently: Take 1 tablet by mouth twice daily as needed for anxiety 12/29/16  Yes Marin Olp, MD  butalbital-aspirin-caffeine [facility]) [phone] MG capsule take 1 capsule by mouth every 4 hours if needed Patient taking differently: Take 1 capsule by mouth every 4 (four) hours as needed for migraine.  04/29/16  Yes Marin Olp, MD  Calcium Carbonate-Vitamin D (CALCIUM 600+D) 600-400 MG-UNIT tablet Take 1 tablet by mouth daily.   Yes Historical  Provider, MD  cetirizine (ZYRTEC) 10 MG tablet Take 10 mg by mouth daily.     Yes Historical Provider, MD  cevimeline (EVOXAC) 30 MG capsule Take 30 mg by mouth 2 (two) times daily.    Yes Historical Provider, MD  citalopram (CELEXA) 20 MG tablet TAKE ONE TABLET BY MOUTH ONCE DAILY 12/23/16  Yes Marin Olp, MD  estradiol (VIVELLE-DOT) 0.05 MG/24HR patch Place 1 patch onto the skin 2 (two) times a week. Pt places on Wednesday and Sunday.   Yes Historical Provider, MD  hyoscyamine (LEVBID) 0.375 MG 12 hr tablet TAKE ONE TABLET BY MOUTH TWICE DAILY 12/01/16  Yes Marin Olp, MD  levothyroxine (SYNTHROID, LEVOTHROID) 75 MCG tablet TAKE ONE TABLET BY MOUTH ONCE DAILY 08/15/16  Yes Marin Olp, MD  losartan (COZAAR) 50 MG tablet TAKE ONE TABLET BY MOUTH ONCE DAILY **NEEDS  APPOINTMENT  FOR  MORE  REFILLS** 12/01/16  Yes Marin Olp, MD  RABEprazole (ACIPHEX) 20 MG tablet Take 2 tablets (40 mg total) by mouth daily. Patient taking differently: Take 40 mg by mouth daily before lunch.  08/26/16  Yes Manus Gunning, MD  rosuvastatin (CRESTOR) 20 MG tablet Take 1 tablet (20 mg total) by mouth daily. Patient taking differently: Take 20 mg by mouth at bedtime.  08/22/16  Yes Marin Olp, MD  SUMAtriptan (IMITREX) 100 MG tablet Take 100 mg by mouth every 2 (two) hours as needed for migraine.   Yes Historical Provider, MD  tiZANidine (ZANAFLEX) 4 MG tablet TAKE ONE TABLET BY MOUTH TWICE DAILY AS NEEDED Patient taking differently: Take 1 tablet by mouth twice daily as needed for muscle spasms 08/02/16  Yes Marin Olp, MD  zolpidem (AMBIEN) 5 MG tablet TAKE ONE TABLET BY MOUTH AT BEDTIME AS NEEDED FOR SLEEP 07/13/16  Yes Marin Olp, MD    Family History Family History  Problem Relation Age of Onset  . Coronary artery disease Father     age 47, all 3 siblings with early cardiac disease  . Colon polyps Father   . Cirrhosis Mother     alcohol related  . Ovarian cancer  Maternal Aunt   . Stomach cancer Paternal Aunt   . Esophageal cancer Paternal Aunt   . Diabetes Paternal Grandmother     and aunt  . Colon polyps Sister   . Colon cancer Neg Hx   . Rectal cancer Neg Hx     Social History Social History  Substance Use Topics  . Smoking status: Never Smoker  . Smokeless tobacco: Never Used  . Alcohol use 0.0 oz/week     Comment: occasionally     Allergies   Codeine; Omeprazole; Meloxicam; Oxycodone-aspirin; and Sulfamethoxazole   Review of Systems Review of Systems  Respiratory: Positive for shortness of breath. Negative for cough.   Cardiovascular: Positive for chest pain and palpitations. Negative for leg swelling.  All other systems reviewed and are negative.    Physical Exam Updated Vital Signs BP (!) 154/78   Pulse 65   Temp 97.4 F (36.3 C) (Oral)   Resp 19   Ht 5\' 4"  (1.626 m)   Wt 81.6 kg   SpO2 98%   BMI 30.90 kg/m   Physical Exam  Constitutional: She is oriented to person, place, and time. She appears well-developed and well-nourished. No distress.  HENT:  Head: Normocephalic and atraumatic.  Neck: Neck supple. No JVD present.  Cardiovascular: Normal rate, regular rhythm and normal heart sounds.   No murmur heard. Pulmonary/Chest: Effort normal and breath sounds normal. No respiratory distress. She has no wheezes. She has no rales.  Abdominal: Soft. She exhibits no distension. There is no tenderness.  Musculoskeletal: She exhibits no edema.  Neurological: She is alert and oriented to person, place, and time.  Skin: Skin is warm and dry.  Nursing note and vitals reviewed.    ED Treatments / Results  Labs (all labs ordered are listed, but only abnormal results are displayed) Labs Reviewed  CBC WITH DIFFERENTIAL/PLATELET - Abnormal; Notable for the following:       Result Value   Hemoglobin 15.3 (*)    All other components within normal limits  COMPREHENSIVE METABOLIC PANEL - Abnormal; Notable for the  following:    Glucose, Bld 103 (*)    Total Protein 8.8 (*)    All other components within normal limits  D-DIMER, QUANTITATIVE (NOT AT [facility])  I-STAT TROPOININ, ED  I-STAT TROPOININ, ED    EKG  EKG Interpretation  Date/Time:  Friday December 30 2016 07:37:13 EDT Ventricular Rate:  90 PR Interval:    QRS Duration: 91 QT Interval:  369 QTC Calculation: 452 R Axis:   15 Text Interpretation:  Sinus rhythm Low voltage, precordial leads No significant change was found Confirmed by CAMPOS  MD, KEVIN ([id number]) on 12/30/2016 8:48:26 AM       Radiology Dg Chest 2 View  Result Date: 12/30/2016 CLINICAL DATA:  New left-sided chest pain EXAM: CHEST  2 VIEW COMPARISON:  Chest CT 06/25/2015 FINDINGS: Normal heart size and mediastinal contours. Artifact from EKG leads. No acute infiltrate or edema. No effusion or pneumothorax. Cervical rib on the right. No acute osseous findings. IMPRESSION: No evidence of active disease. Electronically Signed   By: Monte Fantasia M.D.   On: 12/30/2016 08:33    Procedures Procedures (including critical care time)  Medications Ordered in ED Medications  iopamidol (ISOVUE-370) 76 % injection (not administered)     Initial Impression / Assessment and Plan / ED Course  I have reviewed the triage vital signs and the nursing notes.  Pertinent labs & imaging results that were available during my care of the patient were reviewed by me and considered in my medical decision making (see chart for details).    Margaret Green is a 56 y.o. female who presents to ED for sharp left-sided chest pain which began just prior to arrival. Worse with deep breaths. On exam, patient is afebrile, regular heart rate, clear lungs, no JVD, no edema/leg swelling. EKG non-ischemic. CXR negative. Labs reassuring with negative troponin x 2. D-dimer is negative. Low risk heart score of 3. Patient has been having palpitations although she has had no evidence of tachycardia during ER stay  on cardiac monitoring today. Will have her follow up with cardiology for possible holter monitoring. Reasons to return to ER discussed. Patient feels comfortable with plan as  dictated above. All questions answered.   Patient discussed with Dr. Venora Maples who agrees with treatment plan.   Final Clinical Impressions(s) / ED Diagnoses   Final diagnoses:  Chest pain    New Prescriptions Discharge Medication List as of 12/30/2016 12:18 PM       [facility], PA-C 12/30/16 [place], MD 12/30/16 [id number]

## 2017-01-01 ENCOUNTER — Other Ambulatory Visit: Payer: Self-pay | Admitting: Family Medicine

## 2017-01-02 ENCOUNTER — Ambulatory Visit (INDEPENDENT_AMBULATORY_CARE_PROVIDER_SITE_OTHER): Payer: BLUE CROSS/BLUE SHIELD | Admitting: Cardiology

## 2017-01-02 ENCOUNTER — Encounter: Payer: Self-pay | Admitting: Cardiology

## 2017-01-02 VITALS — BP 142/94 | HR 73 | Ht 64.0 in | Wt 188.2 lb

## 2017-01-02 DIAGNOSIS — M3500 Sicca syndrome, unspecified: Secondary | ICD-10-CM

## 2017-01-02 DIAGNOSIS — R0609 Other forms of dyspnea: Secondary | ICD-10-CM

## 2017-01-02 DIAGNOSIS — I10 Essential (primary) hypertension: Secondary | ICD-10-CM | POA: Diagnosis not present

## 2017-01-02 DIAGNOSIS — R079 Chest pain, unspecified: Secondary | ICD-10-CM | POA: Diagnosis not present

## 2017-01-02 DIAGNOSIS — R000 Tachycardia, unspecified: Secondary | ICD-10-CM | POA: Insufficient documentation

## 2017-01-02 DIAGNOSIS — R0600 Dyspnea, unspecified: Secondary | ICD-10-CM

## 2017-01-02 NOTE — Assessment & Plan Note (Signed)
10-15 minute episode of sharp left-sided chest pain that was worse with bending over and making it hard for her to stand up. I think is probably musculoskeletal in nature, she is not like this before. It was not made worse with exertion. He was worse with deep inspiration. This suggests that she may have potentially pulled muscle. I don't think we need to evaluate this with a stress test unless symptoms worsen or recur.

## 2017-01-02 NOTE — Assessment & Plan Note (Signed)
New onset exertional dyspnea that is sometimes associated with minimal exertion sometimes when she does a lot. With her history of Sjogren's and family history of CAD I am concerned about potential structural abnormality or diastolic dysfunction. I did hear either split S2 or a S4 gallop on exam. This would suggest the possibility of diastolic dysfunction.

## 2017-01-02 NOTE — Assessment & Plan Note (Signed)
Sjogren's associated with dyspnea. We need to ensure that she doesn't have any involvement of her heart. Plan: Check 2-D echo

## 2017-01-02 NOTE — Assessment & Plan Note (Signed)
Borderline elevated pressure today on losartan. She has been noticing spikes in her blood pressure. With her hepatic Heartbeats and her blood pressure, I think a good option would be to consider beta blocker versus calcium channel blocker for commissure rate control and pressure, however I need to wait and see what we see on her monitor and echocardiogram.

## 2017-01-02 NOTE — Progress Notes (Signed)
PCP: [name], MD  Clinic Note: Chief Complaint  Patient presents with  . Hospitalization Follow-up    Left-sided chest pain  . Shortness of Breath    pt states when walking or bending over   . Tachycardia    HPI: Margaret Green is a 56 y.o. female who is being seen today for the evaluation of Chest pain and rapid heartbeat at the request of Dr. Jola Schmidt [facility]) for her PCP [name], MD  . She herself is a history of hypertension and hyperlipidemia as well as family history of CAD with father having MI in his 19s and had 4 stents placed. She also has history of colon adenocarcinoma removed with polypectomy in 2016.  [name] was last seen on April 20 for chest pain in the emergency room. -- She noted to have acute onset left-sided chest pain underneath left breast describes a sharp sensation. It was worse with deep breath. It was severe enough to take her breath away. She also has noted palpitations for the last 3 weeks feeling her heart race.  Her Apple Watch indicated heart rates of 130-50. She said that the facets are she noted was 199 bpm. She showed a picture of this recording to the ER doctor.  Recent Hospitalizations: None besides her ER visit  Studies Reviewed: None  Interval History: Yeira presents a describing her chest pain from Friday in a similar manner. She says that she was feeling fine in her usual state of health, recovering from a bout of pneumonia about a month ago.  Friday, while at work, she bent over to pick something up and noticed a sharp pain/pressure underneath her left breast the ribs. It made her double over and took her breath away. She and her partner standing up shortly after. As the pain finally resolved after about 10-15 minutes she was able to stand up. It hurt to take a deep breath, but once resolved, she just felt tired and weak for a while. She didn't really notice shortness of breath associated with it was  just because it was so bad. She also has had several episodes (at least 2 or 3 a week) of her heart rate spiking 130-150 range but as high as 200. Sometimes she notes it, other times she sees it on her smart watch. When she does notice it she feels like her heart is beating very regularly and fast. She otherwise notes intermittent episodes of fluttering sensation in her chest is a different sensation. She is noted intermittently feeling short of breath with simple things such as standing up, getting out of bed or starting to walk. This is not associated with chest discomfort. She does note some dizziness. She recovered from her pneumonia about a month ago and has not really been having significant coughing or fever/chills. No PND, orthopnea or edema.  No chest pain or shortness of breath with rest or exertion. No syncope/near syncope, or TIA/amaurosis fugax symptoms. No melena, hematochezia, hematuria, or epstaxis. No claudication.  ROS: A comprehensive was performed. Review of Systems  Constitutional: Negative for malaise/fatigue.  HENT: Negative for nosebleeds.   Respiratory: Positive for shortness of breath. Negative for wheezing (some post-exhaling wheeae since PnA).        PNA in March - no furthercough  Cardiovascular: Positive for chest pain and palpitations (intermittent episodes of spiking HR (& BP)). Negative for claudication and leg swelling.  Gastrointestinal: Positive for abdominal pain. Negative for blood in stool,  melena, nausea and vomiting.       Lots of GI issues in general  Genitourinary: Negative for hematuria.  Musculoskeletal: Positive for joint pain.  Neurological: Positive for dizziness and headaches (? realted to high BP). Negative for seizures and loss of consciousness.  Psychiatric/Behavioral: Negative for memory loss. The patient is nervous/anxious (Chronic generalized anxiety.). The patient does not have insomnia.   All other systems reviewed and are  negative.   Past Medical History:  Diagnosis Date  . Adenocarcinoma, colon ([place]) 07/23/2015   Removed completely by polypectomy 06/2015   . Allergy   . Anxiety   . Arthritis   . Endometriosis 08/19/2009   Hysterectomy.     . Family history of diabetes mellitus   . GERD (gastroesophageal reflux disease)   . Headache(784.0)   . Heart murmur    past hx of heart murmur  . Hyperlipidemia   . Hypothyroidism   . Neutropenia   . Raynaud's syndrome   . Sjogrens syndrome ([place])   . Stricture and stenosis of esophagus   Sjohgren's  Past Surgical History:  Procedure Laterality Date  . ABDOMINAL HYSTERECTOMY    . BREAST BIOPSY     benign  . BREAST REDUCTION SURGERY    . COLONOSCOPY    . HERNIA REPAIR    . POLYPECTOMY    . UPPER GASTROINTESTINAL ENDOSCOPY      Current Meds  Medication Sig  . ALPRAZolam (XANAX) 0.25 MG tablet TAKE ONE TABLET BY MOUTH TWICE DAILY AS NEEDED (Patient taking differently: Take 1 tablet by mouth twice daily as needed for anxiety)  . butalbital-aspirin-caffeine (FIORINAL) 50-325-40 MG capsule take 1 capsule by mouth every 4 hours if needed (Patient taking differently: Take 1 capsule by mouth every 4 (four) hours as needed for migraine. )  . Calcium Carbonate-Vitamin D (CALCIUM 600+D) 600-400 MG-UNIT tablet Take 1 tablet by mouth daily.  . cetirizine (ZYRTEC) 10 MG tablet Take 10 mg by mouth daily.    . cevimeline (EVOXAC) 30 MG capsule Take 30 mg by mouth 2 (two) times daily.   . citalopram (CELEXA) 20 MG tablet TAKE ONE TABLET BY MOUTH ONCE DAILY  . estradiol (VIVELLE-DOT) 0.05 MG/24HR patch Place 1 patch onto the skin 2 (two) times a week. Pt places on Wednesday and Sunday.  . hyoscyamine (LEVBID) 0.375 MG 12 hr tablet TAKE ONE TABLET BY MOUTH TWICE DAILY  . levothyroxine (SYNTHROID, LEVOTHROID) 75 MCG tablet TAKE ONE TABLET BY MOUTH ONCE DAILY  . losartan (COZAAR) 50 MG tablet TAKE ONE TABLET BY MOUTH ONCE DAILY **NEEDS  APPOINTMENT  FOR  MORE  REFILLS**   . RABEprazole (ACIPHEX) 20 MG tablet Take 2 tablets (40 mg total) by mouth daily. (Patient taking differently: Take 40 mg by mouth daily before lunch. )  . rosuvastatin (CRESTOR) 20 MG tablet Take 1 tablet (20 mg total) by mouth daily. (Patient taking differently: Take 20 mg by mouth at bedtime. )  . SUMAtriptan (IMITREX) 100 MG tablet Take 100 mg by mouth every 2 (two) hours as needed for migraine.  Marland Kitchen tiZANidine (ZANAFLEX) 4 MG tablet TAKE ONE TABLET BY MOUTH TWICE DAILY AS NEEDED (Patient taking differently: Take 1 tablet by mouth twice daily as needed for muscle spasms)  . zolpidem (AMBIEN) 5 MG tablet TAKE ONE TABLET BY MOUTH AT BEDTIME AS NEEDED FOR SLEEP   Current Facility-Administered Medications for the 01/02/17 encounter (Office Visit) with Leonie Man, MD  Medication  . 0.9 %  sodium chloride infusion  Allergies  Allergen Reactions  . Codeine Nausea And Vomiting  . Omeprazole Hives  . Meloxicam Itching and Rash  . Oxycodone-Aspirin Itching and Rash  . Sulfamethoxazole Itching and Rash    Social History   Social History  . Marital status: Single    Spouse name: N/A  . Number of children: N/A  . Years of education: N/A   Social History Main Topics  . Smoking status: Never Smoker  . Smokeless tobacco: Never Used  . Alcohol use 0.0 oz/week     Comment: occasionally  . Drug use: No  . Sexual activity: Yes   Other Topics Concern  . None   Social History Narrative   Gay. Partner for 2 years in 2013. Prior 20 year relationship-still friends. No children. 1 dog-peekapoo.       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       Hobbies: golf, cooking, gardening, poker   Family History family history includes Cirrhosis in her mother; Colon polyps in her father and sister; Coronary artery disease in her father; Diabetes in her paternal grandmother; Esophageal cancer in her paternal aunt;  Heart attack (age of onset: 63) in her father; Ovarian cancer in her maternal aunt; Stomach cancer in her paternal aunt.  She also notes 3 siblings have a history of premature cardiac disease   Wt Readings from Last 3 Encounters:  01/02/17 188 lb 3.2 oz (85.4 kg)  12/30/16 180 lb (81.6 kg)  08/19/16 186 lb (84.4 kg)    PHYSICAL EXAM BP (!) 142/94   Pulse 73   Ht 5\' 4"  (1.626 m)   Wt 188 lb 3.2 oz (85.4 kg)   SpO2 96%   BMI 32.30 kg/m  General appearance: alert, cooperative, appears stated age, no distress and Mildly obese. She is otherwise well-nourished well-groomed. HEENT: [place]/AT, EOMI, MMM, anicteric sclera Neck: no adenopathy, no carotid bruit and no JVD; no thyromegaly Lungs: clear to auscultation bilaterally, normal percussion bilaterally and non-labored Heart: regular rate and rhythm, S1 & S2 normal with either split S2 or S4 gallop. No obvious murmur (she had previously been diagnosed with a murmur). No rubs or gallops. Abdomen: soft, non-tender; bowel sounds normal; no masses,  no organomegaly; no HJR Extremities: extremities normal, atraumatic, no cyanosis, or edema  Pulses: 2+ and symmetric; \ Skin: mobility and turgor normal, no edema, no evidence of bleeding or bruising, no lesions noted, temperature normal and texture normal  Neurologic: Mental status: Alert& oriented, thought content appropriate; pleasant mood and affect but anxious;  Cranial nerves: normal (II-XII grossly intact)    Adult ECG Report -- not checked today From ER 12/30/16  Rate: 90 ;  Rhythm: normal sinus rhythm and Normal axis, intervals and durations. Normal ST and T-wave segments.;   Narrative Interpretation: Essentially normal EKG   Other studies Reviewed: Additional studies/ records that were reviewed today include:  Recent Labs:    Lab Results  Component Value Date   CREATININE 0.84 12/30/2016   BUN 11 12/30/2016   NA 136 12/30/2016   K 3.6 12/30/2016   CL 104 12/30/2016   CO2 24  12/30/2016       ASSESSMENT / PLAN: Problem List Items Addressed This Visit    Chest pain with low risk for cardiac etiology - Primary    10-15 minute episode of sharp left-sided chest pain that was worse with bending over and making it hard for her to stand up.  I think is probably musculoskeletal in nature, she is not like this before. It was not made worse with exertion. He was worse with deep inspiration. This suggests that she may have potentially pulled muscle. I don't think we need to evaluate this with a stress test unless symptoms worsen or recur.      Relevant Orders   ECHOCARDIOGRAM COMPLETE   Dyspnea on exertion    New onset exertional dyspnea that is sometimes associated with minimal exertion sometimes when she does a lot. With her history of Sjogren's and family history of CAD I am concerned about potential structural abnormality or diastolic dysfunction. I did hear either split S2 or a S4 gallop on exam. This would suggest the possibility of diastolic dysfunction.      Relevant Orders   ECHOCARDIOGRAM COMPLETE   Cardiac event monitor   Essential hypertension, benign (Chronic)    Borderline elevated pressure today on losartan. She has been noticing spikes in her blood pressure. With her hepatic Heartbeats and her blood pressure, I think a good option would be to consider beta blocker versus calcium channel blocker for commissure rate control and pressure, however I need to wait and see what we see on her monitor and echocardiogram.      Sjogren's disease ([place]) (Chronic)    Sjogren's associated with dyspnea. We need to ensure that she doesn't have any involvement of her heart. Plan: Check 2-D echo      Relevant Orders   ECHOCARDIOGRAM COMPLETE   Tachycardia with hypertension    Intermittent rapid heart beat spells and occasionally having some fluttering. She is also noted spikes in her blood pressure. Thankfully, not associated with any syncope or near syncope. Plan for  now will be to evaluate with a 2 week cardiac event monitor.      Relevant Orders   ECHOCARDIOGRAM COMPLETE   Cardiac event monitor      Current medicines are reviewed at length with the patient today. (+/- concerns) n/a The following changes have been made: n/a  Patient Instructions  Schedule both at [place] has requested that you have an echocardiogram. Echocardiography is a painless test that uses sound waves to create images of your heart. It provides your doctor with information about the size and shape of your heart and how well your heart's chambers and valves are working. This procedure takes approximately one hour. There are no restrictions for this procedure.   Your physician has recommended that you wear an event monitor 2 weeks. Event monitors are medical devices that record the heart's electrical activity. Doctors most often [place] these monitors to diagnose arrhythmias. Arrhythmias are problems with the speed or rhythm of the heartbeat. The monitor is a small, portable device. You can wear one while you do your normal daily activities. This is usually used to diagnose what is causing palpitations/syncope (passing out).   Your physician recommends that you schedule a follow-up appointment in 1 month with DR HARDING.    Studies Ordered:   Orders Placed This Encounter  Procedures  . Cardiac event monitor  . ECHOCARDIOGRAM COMPLETE      Glenetta Hew, M.D., M.S. Interventional Cardiologist   Pager # [phone] Phone # [phone] [address] [place],  [address]

## 2017-01-02 NOTE — Patient Instructions (Signed)
Schedule both at [place] has requested that you have an echocardiogram. Echocardiography is a painless test that uses sound waves to create images of your heart. It provides your doctor with information about the size and shape of your heart and how well your heart's chambers and valves are working. This procedure takes approximately one hour. There are no restrictions for this procedure.   Your physician has recommended that you wear an event monitor 2 weeks. Event monitors are medical devices that record the heart's electrical activity. Doctors most often [place] these monitors to diagnose arrhythmias. Arrhythmias are problems with the speed or rhythm of the heartbeat. The monitor is a small, portable device. You can wear one while you do your normal daily activities. This is usually used to diagnose what is causing palpitations/syncope (passing out).   Your physician recommends that you schedule a follow-up appointment in 1 month with DR HARDING.

## 2017-01-02 NOTE — Assessment & Plan Note (Signed)
Intermittent rapid heart beat spells and occasionally having some fluttering. She is also noted spikes in her blood pressure. Thankfully, not associated with any syncope or near syncope. Plan for now will be to evaluate with a 2 week cardiac event monitor.

## 2017-01-12 ENCOUNTER — Encounter: Payer: Self-pay | Admitting: Family Medicine

## 2017-01-12 ENCOUNTER — Ambulatory Visit (INDEPENDENT_AMBULATORY_CARE_PROVIDER_SITE_OTHER): Payer: BLUE CROSS/BLUE SHIELD | Admitting: Family Medicine

## 2017-01-12 VITALS — BP 138/90 | HR 74 | Temp 98.6°F | Ht 64.0 in | Wt 189.0 lb

## 2017-01-12 DIAGNOSIS — M3500 Sicca syndrome, unspecified: Secondary | ICD-10-CM | POA: Diagnosis not present

## 2017-01-12 DIAGNOSIS — N649 Disorder of breast, unspecified: Secondary | ICD-10-CM

## 2017-01-12 DIAGNOSIS — R0789 Other chest pain: Secondary | ICD-10-CM | POA: Diagnosis not present

## 2017-01-12 DIAGNOSIS — I10 Essential (primary) hypertension: Secondary | ICD-10-CM | POA: Diagnosis not present

## 2017-01-12 DIAGNOSIS — R002 Palpitations: Secondary | ICD-10-CM

## 2017-01-12 NOTE — Progress Notes (Signed)
Pre visit review using our clinic review tool, if applicable. No additional management support is needed unless otherwise documented below in the visit note. 

## 2017-01-12 NOTE — Assessment & Plan Note (Addendum)
From Dr. Darcus Pester note 01/02/17. Adding this due to merging problem dry mouth and sjogrens "Sjogren's associated with dyspnea. We need to ensure that she doesn't have any involvement of her heart. Plan: Check 2-D echo"

## 2017-01-12 NOTE — Assessment & Plan Note (Signed)
S: controlled poorly on losartan alone.  BP Readings from Last 3 Encounters:  01/12/17 138/90  01/02/17 (!) 142/94  12/30/16 (!) 154/78  A/P:Continue current meds for now. Dr. Ellyn Hack mentioned CCB or beta blocker potential but wanted to wait until testing was completed

## 2017-01-12 NOTE — Progress Notes (Signed)
Subjective:  Margaret Green is a 56 y.o. year old very pleasant female patient who presents for/with See problem oriented charting ROS- in the last week no chest pain. Doesn't feel like she can take a good deep breath and feels slightly winded. No lightheadedness. No edema or calf pain   Past Medical History-  Patient Active Problem List   Diagnosis Date Noted  . Sjogren's disease ([place]) 08/27/2015    Priority: High  . Adenocarcinoma, colon ([place]) 07/23/2015    Priority: High  . Depression 07/23/2015    Priority: Medium  . Migraine 08/14/2014    Priority: Medium  . IBS (irritable bowel syndrome) 08/14/2014    Priority: Medium  . Essential hypertension, benign 08/14/2014    Priority: Medium  . GERD (gastroesophageal reflux disease) 08/14/2014    Priority: Medium  . Right thyroid nodule 09/24/2013    Priority: Medium  . GAD (generalized anxiety disorder) 09/29/2010    Priority: Medium  . Hyperlipidemia 04/13/2007    Priority: Medium  . Fibromyalgia 04/13/2007    Priority: Medium  . Hypothyroidism 04/03/2007    Priority: Medium  . Allergic rhinitis 08/14/2014    Priority: Low  . Neutropenia ([place]) 07/23/2009    Priority: Low  . ANEMIA, PERNICIOUS 02/24/2009    Priority: Low  . SPRAIN/STRAIN, LUMBAR REGION 04/16/2007    Priority: Low  . RAYNAUD'S DISEASE 04/13/2007    Priority: Low  . Osteoarthritis 04/13/2007    Priority: Low  . Chest pain with low risk for cardiac etiology 01/02/2017  . Dyspnea on exertion 01/02/2017  . Tachycardia with hypertension 01/02/2017  . Hoarseness 03/10/2015    Medications- reviewed and updated Current Outpatient Prescriptions  Medication Sig Dispense Refill  . ALPRAZolam (XANAX) 0.25 MG tablet TAKE ONE TABLET BY MOUTH TWICE DAILY AS NEEDED (Patient taking differently: Take 1 tablet by mouth twice daily as needed for anxiety) 30 tablet 1  . butalbital-aspirin-caffeine (FIORINAL) 50-325-40 MG capsule take 1 capsule by mouth every 4 hours  if needed (Patient taking differently: Take 1 capsule by mouth every 4 (four) hours as needed for migraine. ) 30 capsule 5  . Calcium Carbonate-Vitamin D (CALCIUM 600+D) 600-400 MG-UNIT tablet Take 1 tablet by mouth daily.    . cetirizine (ZYRTEC) 10 MG tablet Take 10 mg by mouth daily.      . cevimeline (EVOXAC) 30 MG capsule Take 30 mg by mouth 2 (two) times daily.     . citalopram (CELEXA) 20 MG tablet TAKE ONE TABLET BY MOUTH ONCE DAILY 90 tablet 1  . estradiol (VIVELLE-DOT) 0.05 MG/24HR patch Place 1 patch onto the skin 2 (two) times a week. Pt places on Wednesday and Sunday.    . hyoscyamine (LEVBID) 0.375 MG 12 hr tablet TAKE ONE TABLET BY MOUTH TWICE DAILY 60 tablet 0  . levothyroxine (SYNTHROID, LEVOTHROID) 75 MCG tablet TAKE ONE TABLET BY MOUTH ONCE DAILY 90 tablet 3  . losartan (COZAAR) 50 MG tablet TAKE 1 TABLET BY MOUTH ONCE DAILY** NEEDS APPOINTMENT FOR MORE REFILLS** 30 tablet 0  . methotrexate (RHEUMATREX) 2.5 MG tablet Take 2.5 mg by mouth once a week. Caution:Chemotherapy. Protect from light.    . RABEprazole (ACIPHEX) 20 MG tablet Take 2 tablets (40 mg total) by mouth daily. (Patient taking differently: Take 40 mg by mouth daily before lunch. ) 120 tablet 3  . rosuvastatin (CRESTOR) 20 MG tablet Take 1 tablet (20 mg total) by mouth daily. (Patient taking differently: Take 20 mg by mouth at bedtime. ) 90  tablet 1  . SUMAtriptan (IMITREX) 100 MG tablet Take 100 mg by mouth every 2 (two) hours as needed for migraine.    Marland Kitchen tiZANidine (ZANAFLEX) 4 MG tablet TAKE ONE TABLET BY MOUTH TWICE DAILY AS NEEDED (Patient taking differently: Take 1 tablet by mouth twice daily as needed for muscle spasms) 180 tablet 1  . zolpidem (AMBIEN) 5 MG tablet TAKE ONE TABLET BY MOUTH AT BEDTIME AS NEEDED FOR SLEEP 30 tablet 3   No current facility-administered medications for this visit.     Objective: BP 138/90 (BP Location: Left Arm, Patient Position: Sitting, Cuff Size: Large)   Pulse 74   Temp  98.6 F (37 C) (Oral)   Ht 5\' 4"  (1.626 m)   Wt 189 lb (85.7 kg)   SpO2 96%   BMI 32.44 kg/m  Gen: NAD, resting comfortably CV: RRR no murmurs rubs or gallops No chest wall pain Lungs: CTAB no crackles, wheeze, rhonchi Abdomen: soft/nontender/nondistended/normal bowel sounds. No rebound or guarding.  Ext: no edema Skin: warm, dry At least 8 cm below the right nipple (chaperone Jamie in room at time of exam) there is a 2 x 1 cm scab with only mild surrounding erythema.   Assessment/Plan:  Other chest pain Palpitations  S: Patient was seen in the ER about 2 weeks ago with left sided chest pain unde left breast. Pain lasted 15-20 minutes and was sharp and was easing off by time of visit. Also felt winded somewhat as well. Pain was worse with deep breaths and she did not feel that she could take a good deep breath.   She also had 3 weeks of palpitations. Also had high heart rate with HR on her watch from 130s to 190s but did not always have palpitations with those episodes. Still notes at least over 150 at least once a day even at rest.   She was very concerned given family history of CAD with dad with MI in late 37s and 4 setents placed to this point in his life.   In ER, chest x-ray was reassuring. D dimer was not elevated. troponins not elevated.   She has seen cardiology who did not recommend stress testing at thsi point but did recommend 2-d echocardiogram and holter monitor which she is getting set up to do. She luckily has not had any more chest pain since that visit- was having some positional pain taht day.  A/P: she will follow through with testing as above.   She also  wants to do [facility] CAD test through her work- it will be covered through work. Our lab has to draw and she will send it. We helped her draw this bloodwork today. Apparently this test will give a ranking of CAD risk- and if positive she would likely want to follow up with Dr. Ellyn Hack for stress testing or  catheterization.   Breast lesion S: at time of ER visit noted a popped blister on right breast. No pain or itching but less sensation in breast after reduction. She states area is slowly healing/resolving since that time A/P: unclear what caused this. Not on nipple and no nipple discharge- doubt breast cancer but still in differential. If does nto clear within a month I advised follow up. Also sent her a message encouraging mammogram.   Sjogren's disease ([place]) From Dr. Darcus Pester note 01/02/17. Adding this due to merging problem dry mouth and sjogrens "Sjogren's associated with dyspnea. We need to ensure that she doesn't have any involvement of  her heart. Plan: Check 2-D echo"  Essential hypertension, benign S: controlled poorly on losartan alone.  BP Readings from Last 3 Encounters:  01/12/17 138/90  01/02/17 (!) 142/94  12/30/16 (!) 154/78  A/P:Continue current meds for now. Dr. Ellyn Hack mentioned CCB or beta blocker potential but wanted to wait until testing was completed  Firmly encouraged her to follow up in 1 month if breast lesion still present. There is risk of breast cancer though I think that risk is low.   Meds ordered this encounter  Medications  . methotrexate (RHEUMATREX) 2.5 MG tablet    Sig: Take 2.5 mg by mouth once a week. Caution:Chemotherapy. Protect from light.    Return precautions advised.  [name], MD

## 2017-01-12 NOTE — Patient Instructions (Signed)
PLEASE let me know if the spot on the right breast does not resolve in next month  Please check in with me to make sure I got a copy of test results after you get your copy

## 2017-01-16 ENCOUNTER — Other Ambulatory Visit: Payer: Self-pay

## 2017-01-16 ENCOUNTER — Ambulatory Visit (INDEPENDENT_AMBULATORY_CARE_PROVIDER_SITE_OTHER): Payer: BLUE CROSS/BLUE SHIELD

## 2017-01-16 ENCOUNTER — Other Ambulatory Visit: Payer: Self-pay | Admitting: Cardiology

## 2017-01-16 ENCOUNTER — Ambulatory Visit (HOSPITAL_COMMUNITY): Payer: BLUE CROSS/BLUE SHIELD | Attending: Cardiology

## 2017-01-16 DIAGNOSIS — R002 Palpitations: Secondary | ICD-10-CM

## 2017-01-16 DIAGNOSIS — R0609 Other forms of dyspnea: Secondary | ICD-10-CM

## 2017-01-16 DIAGNOSIS — I10 Essential (primary) hypertension: Secondary | ICD-10-CM | POA: Diagnosis not present

## 2017-01-16 DIAGNOSIS — R000 Tachycardia, unspecified: Secondary | ICD-10-CM

## 2017-01-16 DIAGNOSIS — R0600 Dyspnea, unspecified: Secondary | ICD-10-CM

## 2017-01-16 DIAGNOSIS — M3500 Sicca syndrome, unspecified: Secondary | ICD-10-CM | POA: Diagnosis not present

## 2017-01-16 DIAGNOSIS — R079 Chest pain, unspecified: Secondary | ICD-10-CM | POA: Insufficient documentation

## 2017-01-17 NOTE — Progress Notes (Signed)
Echo results: Good news: Essentially normal echocardiogram and normal pump function and normal valve function.  Not unexpected mild diastolic dysfunction (this simply means that the heart does not relax normally - commonly seen as we age) EF: 55-60%. No regional wall motion abnormalities = No signs to suggest heart attack.Glenetta Hew, MD

## 2017-01-29 ENCOUNTER — Encounter: Payer: Self-pay | Admitting: Family Medicine

## 2017-01-30 ENCOUNTER — Other Ambulatory Visit: Payer: Self-pay

## 2017-01-30 ENCOUNTER — Encounter: Payer: Self-pay | Admitting: Family Medicine

## 2017-01-30 DIAGNOSIS — S20111A Abrasion of breast, right breast, initial encounter: Secondary | ICD-10-CM

## 2017-01-30 NOTE — Telephone Encounter (Signed)
Margaret Green- can you set up an urgent referral to dermatology under skin lesion?

## 2017-02-01 ENCOUNTER — Encounter: Payer: Self-pay | Admitting: Family Medicine

## 2017-02-08 ENCOUNTER — Ambulatory Visit (INDEPENDENT_AMBULATORY_CARE_PROVIDER_SITE_OTHER): Payer: BLUE CROSS/BLUE SHIELD | Admitting: Cardiology

## 2017-02-08 ENCOUNTER — Encounter: Payer: Self-pay | Admitting: Cardiology

## 2017-02-08 DIAGNOSIS — R000 Tachycardia, unspecified: Secondary | ICD-10-CM | POA: Diagnosis not present

## 2017-02-08 DIAGNOSIS — I10 Essential (primary) hypertension: Secondary | ICD-10-CM | POA: Diagnosis not present

## 2017-02-08 DIAGNOSIS — R0609 Other forms of dyspnea: Secondary | ICD-10-CM | POA: Diagnosis not present

## 2017-02-08 DIAGNOSIS — R0600 Dyspnea, unspecified: Secondary | ICD-10-CM

## 2017-02-08 NOTE — Patient Instructions (Signed)
NO CHANGES MEDICATION    Your physician wants you to follow-up in [place]. You will receive a reminder letter in the mail two months in advance. If you don't receive a letter, please call our office to schedule the follow-up appointment.

## 2017-02-08 NOTE — Assessment & Plan Note (Signed)
Pretty well-controlled today on ARB. If her palpitations were to return, would consider beta blocker or diltiazem type calcium channel blocker. For now would not treat

## 2017-02-08 NOTE — Progress Notes (Signed)
PCP: Marin Olp, MD  Clinic Note: Chief Complaint  Patient presents with  . Follow-up    Palpitations    HPI: Margaret Green is a 56 y.o. female with a PMH below who presents today for follow-up evaluation of chest pain and rapid heartbeat initially referred at the request of Dr. Venora Maples almost ER as well as her PCP Dr. Yong Channel.  Margaret Green was initially seen on 01/02/2017 for evaluation of her irregular heartbeats. She was placed on a monitor for 2 weeks, and had a 2-D echocardiogram performed.   Studies Personally Reviewed - (if available, images/films reviewed: From Epic Chart or Care Everywhere)  2-D Echo 01/16/2017: Normal LV size and function. EF 55-60%. GR 1 DD. Otherwise trace MR and trace TR. Mostly normal.  Two-week event monitor: Mostly sinus rhythm with some rare PACs and PVCs. One short 5-70 run of PSVT/PAT.  Interval History: Margaret Green presents today overall feeling relatively well. Her palpitations seem to be stabilized, and not of to much concern for her. She denies any prolonged episodes. They're mostly short-lived less than 20-30 seconds.  She has not had nearly the amount of episodes that she had a week or 2 leading up to her visit. Otherwise she's not had any chest tightness or pressure rest or exertion. No lightheadedness, dizziness or syncope/near syncope or TIAs amaurosis fugax. No PND, orthopnea or edema  No claudication.  ROS: A comprehensive was performed. Review of Systems  Constitutional: Negative for malaise/fatigue.  Musculoskeletal: Positive for joint pain.  Psychiatric/Behavioral: The patient is nervous/anxious (Doing better).   All other systems reviewed and are negative.   I have reviewed and (if needed) personally updated the patient's problem list, medications, allergies, past medical and surgical history, social and family history.   Past Medical History:  Diagnosis Date  . 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 Allergy   . Anxiety   . Arthritis   . Endometriosis 08/19/2009   Hysterectomy.     . Family history of diabetes mellitus   . GERD (gastroesophageal reflux disease)   . Headache(784.0)   . Heart murmur    past hx of heart murmur  . Hyperlipidemia   . Hypothyroidism   . Neutropenia   . Raynaud's syndrome   . Sjogrens syndrome ([place])   . Stricture and stenosis of esophagus     Past Surgical History:  Procedure Laterality Date  . ABDOMINAL HYSTERECTOMY    . BREAST BIOPSY     benign  . BREAST REDUCTION SURGERY    . COLONOSCOPY    . HERNIA REPAIR    . POLYPECTOMY    . UPPER GASTROINTESTINAL ENDOSCOPY      Current Meds  Medication Sig  . ALPRAZolam (XANAX) 0.25 MG tablet TAKE ONE TABLET BY MOUTH TWICE DAILY AS NEEDED (Patient taking differently: Take 1 tablet by mouth twice daily as needed for anxiety)  . butalbital-aspirin-caffeine (FIORINAL) 50-325-40 MG capsule take 1 capsule by mouth every 4 hours if needed (Patient taking differently: Take 1 capsule by mouth every 4 (four) hours as needed for migraine. )  . Calcium Carbonate-Vitamin D (CALCIUM 600+D) 600-400 MG-UNIT tablet Take 1 tablet by mouth daily.  . cetirizine (ZYRTEC) 10 MG tablet Take 10 mg by mouth daily.    . cevimeline (EVOXAC) 30 MG capsule Take 30 mg by mouth 2 (two) times daily.   . citalopram (CELEXA) 20 MG tablet TAKE ONE TABLET BY MOUTH ONCE DAILY  .  estradiol (VIVELLE-DOT) 0.05 MG/24HR patch Place 1 patch onto the skin 2 (two) times a week. Pt places on Wednesday and Sunday.  . folic acid (FOLVITE) 1 MG tablet Take 2 mg by mouth daily.  . hyoscyamine (LEVBID) 0.375 MG 12 hr tablet TAKE ONE TABLET BY MOUTH TWICE DAILY  . levothyroxine (SYNTHROID, LEVOTHROID) 75 MCG tablet TAKE ONE TABLET BY MOUTH ONCE DAILY  . losartan (COZAAR) 50 MG tablet TAKE 1 TABLET BY MOUTH ONCE DAILY** NEEDS APPOINTMENT FOR MORE REFILLS**  . methotrexate (RHEUMATREX) 2.5 MG tablet Take 2.5  mg by mouth once a week. Caution:Chemotherapy. Protect from light.  . RABEprazole (ACIPHEX) 20 MG tablet Take 2 tablets (40 mg total) by mouth daily. (Patient taking differently: Take 40 mg by mouth daily before lunch. )  . rosuvastatin (CRESTOR) 20 MG tablet Take 1 tablet (20 mg total) by mouth daily. (Patient taking differently: Take 20 mg by mouth at bedtime. )  . SUMAtriptan (IMITREX) 100 MG tablet Take 100 mg by mouth every 2 (two) hours as needed for migraine.  Marland Kitchen tiZANidine (ZANAFLEX) 4 MG tablet TAKE ONE TABLET BY MOUTH TWICE DAILY AS NEEDED (Patient taking differently: Take 1 tablet by mouth twice daily as needed for muscle spasms)  . zolpidem (AMBIEN) 5 MG tablet TAKE ONE TABLET BY MOUTH AT BEDTIME AS NEEDED FOR SLEEP    Allergies  Allergen Reactions  . Codeine Nausea And Vomiting  . Omeprazole Hives  . Meloxicam Itching and Rash  . Oxycodone-Aspirin Itching and Rash  . Sulfamethoxazole Itching and Rash    Social History   Social History  . Marital status: Single    Spouse name: N/A  . Number of children: N/A  . Years of education: N/A   Social History Main Topics  . Smoking status: Never Smoker  . Smokeless tobacco: Never Used  . Alcohol use 0.0 oz/week     Comment: occasionally  . Drug use: No  . Sexual activity: Yes   Other Topics Concern  . None   Social History Narrative   Gay. Partner for 2 years in 2013. Prior 20 year relationship-still friends. No children. 1 dog-peekapoo.       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       Hobbies: golf, cooking, gardening, poker    family history includes Cirrhosis in her mother; Colon polyps in her father and sister; Coronary artery disease in her father; Diabetes in her paternal grandmother; Esophageal cancer in her paternal aunt; Heart attack (age of onset: 33) in her father; Ovarian cancer in her maternal aunt; Stomach cancer in her  paternal aunt.  Wt Readings from Last 3 Encounters:  02/08/17 192 lb (87.1 kg)  01/12/17 189 lb (85.7 kg)  01/02/17 188 lb 3.2 oz (85.4 kg)    PHYSICAL EXAM BP 112/68   Pulse 64   Ht 5\' 5"  (1.651 m)   Wt 192 lb (87.1 kg)   BMI 31.95 kg/m  General appearance: alert, cooperative, appears stated age, no distress. Mildly obese. Well groomed HEENT: [place]/AT, EOMI, MMM, anicteric sclera Neck: no adenopathy, no carotid bruit and no JVD Lungs: clear to auscultation bilaterally, normal percussion bilaterally and non-labored Heart: regular rate and rhythm, S1 normal & soft-split S2, no murmur, click, rub or gallop; nondisplaced PMI. Abdomen: soft, non-tender; bowel sounds normal; no masses; no HSM/HJR Extremities: extremities normal, atraumatic, no cyanosis, or edema  Neurologic: Mental status: Alert & oriented x 3, thought  content appropriate; non-focal exam.  Pleasant mood & affect.    Adult ECG Report None  Other studies Reviewed: Additional studies/ records that were reviewed today include:  Recent Labs:  None    ASSESSMENT / PLAN: Problem List Items Addressed This Visit    Dyspnea on exertion    Relatively normal echo no structural abnormalities. She is to continue to be active, if symptoms worsen we can consider a more detailed ischemic.      Essential hypertension, benign (Chronic)    Pretty well-controlled today on ARB. If her palpitations were to return, would consider beta blocker or diltiazem type calcium channel blocker. For now would not treat      Tachycardia with hypertension    Unfortunately, we did not capture any of these spells on her monitor. Wouldn't be due she is one short bursts of PAT. That could've been what her rapid heart spells were but that would not go along with high blood pressure. At this point, with her blood pressure it is, I would be inclined not to attempt to treat PACs and PVCs as infrequent as they were.  I would worry more about side effects of  medications then the treatment.         Current medicines are reviewed at length with the patient today. (+/- concerns) None The following changes have been made: None  Patient Instructions  NO CHANGES MEDICATION    Your physician wants you to follow-up in [place]. You will receive a reminder letter in the mail two months in advance. If you don't receive a letter, please call our office to schedule the follow-up appointment.      Studies Ordered:   No orders of the defined types were placed in this encounter.     Glenetta Hew, M.D., M.S. Interventional Cardiologist   Pager # [phone] Phone # [phone] [address] [place], [place] [address]

## 2017-02-08 NOTE — Assessment & Plan Note (Signed)
Relatively normal echo no structural abnormalities. She is to continue to be active, if symptoms worsen we can consider a more detailed ischemic.

## 2017-02-08 NOTE — Assessment & Plan Note (Signed)
Unfortunately, we did not capture any of these spells on her monitor. Wouldn't be due she is one short bursts of PAT. That could've been what her rapid heart spells were but that would not go along with high blood pressure. At this point, with her blood pressure it is, I would be inclined not to attempt to treat PACs and PVCs as infrequent as they were.  I would worry more about side effects of medications then the treatment.

## 2017-02-12 ENCOUNTER — Other Ambulatory Visit: Payer: Self-pay | Admitting: Family Medicine

## 2017-02-15 ENCOUNTER — Other Ambulatory Visit: Payer: Self-pay | Admitting: Family Medicine

## 2017-02-20 ENCOUNTER — Encounter: Payer: Self-pay | Admitting: Family Medicine

## 2017-03-02 ENCOUNTER — Other Ambulatory Visit: Payer: Self-pay | Admitting: Family Medicine

## 2017-03-03 ENCOUNTER — Other Ambulatory Visit: Payer: Self-pay

## 2017-03-03 MED ORDER — ZOLPIDEM TARTRATE 5 MG PO TABS: 5.0000 mg | ORAL_TABLET | Freq: Every evening | ORAL | 3 refills | Status: DC | PRN

## 2017-03-03 NOTE — Telephone Encounter (Signed)
Should be 5mg  considering she is a female. Margaret Green- can you see if she is ok with 5mg  refill please?

## 2017-03-15 ENCOUNTER — Other Ambulatory Visit: Payer: Self-pay | Admitting: Family Medicine

## 2017-03-28 ENCOUNTER — Ambulatory Visit (INDEPENDENT_AMBULATORY_CARE_PROVIDER_SITE_OTHER): Payer: BLUE CROSS/BLUE SHIELD | Admitting: Endocrinology

## 2017-03-28 ENCOUNTER — Encounter: Payer: Self-pay | Admitting: Endocrinology

## 2017-03-28 ENCOUNTER — Encounter: Payer: Self-pay | Admitting: Family Medicine

## 2017-03-28 VITALS — BP 128/82 | HR 69 | Ht 65.0 in | Wt 184.0 lb

## 2017-03-28 DIAGNOSIS — E038 Other specified hypothyroidism: Secondary | ICD-10-CM

## 2017-03-28 DIAGNOSIS — E042 Nontoxic multinodular goiter: Secondary | ICD-10-CM | POA: Diagnosis not present

## 2017-03-28 LAB — TSH: TSH: 2.84 u[IU]/mL (ref 0.35–4.50)

## 2017-03-28 LAB — T3, FREE: T3 FREE: 3.4 pg/mL (ref 2.3–4.2)

## 2017-03-28 LAB — T4, FREE: FREE T4: 0.88 ng/dL (ref 0.60–1.60)

## 2017-03-28 NOTE — Patient Instructions (Signed)
blood tests are requested for you today.  We'll let you know about the results. Let's recheck the ultrasound.  you will receive a phone call, about a day and time for an appointment. Please return in 1 year.

## 2017-03-28 NOTE — Progress Notes (Signed)
Subjective:    Patient ID: Margaret Green, female    DOB: [birth year], 56 y.o.   MRN: [id number]  HPI Pt returns for chronic primary hypothyroidism (dx'ed 1982; she has been on prescribed thyroid hormone therapy since then; over the past few years, the synthroid dosage has varied between 75 and 100 mcg/day; [place] in 2015 showed small multinodular goiter; f/u US in 2016 showed little change).  She does not notice the thyroid nodules. pt states she feels well in general, except for fatigue.   Past Medical History:  Diagnosis Date  . Adenocarcinoma, colon ([place]) 07/23/2015   Removed completely by polypectomy 06/2015   . Allergy   . Anxiety   . Arthritis   . Endometriosis 08/19/2009   Hysterectomy.     . Family history of diabetes mellitus   . GERD (gastroesophageal reflux disease)   . Headache(784.0)   . Heart murmur    past hx of heart murmur  . Hyperlipidemia   . Hypothyroidism   . Neutropenia   . Raynaud's syndrome   . Sjogrens syndrome ([place])   . Stricture and stenosis of esophagus     Past Surgical History:  Procedure Laterality Date  . ABDOMINAL HYSTERECTOMY    . BREAST BIOPSY     benign  . BREAST REDUCTION SURGERY    . COLONOSCOPY    . HERNIA REPAIR    . POLYPECTOMY    . UPPER GASTROINTESTINAL ENDOSCOPY      Social History   Social History  . Marital status: Single    Spouse name: N/A  . Number of children: N/A  . Years of education: N/A   Occupational History  . Not on file.   Social History Main Topics  . Smoking status: Never Smoker  . Smokeless tobacco: Never Used  . Alcohol use 0.0 oz/week     Comment: occasionally  . Drug use: No  . Sexual activity: Yes   Other Topics Concern  . Not on file   Social History Narrative   Gay. Partner for 2 years in 2013. Prior 20 year relationship-still friends. No children. 1 dog-peekapoo.       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       Hobbies: golf, cooking, gardening, poker    Allergies  Allergen Reactions  . Codeine Nausea And Vomiting  . Omeprazole Hives  . Meloxicam Itching and Rash  . Oxycodone-Aspirin Itching and Rash  . Sulfamethoxazole Itching and Rash    Family History  Problem Relation Age of Onset  . Coronary artery disease Father        age 79 - 47 stents, all 3 siblings with early cardiac disease  . Colon polyps Father   . Heart attack Father 90  . Cirrhosis Mother        alcohol related  . Ovarian cancer Maternal Aunt   . Stomach cancer Paternal Aunt   . Esophageal cancer Paternal Aunt   . Diabetes Paternal Grandmother        and aunt  . Colon polyps Sister   . Colon cancer Neg Hx   . Rectal cancer Neg Hx     BP 128/82   Pulse 69   Ht 5\' 5"  (1.651 m)   Wt 184 lb (83.5 kg)   SpO2 95%   BMI 30.62 kg/m    Review of Systems She has slight swelling of the legs.  Objective:   Physical Exam VITAL SIGNS:  See vs page GENERAL: no distress NECK: 3 cm right thyroid nodule is noted.  No palpable lymphadenopathy at the anterior neck.       Assessment & Plan:  Nodular goiter.  The right nodule is now discrete and palpable, and will therefore prob will need bx, based on [place].   Hypothyroidism: due for recheck.    Patient Instructions  blood tests are requested for you today.  We'll let you know about the results. Let's recheck the ultrasound.  you will receive a phone call, about a day and time for an appointment. Please return in 1 year.

## 2017-03-31 ENCOUNTER — Encounter: Payer: Self-pay | Admitting: Family Medicine

## 2017-03-31 ENCOUNTER — Ambulatory Visit (INDEPENDENT_AMBULATORY_CARE_PROVIDER_SITE_OTHER): Payer: BLUE CROSS/BLUE SHIELD | Admitting: Family Medicine

## 2017-03-31 VITALS — BP 132/90 | HR 85 | Temp 98.8°F | Ht 65.0 in | Wt 181.4 lb

## 2017-03-31 DIAGNOSIS — F331 Major depressive disorder, recurrent, moderate: Secondary | ICD-10-CM

## 2017-03-31 DIAGNOSIS — F411 Generalized anxiety disorder: Secondary | ICD-10-CM

## 2017-03-31 MED ORDER — CLONAZEPAM 0.5 MG PO TABS: 0.5000 mg | ORAL_TABLET | Freq: Two times a day (BID) | ORAL | 1 refills | Status: DC | PRN

## 2017-03-31 NOTE — Patient Instructions (Signed)
Lets trial klonopin twice a day scheduled for 1 week. If you are doing better trial half a tablet after 1 week or can wait until 2 weeks to go down to half a tablet. Then reduce by week 3 or 4 to once a day.   Follow up 6 weeks. Continue celexa 20mg 

## 2017-03-31 NOTE — Progress Notes (Signed)
Subjective:  Margaret Green is a 56 y.o. year old very pleasant female patient who presents for/with See problem oriented charting ROS- depressed mood and anhedonia, denies SI. Increased anxiety and palpitations with this.    Depression screen PHQ 2/9 03/31/2017  Decreased Interest 1  Down, Depressed, Hopeless 2  PHQ - 2 Score 3  Altered sleeping 3  Tired, decreased energy 3  Change in appetite 3  Feeling bad or failure about yourself  0  Trouble concentrating 3  Moving slowly or fidgety/restless 0  Suicidal thoughts 0  PHQ-9 Score 15     Past Medical History-  Patient Active Problem List   Diagnosis Date Noted  . Sjogren's disease ([place]) 08/27/2015    Priority: High  . Adenocarcinoma, colon ([place]) 07/23/2015    Priority: High  . Depression 07/23/2015    Priority: Medium  . Migraine 08/14/2014    Priority: Medium  . IBS (irritable bowel syndrome) 08/14/2014    Priority: Medium  . Essential hypertension, benign 08/14/2014    Priority: Medium  . GERD (gastroesophageal reflux disease) 08/14/2014    Priority: Medium  . Right thyroid nodule 09/24/2013    Priority: Medium  . GAD (generalized anxiety disorder) 09/29/2010    Priority: Medium  . Hyperlipidemia 04/13/2007    Priority: Medium  . Fibromyalgia 04/13/2007    Priority: Medium  . Hypothyroidism 04/03/2007    Priority: Medium  . Allergic rhinitis 08/14/2014    Priority: Low  . Neutropenia ([place]) 07/23/2009    Priority: Low  . ANEMIA, PERNICIOUS 02/24/2009    Priority: Low  . SPRAIN/STRAIN, LUMBAR REGION 04/16/2007    Priority: Low  . RAYNAUD'S DISEASE 04/13/2007    Priority: Low  . Osteoarthritis 04/13/2007    Priority: Low  . Multinodular goiter 03/28/2017  . Chest pain with low risk for cardiac etiology 01/02/2017  . Dyspnea on exertion 01/02/2017  . Tachycardia with hypertension 01/02/2017  . Hoarseness 03/10/2015    Medications- reviewed and updated Current Outpatient Prescriptions  Medication  Sig Dispense Refill  . ALPRAZolam (XANAX) 0.25 MG tablet TAKE ONE TABLET BY MOUTH TWICE DAILY AS NEEDED (Patient taking differently: Take 1 tablet by mouth twice daily as needed for anxiety) 30 tablet 1  . butalbital-aspirin-caffeine (FIORINAL) 50-325-40 MG capsule take 1 capsule by mouth every 4 hours if needed (Patient taking differently: Take 1 capsule by mouth every 4 (four) hours as needed for migraine. ) 30 capsule 5  . Calcium Carbonate-Vitamin D (CALCIUM 600+D) 600-400 MG-UNIT tablet Take 1 tablet by mouth daily.    . cetirizine (ZYRTEC) 10 MG tablet Take 10 mg by mouth daily.      . cevimeline (EVOXAC) 30 MG capsule Take 30 mg by mouth 2 (two) times daily.     . citalopram (CELEXA) 20 MG tablet TAKE ONE TABLET BY MOUTH ONCE DAILY 90 tablet 1  . estradiol (VIVELLE-DOT) 0.05 MG/24HR patch Place 1 patch onto the skin 2 (two) times a week. Pt places on Wednesday and Sunday.    . folic acid (FOLVITE) 1 MG tablet Take 2 mg by mouth daily.    Marland Kitchen gabapentin (NEURONTIN) 100 MG capsule Take 200 mg by mouth at bedtime.    . hyoscyamine (LEVBID) 0.375 MG 12 hr tablet TAKE ONE TABLET BY MOUTH TWICE DAILY 60 tablet 0  . levothyroxine (SYNTHROID, LEVOTHROID) 75 MCG tablet TAKE ONE TABLET BY MOUTH ONCE DAILY 90 tablet 3  . losartan (COZAAR) 50 MG tablet TAKE 1 TABLET BY MOUTH ONCE DAILY  NEED APPOINTMENT FOR MORE REFILLS  30 tablet 0  . methotrexate (RHEUMATREX) 2.5 MG tablet Take by mouth once a week. Caution:Chemotherapy. Protect from light. 4 pills weekly    . RABEprazole (ACIPHEX) 20 MG tablet Take 2 tablets (40 mg total) by mouth daily. (Patient taking differently: Take 40 mg by mouth daily before lunch. ) 120 tablet 3  . rosuvastatin (CRESTOR) 20 MG tablet Take 1 tablet (20 mg total) by mouth daily. (Patient taking differently: Take 20 mg by mouth at bedtime. ) 90 tablet 1  . SUMAtriptan (IMITREX) 100 MG tablet Take 100 mg by mouth every 2 (two) hours as needed for migraine.    Marland Kitchen tiZANidine (ZANAFLEX)  4 MG tablet TAKE ONE TABLET BY MOUTH TWICE DAILY AS NEEDED (Patient taking differently: Take 1 tablet by mouth twice daily as needed for muscle spasms) 180 tablet 1  . zolpidem (AMBIEN) 5 MG tablet Take 1 tablet (5 mg total) by mouth at bedtime as needed. for sleep 30 tablet 3   No current facility-administered medications for this visit.     Objective: BP 132/90 (BP Location: Left Arm, Patient Position: Sitting, Cuff Size: Large)   Pulse 85   Temp 98.8 F (37.1 C) (Oral)   Ht 5\' 5"  (1.651 m)   Wt 181 lb 6.4 oz (82.3 kg)   SpO2 96%   BMI 30.19 kg/m  Gen: NAD, resting comfortably CV: RRR  Lungs: nonlabored Ext: no edema Skin: warm, dry Psych: tearful at times, depressed mood  Assessment/Plan:    GAD (generalized anxiety disorder) Depression S: Patient has had a difficult time with potential loss of partner for 4.5 years- looks like relationship will be ending and states she is suffering from a "broken heart" This has caused her to have several periods of paplitations/anxiety/feeling jittery. She has increased xanax to 4x a day and seems to work for 1-2 hours at least stopping cold feeling and jittery feeling. Also recent stressor that may need thyroid biopsy- also prior colon cancer weighs on her. . Years ago was on klonopin and gave her more consistent relief- she asks about something longer acting to reduce high/lows.   She increased her celexa to full tablet 2 weeks ago  PHQ9 of 15. GAD7 12. Has lost 11 lbs in 3 weeks due to loose stools and low appetite.TSH normal  just 4 days ago so that is not likely the cause.  A/P: Acute worsening of depression and anxiety Patient Instructions  Lets trial klonopin twice a day scheduled for 1 week. If you are doing better trial half a tablet after 1 week or can wait until 2 weeks to go down to half a tablet. Then reduce by week 3 or 4 to once a day.   Follow up 6 weeks. Continue celexa 20mg    6 weeks but strongly encouraged her to  follow up sooner if she thought she needed to- states she will reach out by mychart as she did this time  Strongly suspect palpitations due to stess- if they persist or worsen could get EKG- recently had reassuring cardiac workup  Meds ordered this encounter  Medications  . gabapentin (NEURONTIN) 100 MG capsule    Sig: Take 200 mg by mouth at bedtime.  . clonazePAM (KLONOPIN) 0.5 MG tablet    Sig: Take 1 tablet (0.5 mg total) by mouth 2 (two) times daily as needed for anxiety.    Dispense:  60 tablet    Refill:  1   The duration of  face-to-face time during this visit was greater than 25 minutes. Greater than 50% of this time was spent in counseling, explanation of diagnosis, planning of further management, and/or coordination of care including counseling about acute stressor, anxiety, depression and comforting patient.     Return precautions advised.  [name], MD

## 2017-04-01 NOTE — Assessment & Plan Note (Signed)
Depression S: Patient has had a difficult time with potential loss of partner for 4.5 years- looks like relationship will be ending and states she is suffering from a "broken heart" This has caused her to have several periods of paplitations/anxiety/feeling jittery. She has increased xanax to 4x a day and seems to work for 1-2 hours at least stopping cold feeling and jittery feeling. Also recent stressor that may need thyroid biopsy- also prior colon cancer weighs on her. . Years ago was on klonopin and gave her more consistent relief- she asks about something longer acting to reduce high/lows.   She increased her celexa to full tablet 2 weeks ago  PHQ9 of 15. GAD7 12. Has lost 11 lbs in 3 weeks due to loose stools and low appetite.TSH normal  just 4 days ago so that is not likely the cause.  A/P: Acute worsening of depression and anxiety Patient Instructions  Lets trial klonopin twice a day scheduled for 1 week. If you are doing better trial half a tablet after 1 week or can wait until 2 weeks to go down to half a tablet. Then reduce by week 3 or 4 to once a day.   Follow up 6 weeks. Continue celexa 20mg 

## 2017-04-03 ENCOUNTER — Other Ambulatory Visit: Payer: Self-pay

## 2017-04-03 ENCOUNTER — Encounter: Payer: Self-pay | Admitting: Family Medicine

## 2017-04-04 ENCOUNTER — Other Ambulatory Visit: Payer: Self-pay

## 2017-04-04 MED ORDER — DIPHENOXYLATE-ATROPINE 2.5-0.025 MG PO TABS: 1.0000 | ORAL_TABLET | Freq: Four times a day (QID) | ORAL | 2 refills | Status: DC

## 2017-04-07 ENCOUNTER — Ambulatory Visit
Admission: RE | Admit: 2017-04-07 | Discharge: 2017-04-07 | Disposition: A | Discharge status: Home / Self Care | Payer: BLUE CROSS/BLUE SHIELD | Source: Ambulatory Visit | Attending: Endocrinology | Admitting: Endocrinology

## 2017-04-07 ENCOUNTER — Encounter: Payer: Self-pay | Admitting: Endocrinology

## 2017-04-07 DIAGNOSIS — E042 Nontoxic multinodular goiter: Secondary | ICD-10-CM

## 2017-04-17 ENCOUNTER — Encounter: Payer: Self-pay | Admitting: Family Medicine

## 2017-04-17 MED ORDER — BUSPIRONE HCL 5 MG PO TABS: 5.0000 mg | ORAL_TABLET | Freq: Two times a day (BID) | ORAL | 1 refills | Status: DC

## 2017-04-19 ENCOUNTER — Other Ambulatory Visit: Payer: Self-pay | Admitting: Family Medicine

## 2017-04-28 ENCOUNTER — Encounter: Payer: Self-pay | Admitting: Family Medicine

## 2017-05-04 ENCOUNTER — Ambulatory Visit: Payer: BLUE CROSS/BLUE SHIELD | Admitting: Family Medicine

## 2017-05-24 ENCOUNTER — Other Ambulatory Visit: Payer: Self-pay | Admitting: Family Medicine

## 2017-05-30 ENCOUNTER — Other Ambulatory Visit: Payer: Self-pay

## 2017-05-30 ENCOUNTER — Encounter: Payer: Self-pay | Admitting: Family Medicine

## 2017-05-30 DIAGNOSIS — Z13820 Encounter for screening for osteoporosis: Secondary | ICD-10-CM

## 2017-06-01 ENCOUNTER — Other Ambulatory Visit: Payer: Self-pay | Admitting: Family Medicine

## 2017-06-01 ENCOUNTER — Encounter: Payer: Self-pay | Admitting: Family Medicine

## 2017-06-01 ENCOUNTER — Ambulatory Visit (INDEPENDENT_AMBULATORY_CARE_PROVIDER_SITE_OTHER): Payer: BLUE CROSS/BLUE SHIELD | Admitting: Family Medicine

## 2017-06-01 VITALS — BP 114/78 | HR 56 | Temp 98.5°F | Resp 16 | Wt 174.8 lb

## 2017-06-01 DIAGNOSIS — N644 Mastodynia: Secondary | ICD-10-CM

## 2017-06-01 DIAGNOSIS — M8580 Other specified disorders of bone density and structure, unspecified site: Secondary | ICD-10-CM

## 2017-06-01 DIAGNOSIS — S20121A Blister (nonthermal) of breast, right breast, initial encounter: Secondary | ICD-10-CM | POA: Diagnosis not present

## 2017-06-01 DIAGNOSIS — F331 Major depressive disorder, recurrent, moderate: Secondary | ICD-10-CM | POA: Diagnosis not present

## 2017-06-01 NOTE — Progress Notes (Signed)
Subjective:  Margaret Green is a 56 y.o. year old very pleasant female patient who presents for/with See problem oriented charting ROS- admits to crying spells after loss of long term partner but much improved overall. No fever or chills. Does have blistered area on breast.    Past Medical History-  Patient Active Problem List   Diagnosis Date Noted  . Sjogren's disease ([place]) 08/27/2015    Priority: High  . Adenocarcinoma, colon ([place]) 07/23/2015    Priority: High  . Depression 07/23/2015    Priority: Medium  . Migraine 08/14/2014    Priority: Medium  . IBS (irritable bowel syndrome) 08/14/2014    Priority: Medium  . Essential hypertension, benign 08/14/2014    Priority: Medium  . GERD (gastroesophageal reflux disease) 08/14/2014    Priority: Medium  . Right thyroid nodule 09/24/2013    Priority: Medium  . GAD (generalized anxiety disorder) 09/29/2010    Priority: Medium  . Hyperlipidemia 04/13/2007    Priority: Medium  . Fibromyalgia 04/13/2007    Priority: Medium  . Hypothyroidism 04/03/2007    Priority: Medium  . Allergic rhinitis 08/14/2014    Priority: Low  . Neutropenia ([place]) 07/23/2009    Priority: Low  . ANEMIA, PERNICIOUS 02/24/2009    Priority: Low  . SPRAIN/STRAIN, LUMBAR REGION 04/16/2007    Priority: Low  . RAYNAUD'S DISEASE 04/13/2007    Priority: Low  . Osteoarthritis 04/13/2007    Priority: Low  . Multinodular goiter 03/28/2017  . Chest pain with low risk for cardiac etiology 01/02/2017  . Dyspnea on exertion 01/02/2017  . Tachycardia with hypertension 01/02/2017  . Hoarseness 03/10/2015    Medications- reviewed and updated Current Outpatient Prescriptions  Medication Sig Dispense Refill  . buPROPion (WELLBUTRIN XL) 150 MG 24 hr tablet Take 150 mg by mouth daily. x1 week, then increase to 300 mg (per psychiatry)    . buPROPion (WELLBUTRIN XL) 300 MG 24 hr tablet Take 300 mg by mouth daily.    . busPIRone (BUSPAR) 5 MG tablet Take 1 tablet (5  mg total) by mouth 2 (two) times daily. 60 tablet 1  . butalbital-aspirin-caffeine (FIORINAL) 50-325-40 MG capsule take 1 capsule by mouth every 4 hours if needed (Patient taking differently: Take 1 capsule by mouth every 4 (four) hours as needed for migraine. ) 30 capsule 5  . Calcium Carbonate-Vitamin D (CALCIUM 600+D) 600-400 MG-UNIT tablet Take 1 tablet by mouth daily.    . cetirizine (ZYRTEC) 10 MG tablet Take 10 mg by mouth daily.      . cevimeline (EVOXAC) 30 MG capsule Take 30 mg by mouth 2 (two) times daily.     . clonazePAM (KLONOPIN) 0.5 MG tablet Take 1 tablet (0.5 mg total) by mouth 2 (two) times daily as needed for anxiety. 60 tablet 1  . diphenoxylate-atropine (LOMOTIL) 2.5-0.025 MG tablet Take 1 tablet by mouth 4 (four) times daily. 120 tablet 2  . estradiol (VIVELLE-DOT) 0.05 MG/24HR patch Place 1 patch onto the skin 2 (two) times a week. Pt places on Wednesday and Sunday.    . folic acid (FOLVITE) 1 MG tablet Take 2 mg by mouth daily.    Marland Kitchen gabapentin (NEURONTIN) 100 MG capsule Take 200 mg by mouth at bedtime.    . hyoscyamine (LEVBID) 0.375 MG 12 hr tablet TAKE 1 TABLET BY MOUTH TWICE DAILY 180 tablet 1  . levothyroxine (SYNTHROID, LEVOTHROID) 75 MCG tablet TAKE ONE TABLET BY MOUTH ONCE DAILY 90 tablet 3  . losartan (COZAAR) 50 MG  tablet Take 1 tablet (50 mg total) by mouth daily. 90 tablet 1  . methotrexate (RHEUMATREX) 2.5 MG tablet Take by mouth once a week. Caution:Chemotherapy. Protect from light. 4 pills weekly    . RABEprazole (ACIPHEX) 20 MG tablet Take 2 tablets (40 mg total) by mouth daily. (Patient taking differently: Take 40 mg by mouth daily before lunch. ) 120 tablet 3  . rosuvastatin (CRESTOR) 20 MG tablet Take 1 tablet (20 mg total) by mouth daily. (Patient taking differently: Take 20 mg by mouth at bedtime. ) 90 tablet 1  . SUMAtriptan (IMITREX) 100 MG tablet Take 100 mg by mouth every 2 (two) hours as needed for migraine.    Marland Kitchen tiZANidine (ZANAFLEX) 4 MG tablet  TAKE ONE TABLET BY MOUTH TWICE DAILY AS NEEDED 180 tablet 1  . zolpidem (AMBIEN) 5 MG tablet Take 1 tablet (5 mg total) by mouth at bedtime as needed. for sleep 30 tablet 3  . citalopram (CELEXA) 20 MG tablet TAKE ONE TABLET BY MOUTH ONCE DAILY (Patient not taking: Reported on 06/01/2017) 90 tablet 1   No current facility-administered medications for this visit.     Objective: BP 114/78 (BP Location: Left Arm, Patient Position: Sitting, Cuff Size: Normal)   Pulse (!) 56   Temp 98.5 F (36.9 C) (Oral)   Resp 16   Wt 174 lb 12.8 oz (79.3 kg)   SpO2 99%   BMI 29.09 kg/m  Gen: NAD, resting comfortably CV: RRR no murmurs rubs or gallops Lungs: CTAB no crackles, wheeze, rhonchi Right breast with hypopigmented patch about 4 x 4 cm. To right of this is the base of a prior blister (shows pictures) x2 - one is 3 x 3 cm or less and other is 1 -2 x 1-2 cm  Ext: no edema Skin: warm, dry, mild erythema around these lesions Breast exam otherwise normal  Assessment/Plan: Breast pain, right - Plan: MM DIAG BREAST TOMO BILATERAL, CANCELED: MM Digital Diagnostic Bilat  Blister of right breast, initial encounter - Plan: MM DIAG BREAST TOMO BILATERAL, CANCELED: MM Digital Diagnostic Bilat S: Dermatology visit was scheduled 05/22/17 at 230 PM. Dermatology- Saw them for prior right breast lesion- it was much improved and they did not suggest further follow up.   Last night noted blister on right breast to the right of prior lesion. She popped it last night, drained and iced it. Pale or clear fluid came out with no smell. Still draining. No pain. Put neosporin on it. Saw chiropractor this morning who advised mammogram with breast center- was not able to be referred from them. Tried to have dermatology see her but theycould not.   Overdue for mammogram regardless.  A/P:Discussed friction blister most likely on differential. Will be interesting if develops into extensively scabbed over area like it did on  opposite breast.   Patient Instructions  I think this is likely a friction blister. Not sure why you keep getting these. Could be drug reaction but I do not strongly suspect this. Think breast cancer very low on list but will try to get you in for mammogram in next few days and glad you have the dermatology visit on Monday  Would use vaseline twice a day for the next week until this area heals. I think your picture following of the lesion is a good idea to see if it develops similar to the last area.    Depression Now managed by psychiatry but she wanted to share with me she feel smuch  improved. Citalopram 10mg  in past. Abilify= nosebleeds under psychiatry. Switched to wellbutrin 300mg  XR alone plus Also on buspar and clonazepam   Future Appointments Date Time Provider [place]  06/07/2017 8:40 AM [facility] DIAG TOMO 1 [facility]  06/07/2017 8:50 AM [facility] [place] 1 [facility]  06/26/2017 8:00 AM [facility] DX DEXA 1 [facility]BRE[facility]  03/28/2018 7:45 AM Renato Shin, MD [facility] None   No Follow-up on file.  Orders Placed This Encounter  Procedures  . MM DIAG BREAST TOMO BILATERAL    [employer]  CO-SIGN REQ 9/20 PF: 2015 [facility]  CR/PT     Standing Status:   Future    Standing Expiration Date:   08/01/2018    Order Specific Question:   Reason for Exam (SYMPTOM  OR DIAGNOSIS REQUIRED)    Answer:   right breast pain and blister. Can change to right side only but is due for screening mammogram regardless.    Order Specific Question:   Is the patient pregnant?    Answer:   No    Order Specific Question:   Preferred imaging location?    Answer:   [facility]    Meds ordered this encounter  Medications  . buPROPion (WELLBUTRIN XL) 150 MG 24 hr tablet    Sig: Take 150 mg by mouth daily. x1 week, then increase to 300 mg (per psychiatry)  . buPROPion (WELLBUTRIN XL) 300 MG 24 hr tablet    Sig: Take 300 mg by mouth daily.   The duration of  face-to-face time during this visit was greater than 25 minutes. Greater than 50% of this time was spent in counseling, explanation of diagnosis, planning of further management, and/or coordination of care including counseling on patient concerns- very concerned about breast cancer- came in at first crying and was able to be consoled by me and staff. .     Return precautions advised.  [name], MD

## 2017-06-01 NOTE — Patient Instructions (Signed)
I think this is likely a friction blister. Not sure why you keep getting these. Could be drug reaction but I do not strongly suspect this. Think breast cancer very low on list but will try to get you in for mammogram in next few days and glad you have the dermatology visit on Monday  Would use vaseline twice a day for the next week until this area heals. I think your picture following of the lesion is a good idea to see if it develops similar to the last area.

## 2017-06-04 NOTE — Assessment & Plan Note (Signed)
Now managed by psychiatry but she wanted to share with me she feel smuch improved. Citalopram 10mg  in past. Abilify= nosebleeds under psychiatry. Switched to wellbutrin 300mg  XR alone plus Also on buspar and clonazepam

## 2017-06-05 ENCOUNTER — Encounter: Payer: Self-pay | Admitting: Family Medicine

## 2017-06-06 ENCOUNTER — Telehealth: Payer: Self-pay | Admitting: Family Medicine

## 2017-06-06 NOTE — Telephone Encounter (Signed)
Pt would like to know if you could take her on as a pt, [place] is moving to [place] and she live [place] ?

## 2017-06-06 NOTE — Telephone Encounter (Signed)
I will accept her 

## 2017-06-07 ENCOUNTER — Other Ambulatory Visit: Payer: BLUE CROSS/BLUE SHIELD

## 2017-06-08 ENCOUNTER — Other Ambulatory Visit: Payer: Self-pay

## 2017-06-08 DIAGNOSIS — M549 Dorsalgia, unspecified: Principal | ICD-10-CM

## 2017-06-08 DIAGNOSIS — G8929 Other chronic pain: Secondary | ICD-10-CM

## 2017-06-08 MED ORDER — ROSUVASTATIN CALCIUM 20 MG PO TABS: 20.0000 mg | ORAL_TABLET | Freq: Every day | ORAL | 1 refills | Status: DC

## 2017-06-19 NOTE — Progress Notes (Signed)
Subjective:    Patient ID: Margaret Green, female    DOB: [birth year], 56 y.o.   MRN: [id number]  HPI  She is here to establish with a new pcp.  The patient is here for follow up.  Hypertension: She is taking her medication daily, but did not take it today. She is compliant with a low sodium diet.  She denies chest pain, edema, shortness of breath and regular headaches. She is exercising very little.  She does monitor her blood pressure at home, but does it periodically.    Hyperlipidemia: She is taking her medication daily. She is compliant with a low fat/cholesterol diet. She is not exercising regularly. She denies myalgias.   Hypothyroidism:  She is taking her medication daily.  She denies any recent changes in energy or weight that are unexplained.   GERD:  She follows with GI.  She is taking her medication daily as needed - 3-4 times a week before eating certain foods.   She denies any GERD symptoms, except on a rare occasion and feels her GERD is well controlled taking the medication as needed.   Depression, anxiety:  She is following with a therapist.  Her medication was recently adjusted.  She is taking her medication daily as prescribed. She denies any side effects from the medication. She feels her depression is well controlled and she is happy with her current dose of medication.   Difficulty sleeping:  She rarely takes [place].  She was placed on Risperdal recently and that has helped.   Breast lesion;  She has seen dermatology and a biopsy showed dermatitis.  The first episode was the end of April - she had a blister that busted and then she was left with a scab.  It got better and went away.  Three weeks ago she had another blister.  There was yellowish fluid, thicker than water, but not pus.  She saw the skin surgery center and had a biopsy.  She has follow up with dermatology.    She has had one breast reduction 13 years ago and has been having increased back pain.  She  has neck and shoulder pain in addition to lower back pain.  She has lower back spasms.  She has a consultation regarding having another breast reduction.    H/o colon cancer:  This was treated by removal with colonoscopy.  She is up to date with her colonoscopy.  She denies concerning GI symptoms.   Medications and allergies reviewed with patient and updated if appropriate.  Patient Active Problem List   Diagnosis Date Noted  . Multinodular goiter 03/28/2017  . Chest pain with low risk for cardiac etiology 01/02/2017  . Dyspnea on exertion 01/02/2017  . Tachycardia with hypertension 01/02/2017  . Sjogren's disease ([place]) 08/27/2015  . Depression 07/23/2015  . Adenocarcinoma, colon ([place]) 07/23/2015  . Hoarseness 03/10/2015  . Migraine 08/14/2014  . Allergic rhinitis 08/14/2014  . IBS (irritable bowel syndrome) 08/14/2014  . Essential hypertension, benign 08/14/2014  . GERD (gastroesophageal reflux disease) 08/14/2014  . Right thyroid nodule 09/24/2013  . GAD (generalized anxiety disorder) 09/29/2010  . Neutropenia ([place]) 07/23/2009  . ANEMIA, PERNICIOUS 02/24/2009  . SPRAIN/STRAIN, LUMBAR REGION 04/16/2007  . Hyperlipidemia 04/13/2007  . RAYNAUD'S DISEASE 04/13/2007  . Osteoarthritis 04/13/2007  . Fibromyalgia 04/13/2007  . Hypothyroidism 04/03/2007    Current Outpatient Prescriptions on File Prior to Visit  Medication Sig Dispense Refill  . buPROPion (WELLBUTRIN XL) 300 MG 24 hr tablet  Take 300 mg by mouth daily.    . busPIRone (BUSPAR) 5 MG tablet Take 1 tablet (5 mg total) by mouth 2 (two) times daily. 60 tablet 1  . butalbital-aspirin-caffeine (FIORINAL) 50-325-40 MG capsule take 1 capsule by mouth every 4 hours if needed (Patient taking differently: Take 1 capsule by mouth every 4 (four) hours as needed for migraine. ) 30 capsule 5  . Calcium Carbonate-Vitamin D (CALCIUM 600+D) 600-400 MG-UNIT tablet Take 1 tablet by mouth daily.    . cetirizine (ZYRTEC) 10 MG tablet Take 10  mg by mouth daily.      . cevimeline (EVOXAC) 30 MG capsule Take 30 mg by mouth 2 (two) times daily.     . citalopram (CELEXA) 20 MG tablet TAKE ONE TABLET BY MOUTH ONCE DAILY (Patient not taking: Reported on 06/01/2017) 90 tablet 1  . clonazePAM (KLONOPIN) 0.5 MG tablet Take 1 tablet (0.5 mg total) by mouth 2 (two) times daily as needed for anxiety. 60 tablet 1  . diphenoxylate-atropine (LOMOTIL) 2.5-0.025 MG tablet Take 1 tablet by mouth 4 (four) times daily. 120 tablet 2  . estradiol (VIVELLE-DOT) 0.05 MG/24HR patch Place 1 patch onto the skin 2 (two) times a week. Pt places on Wednesday and Sunday.    . folic acid (FOLVITE) 1 MG tablet Take 2 mg by mouth daily.    Marland Kitchen gabapentin (NEURONTIN) 100 MG capsule Take 200 mg by mouth at bedtime.    . hyoscyamine (LEVBID) 0.375 MG 12 hr tablet TAKE 1 TABLET BY MOUTH TWICE DAILY 180 tablet 1  . levothyroxine (SYNTHROID, LEVOTHROID) 75 MCG tablet TAKE ONE TABLET BY MOUTH ONCE DAILY 90 tablet 3  . losartan (COZAAR) 50 MG tablet Take 1 tablet (50 mg total) by mouth daily. 90 tablet 1  . methotrexate (RHEUMATREX) 2.5 MG tablet Take by mouth once a week. Caution:Chemotherapy. Protect from light. 4 pills weekly    . RABEprazole (ACIPHEX) 20 MG tablet Take 2 tablets (40 mg total) by mouth daily. (Patient taking differently: Take 40 mg by mouth daily before lunch. ) 120 tablet 3  . rosuvastatin (CRESTOR) 20 MG tablet Take 1 tablet (20 mg total) by mouth daily. 90 tablet 1  . SUMAtriptan (IMITREX) 100 MG tablet Take 100 mg by mouth every 2 (two) hours as needed for migraine.    Marland Kitchen tiZANidine (ZANAFLEX) 4 MG tablet TAKE ONE TABLET BY MOUTH TWICE DAILY AS NEEDED 180 tablet 1  . zolpidem (AMBIEN) 5 MG tablet Take 1 tablet (5 mg total) by mouth at bedtime as needed. for sleep 30 tablet 3   No current facility-administered medications on file prior to visit.     Past Medical History:  Diagnosis Date  . Adenocarcinoma, colon ([place]) 07/23/2015   Removed completely by  polypectomy 06/2015   . Allergy   . Anxiety   . Arthritis   . Endometriosis 08/19/2009   Hysterectomy.     . Family history of diabetes mellitus   . GERD (gastroesophageal reflux disease)   . Headache(784.0)   . Heart murmur    past hx of heart murmur  . Hyperlipidemia   . Hypothyroidism   . Neutropenia   . Raynaud's syndrome   . Sjogrens syndrome ([place])   . Stricture and stenosis of esophagus     Past Surgical History:  Procedure Laterality Date  . ABDOMINAL HYSTERECTOMY    . BREAST BIOPSY     benign  . BREAST REDUCTION SURGERY    . COLONOSCOPY    .  HERNIA REPAIR    . POLYPECTOMY    . UPPER GASTROINTESTINAL ENDOSCOPY      Social History   Social History  . Marital status: Single    Spouse name: N/A  . Number of children: N/A  . Years of education: N/A   Social History Main Topics  . Smoking status: Never Smoker  . Smokeless tobacco: Never Used  . Alcohol use 0.0 oz/week     Comment: occasionally  . Drug use: No  . Sexual activity: Yes   Other Topics Concern  . Not on file   Social History Narrative   Partner for 2 years in 2013. Prior 20 year relationship-still friends. No children. 1 dog-peekapoo.       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       Hobbies: golf, cooking, gardening, poker    Family History  Problem Relation Age of Onset  . Coronary artery disease Father        age 18 - 37 stents, all 3 siblings with early cardiac disease  . Colon polyps Father   . Heart attack Father 46  . Cirrhosis Mother        alcohol related  . Ovarian cancer Maternal Aunt   . Stomach cancer Paternal Aunt   . Esophageal cancer Paternal Aunt   . Diabetes Paternal Grandmother        and aunt  . Colon polyps Sister   . Colon cancer Neg Hx   . Rectal cancer Neg Hx     Review of Systems  Constitutional: Negative for chills and fever.  Respiratory: Positive for cough (from dry throat/mouth).  Negative for shortness of breath and wheezing.   Cardiovascular: Positive for palpitations (occasional). Negative for chest pain and leg swelling.  Gastrointestinal: Negative for abdominal pain, blood in stool, constipation and diarrhea.  Genitourinary: Negative for dysuria and hematuria.  Musculoskeletal: Positive for arthralgias, back pain and myalgias.  Skin: Negative for color change.  Neurological: Positive for headaches (sinus, migraines). Negative for dizziness and light-headedness.  Psychiatric/Behavioral: Positive for dysphoric mood. The patient is nervous/anxious.        Objective:   Vitals:   06/20/17 0813  BP: (!) 144/96  Pulse: 88  Resp: 16  Temp: 97.8 F (36.6 C)  SpO2: 97%   Wt Readings from Last 3 Encounters:  06/20/17 175 lb (79.4 kg)  06/01/17 174 lb 12.8 oz (79.3 kg)  03/31/17 181 lb 6.4 oz (82.3 kg)   Body mass index is 29.12 kg/m.   Physical Exam    Constitutional: She appears well-developed and well-nourished. No distress.  HENT:  Head: Normocephalic and atraumatic.  Right Ear: External ear normal. Normal ear canal and TM Left Ear: External ear normal.  Normal ear canal and TM Mouth/Throat: Oropharynx is clear and dry .  Eyes: Conjunctivae  normal.  Neck: Neck supple. No tracheal deviation present. No thyromegaly present.  No carotid bruit  Cardiovascular: Normal rate, regular rhythm and normal heart sounds.   No murmur heard.  No edema. Pulmonary/Chest: Effort normal and breath sounds normal. No respiratory distress. She has no wheezes. She has no rales.  Breast: full exam deferred to Gyn; healing ulcer on lateral aspect of right breast w/o discharge Abdominal: Soft. She exhibits no distension. There is no tenderness.  Lymphadenopathy: She has no cervical adenopathy.  Skin: Skin is warm and dry. She is not diaphoretic.  Psychiatric: She has a normal mood and affect.  Her behavior is normal.       Assessment & Plan:    See Problem List for  Assessment and Plan of chronic medical problems.

## 2017-06-20 ENCOUNTER — Other Ambulatory Visit: Payer: BLUE CROSS/BLUE SHIELD

## 2017-06-20 ENCOUNTER — Encounter: Payer: Self-pay | Admitting: Internal Medicine

## 2017-06-20 ENCOUNTER — Encounter: Payer: Self-pay | Admitting: Family Medicine

## 2017-06-20 ENCOUNTER — Other Ambulatory Visit (INDEPENDENT_AMBULATORY_CARE_PROVIDER_SITE_OTHER): Payer: BLUE CROSS/BLUE SHIELD

## 2017-06-20 ENCOUNTER — Ambulatory Visit (INDEPENDENT_AMBULATORY_CARE_PROVIDER_SITE_OTHER): Payer: BLUE CROSS/BLUE SHIELD | Admitting: Internal Medicine

## 2017-06-20 VITALS — BP 144/96 | HR 88 | Temp 97.8°F | Resp 16 | Wt 175.0 lb

## 2017-06-20 DIAGNOSIS — C189 Malignant neoplasm of colon, unspecified: Secondary | ICD-10-CM

## 2017-06-20 DIAGNOSIS — Z1159 Encounter for screening for other viral diseases: Secondary | ICD-10-CM

## 2017-06-20 DIAGNOSIS — G43109 Migraine with aura, not intractable, without status migrainosus: Secondary | ICD-10-CM

## 2017-06-20 DIAGNOSIS — M3500 Sicca syndrome, unspecified: Secondary | ICD-10-CM

## 2017-06-20 DIAGNOSIS — E7849 Other hyperlipidemia: Secondary | ICD-10-CM | POA: Diagnosis not present

## 2017-06-20 DIAGNOSIS — K2100 Gastro-esophageal reflux disease with esophagitis, without bleeding: Secondary | ICD-10-CM

## 2017-06-20 DIAGNOSIS — I10 Essential (primary) hypertension: Secondary | ICD-10-CM

## 2017-06-20 DIAGNOSIS — F331 Major depressive disorder, recurrent, moderate: Secondary | ICD-10-CM | POA: Diagnosis not present

## 2017-06-20 DIAGNOSIS — F411 Generalized anxiety disorder: Secondary | ICD-10-CM

## 2017-06-20 DIAGNOSIS — E038 Other specified hypothyroidism: Secondary | ICD-10-CM

## 2017-06-20 DIAGNOSIS — K580 Irritable bowel syndrome with diarrhea: Secondary | ICD-10-CM | POA: Diagnosis not present

## 2017-06-20 DIAGNOSIS — Z23 Encounter for immunization: Secondary | ICD-10-CM

## 2017-06-20 LAB — COMPREHENSIVE METABOLIC PANEL
ALT: 30 U/L (ref 0–35)
AST: 30 U/L (ref 0–37)
Albumin: 4.2 g/dL (ref 3.5–5.2)
Alkaline Phosphatase: 83 U/L (ref 39–117)
BILIRUBIN TOTAL: 0.5 mg/dL (ref 0.2–1.2)
BUN: 12 mg/dL (ref 6–23)
CALCIUM: 9.6 mg/dL (ref 8.4–10.5)
CHLORIDE: 105 meq/L (ref 96–112)
CO2: 28 meq/L (ref 19–32)
Creatinine, Ser: 0.77 mg/dL (ref 0.40–1.20)
GFR: 82.58 mL/min (ref >60.00)
Glucose, Bld: 95 mg/dL (ref 70–99)
Potassium: 3.8 mEq/L (ref 3.5–5.1)
Sodium: 140 mEq/L (ref 135–145)
Total Protein: 7.8 g/dL (ref 6.0–8.3)

## 2017-06-20 LAB — LIPID PANEL
CHOL/HDL RATIO: 3
Cholesterol: 170 mg/dL (ref 0–200)
HDL: 49.9 mg/dL (ref >39.00)
LDL Cholesterol: 98 mg/dL (ref 0–99)
NonHDL: 120
TRIGLYCERIDES: 110 mg/dL (ref 0.0–149.0)
VLDL: 22 mg/dL (ref 0.0–40.0)

## 2017-06-20 LAB — CBC WITH DIFFERENTIAL/PLATELET
BASOS PCT: 0.5 % (ref 0.0–3.0)
Basophils Absolute: 0 10*3/uL (ref 0.0–0.1)
EOS PCT: 1.6 % (ref 0.0–5.0)
Eosinophils Absolute: 0.1 10*3/uL (ref 0.0–0.7)
HEMATOCRIT: 44.1 % (ref 36.0–46.0)
Hemoglobin: 14.7 g/dL (ref 12.0–15.0)
LYMPHS PCT: 46.4 % — AB (ref 12.0–46.0)
Lymphs Abs: 2 10*3/uL (ref 0.7–4.0)
MCHC: 33.3 g/dL (ref 30.0–36.0)
MCV: 94.3 fl (ref 78.0–100.0)
MONOS PCT: 8.3 % (ref 3.0–12.0)
Monocytes Absolute: 0.4 10*3/uL (ref 0.1–1.0)
NEUTROS ABS: 1.9 10*3/uL (ref 1.4–7.7)
Neutrophils Relative %: 43.2 % (ref 43.0–77.0)
PLATELETS: 278 10*3/uL (ref 150.0–400.0)
RBC: 4.67 Mil/uL (ref 3.87–5.11)
RDW: 13.7 % (ref 11.5–15.5)
WBC: 4.3 10*3/uL (ref 4.0–10.5)

## 2017-06-20 NOTE — Assessment & Plan Note (Addendum)
GERD controlled Continue as needed aciphex Discussed concerns with taking medication long term Stressed GERD diet Continue weight loss efforts

## 2017-06-20 NOTE — Patient Instructions (Addendum)
  Test(s) ordered today. Your results will be released to [facility] (or called to you) after review, usually within 72hours after test completion. If any changes need to be made, you will be notified at that same time.  All other Health Maintenance issues reviewed.   All recommended immunizations and age-appropriate screenings are up-to-date or discussed.  Flu and tetanus immunizations administered today.   Medications reviewed and updated.   No changes recommended at this time.     Please followup in 6 months

## 2017-06-20 NOTE — Assessment & Plan Note (Signed)
Check lipid panel  Continue daily statin Regular exercise and healthy diet encouraged  

## 2017-06-20 NOTE — Assessment & Plan Note (Signed)
BP well controlled Current regimen effective and well tolerated Continue current medications at current doses cmp Will start regular exercise Continue weight loss efforts Monitor BP at home

## 2017-06-20 NOTE — Assessment & Plan Note (Signed)
following with psychiatry  -  Management per them

## 2017-06-20 NOTE — Assessment & Plan Note (Addendum)
Taking hyoscyamine twice daily Lomotil prn

## 2017-06-20 NOTE — Assessment & Plan Note (Signed)
Rarely takes medication Takes fiorinal as needed, imitrex with migraines

## 2017-06-20 NOTE — Assessment & Plan Note (Signed)
Colonoscopy up to date

## 2017-06-20 NOTE — Assessment & Plan Note (Addendum)
following with psychiatry   Taking clonazepam and buspar

## 2017-06-20 NOTE — Assessment & Plan Note (Addendum)
Taking cevimeline No longer on methotrexate, did not tolerate plaquenil Diffuse Sicca syndrome

## 2017-06-20 NOTE — Assessment & Plan Note (Signed)
Managed by Dr Ellison 

## 2017-06-21 LAB — HEPATITIS C ANTIBODY
Hepatitis C Ab: NONREACTIVE
SIGNAL TO CUT-OFF: 0.02 (ref <1.00)

## 2017-06-22 ENCOUNTER — Encounter: Payer: Self-pay | Admitting: Internal Medicine

## 2017-06-23 ENCOUNTER — Ambulatory Visit (INDEPENDENT_AMBULATORY_CARE_PROVIDER_SITE_OTHER): Payer: BLUE CROSS/BLUE SHIELD | Admitting: Orthopaedic Surgery

## 2017-06-23 ENCOUNTER — Ambulatory Visit (INDEPENDENT_AMBULATORY_CARE_PROVIDER_SITE_OTHER): Payer: BLUE CROSS/BLUE SHIELD

## 2017-06-23 ENCOUNTER — Encounter (INDEPENDENT_AMBULATORY_CARE_PROVIDER_SITE_OTHER): Payer: Self-pay | Admitting: Orthopaedic Surgery

## 2017-06-23 DIAGNOSIS — G8929 Other chronic pain: Secondary | ICD-10-CM | POA: Diagnosis not present

## 2017-06-23 DIAGNOSIS — M545 Low back pain: Secondary | ICD-10-CM

## 2017-06-23 MED ORDER — BACLOFEN 10 MG PO TABS: 10.0000 mg | ORAL_TABLET | Freq: Three times a day (TID) | ORAL | 2 refills | Status: DC | PRN

## 2017-06-23 MED ORDER — DICLOFENAC SODIUM 75 MG PO TBEC: 75.0000 mg | DELAYED_RELEASE_TABLET | Freq: Two times a day (BID) | ORAL | 2 refills | Status: DC

## 2017-06-23 NOTE — Progress Notes (Signed)
Office Visit Note   Patient: Margaret Green           Date of Birth: 1960/12/12           MRN: [id number] Visit Date: 06/23/2017              Requested by: Margaret Olp, MD Margaret Green, [place] [address] PCP: Margaret Rail, MD   Assessment & Plan: Visit Diagnoses:  1. Chronic bilateral low back pain, with sciatica presence unspecified     Plan: Patient has lumbar spondylosis. We discussed the x-rays today. Prescription for diclofenac and baclofen. Physical therapy with modalities. She uses an Scientist, product/process development at work. Questions encouraged and answered. If not better after 6 weeks of physical therapy she should follow up and we would likely obtain MRI at that time to rule out structural abnormalities  Follow-Up Instructions: Return if symptoms worsen or fail to improve.   Orders:  Orders Placed This Encounter  Procedures  . XR Lumbar Spine 2-3 Views   Meds ordered this encounter  Medications  . diclofenac (VOLTAREN) 75 MG EC tablet    Sig: Take 1 tablet (75 mg total) by mouth 2 (two) times daily.    Dispense:  30 tablet    Refill:  2  . baclofen (LIORESAL) 10 MG tablet    Sig: Take 1 tablet (10 mg total) by mouth 3 (three) times daily as needed for muscle spasms.    Dispense:  30 each    Refill:  2      Procedures: No procedures performed   Clinical Data: No additional findings.   Subjective: Chief Complaint  Patient presents with  . Lower Back - Pain    Patient is a very pleasant 56 year old female who comes in with 6 week history of low back pain without radiation or numbness and tingling. Denies any injuries. She's had this happen before but has not lasted 6 weeks in the past. She has been to 3 separate chiropractors without any relief. She has significant locking and muscle spasms in her back. She also has significant stiffness. She takes tizanidine at night. She takes ibuprofen as needed. She denies any bowel or bladder  dysfunction.    Review of Systems  Constitutional: Negative.   HENT: Negative.   Eyes: Negative.   Respiratory: Negative.   Cardiovascular: Negative.   Endocrine: Negative.   Musculoskeletal: Negative.   Neurological: Negative.   Hematological: Negative.   Psychiatric/Behavioral: Negative.   All other systems reviewed and are negative.    Objective: Vital Signs: There were no vitals taken for this visit.  Physical Exam  Constitutional: She is oriented to person, place, and time. She appears well-developed and well-nourished.  HENT:  Head: Normocephalic and atraumatic.  Eyes: EOM are normal.  Neck: Neck supple.  Pulmonary/Chest: Effort normal.  Abdominal: Soft.  Neurological: She is alert and oriented to person, place, and time.  Skin: Skin is warm. Capillary refill takes less than 2 seconds.  Psychiatric: She has a normal mood and affect. Her behavior is normal. Judgment and thought content normal.  Nursing note and vitals reviewed.   Ortho Exam Low back exam shows tenderness along the lumbar spinous processes and the paraspinal muscles. There is no signs of radiculopathy. No focal motor or sensory deficits. Specialty Comments:  No specialty comments available.  Imaging: Xr Lumbar Spine 2-3 Views  Result Date: 06/23/2017 Lumbar spondylosis at multiple levels and mild degenerative disc disease    PMFS  History: Patient Active Problem List   Diagnosis Date Noted  . Multinodular goiter 03/28/2017  . Chest pain with low risk for cardiac etiology 01/02/2017  . Dyspnea on exertion 01/02/2017  . Tachycardia with hypertension 01/02/2017  . Sjogren's disease ([place]) 08/27/2015  . Depression 07/23/2015  . Adenocarcinoma, colon ([place]) 07/23/2015  . Hoarseness 03/10/2015  . Migraine 08/14/2014  . Allergic rhinitis 08/14/2014  . IBS (irritable bowel syndrome) 08/14/2014  . Essential hypertension, benign 08/14/2014  . GERD (gastroesophageal reflux disease) 08/14/2014  .  Right thyroid nodule 09/24/2013  . GAD (generalized anxiety disorder) 09/29/2010  . ANEMIA, PERNICIOUS 02/24/2009  . SPRAIN/STRAIN, LUMBAR REGION 04/16/2007  . Hyperlipidemia 04/13/2007  . RAYNAUD'S DISEASE 04/13/2007  . Osteoarthritis 04/13/2007  . Fibromyalgia 04/13/2007  . Hypothyroidism 04/03/2007   Past Medical History:  Diagnosis Date  . Adenocarcinoma, colon ([place]) 07/23/2015   Removed completely by polypectomy 06/2015   . Allergy   . Anxiety   . Arthritis   . Endometriosis 08/19/2009   Hysterectomy.     . Family history of diabetes mellitus   . GERD (gastroesophageal reflux disease)   . Headache(784.0)   . Heart murmur    past hx of heart murmur  . Hyperlipidemia   . Hypothyroidism   . Neutropenia   . Raynaud's syndrome   . Sjogrens syndrome ([place])   . Stricture and stenosis of esophagus     Family History  Problem Relation Age of Onset  . Coronary artery disease Father        age 55 - 53 stents, all 3 siblings with early cardiac disease  . Colon polyps Father   . Heart attack Father 5  . Heart disease Father   . Cirrhosis Mother        alcohol related  . Alcohol abuse Mother   . Ovarian cancer Maternal Aunt   . Stomach cancer Paternal Aunt   . Esophageal cancer Paternal Aunt   . Diabetes Paternal Grandmother        and aunt  . Colon polyps Sister   . Heart attack Paternal Uncle   . Colon cancer Neg Hx   . Rectal cancer Neg Hx     Past Surgical History:  Procedure Laterality Date  . ABDOMINAL HYSTERECTOMY    . BREAST BIOPSY     benign  . BREAST REDUCTION SURGERY    . COLONOSCOPY    . HERNIA REPAIR    . POLYPECTOMY    . UPPER GASTROINTESTINAL ENDOSCOPY     Social History   Occupational History  . Not on file.   Social History Main Topics  . Smoking status: Never Smoker  . Smokeless tobacco: Never Used  . Alcohol use 0.0 oz/week     Comment: occasionally  . Drug use: No  . Sexual activity: Yes

## 2017-06-23 NOTE — Addendum Note (Signed)
Addended by: Precious Bard on: 06/23/2017 10:33 AM   Modules accepted: Orders

## 2017-06-26 ENCOUNTER — Other Ambulatory Visit: Payer: BLUE CROSS/BLUE SHIELD

## 2017-07-10 ENCOUNTER — Other Ambulatory Visit: Payer: BLUE CROSS/BLUE SHIELD

## 2017-08-08 ENCOUNTER — Ambulatory Visit
Admission: RE | Admit: 2017-08-08 | Discharge: 2017-08-08 | Disposition: A | Discharge status: Home / Self Care | Payer: BLUE CROSS/BLUE SHIELD | Source: Ambulatory Visit | Attending: Family Medicine | Admitting: Family Medicine

## 2017-08-08 DIAGNOSIS — N644 Mastodynia: Secondary | ICD-10-CM

## 2017-08-08 DIAGNOSIS — S20121A Blister (nonthermal) of breast, right breast, initial encounter: Secondary | ICD-10-CM

## 2017-08-08 DIAGNOSIS — M8580 Other specified disorders of bone density and structure, unspecified site: Secondary | ICD-10-CM

## 2017-08-10 ENCOUNTER — Encounter: Payer: Self-pay | Admitting: Internal Medicine

## 2017-08-10 DIAGNOSIS — M810 Age-related osteoporosis without current pathological fracture: Secondary | ICD-10-CM | POA: Insufficient documentation

## 2017-08-10 DIAGNOSIS — M8580 Other specified disorders of bone density and structure, unspecified site: Secondary | ICD-10-CM | POA: Insufficient documentation

## 2017-09-07 ENCOUNTER — Other Ambulatory Visit: Payer: Self-pay | Admitting: Family Medicine

## 2017-09-07 ENCOUNTER — Telehealth: Payer: Self-pay | Admitting: Internal Medicine

## 2017-09-07 NOTE — Telephone Encounter (Signed)
ok 

## 2017-09-07 NOTE — Telephone Encounter (Signed)
Notified pharmacy spoke w/April (pharmacist) gave MD response. Will be using [employer] manufacturer.Marland KitchenJohny Chess

## 2017-09-07 NOTE — Telephone Encounter (Signed)
Per chart rx Levothyroxine was refilled, but the manufacturer has change needing ok to change manufacturer.Marland KitchenJohny Chess

## 2017-09-07 NOTE — Telephone Encounter (Signed)
Eddie from Pharmacy called in to be advised on pt's Rx. They received refill however manufacturer has changed, they would like the okay to change brand?    Please advise.    CB: [phone]

## 2017-10-02 ENCOUNTER — Encounter: Payer: Self-pay | Admitting: Internal Medicine

## 2017-10-02 NOTE — Telephone Encounter (Signed)
I will write the letter if you are okay with it

## 2017-10-03 ENCOUNTER — Encounter: Payer: Self-pay | Admitting: Emergency Medicine

## 2017-10-03 NOTE — Telephone Encounter (Signed)
Ok to write letter - ok to take MMR

## 2017-11-08 ENCOUNTER — Emergency Department (HOSPITAL_COMMUNITY)
Admission: EM | Admit: 2017-11-08 | Discharge: 2017-11-08 | Disposition: A | Discharge status: Home / Self Care | Payer: Self-pay | Attending: Emergency Medicine | Admitting: Emergency Medicine

## 2017-11-08 ENCOUNTER — Encounter (HOSPITAL_COMMUNITY): Payer: Self-pay

## 2017-11-08 DIAGNOSIS — E039 Hypothyroidism, unspecified: Secondary | ICD-10-CM | POA: Insufficient documentation

## 2017-11-08 DIAGNOSIS — R197 Diarrhea, unspecified: Secondary | ICD-10-CM | POA: Insufficient documentation

## 2017-11-08 DIAGNOSIS — E785 Hyperlipidemia, unspecified: Secondary | ICD-10-CM | POA: Insufficient documentation

## 2017-11-08 DIAGNOSIS — R112 Nausea with vomiting, unspecified: Secondary | ICD-10-CM | POA: Insufficient documentation

## 2017-11-08 DIAGNOSIS — R630 Anorexia: Secondary | ICD-10-CM | POA: Insufficient documentation

## 2017-11-08 DIAGNOSIS — Z79899 Other long term (current) drug therapy: Secondary | ICD-10-CM | POA: Insufficient documentation

## 2017-11-08 LAB — COMPREHENSIVE METABOLIC PANEL
ALBUMIN: 3.6 g/dL (ref 3.5–5.0)
ALT: 14 U/L (ref 14–54)
AST: 21 U/L (ref 15–41)
Alkaline Phosphatase: 72 U/L (ref 38–126)
Anion gap: 12 (ref 5–15)
BUN: 5 mg/dL — ABNORMAL LOW (ref 6–20)
CHLORIDE: 100 mmol/L — AB (ref 101–111)
CO2: 25 mmol/L (ref 22–32)
CREATININE: 0.93 mg/dL (ref 0.44–1.00)
Calcium: 9 mg/dL (ref 8.9–10.3)
GFR calc Af Amer: >60 mL/min (ref >60)
GFR calc non Af Amer: >60 mL/min (ref >60)
Glucose, Bld: 111 mg/dL — ABNORMAL HIGH (ref 65–99)
Potassium: 2.5 mmol/L — CL (ref 3.5–5.1)
Sodium: 137 mmol/L (ref 135–145)
Total Bilirubin: 0.8 mg/dL (ref 0.3–1.2)
Total Protein: 7.4 g/dL (ref 6.5–8.1)

## 2017-11-08 LAB — URINALYSIS, ROUTINE W REFLEX MICROSCOPIC
BILIRUBIN URINE: NEGATIVE
Glucose, UA: NEGATIVE mg/dL
Ketones, ur: NEGATIVE mg/dL
Leukocytes, UA: NEGATIVE
NITRITE: NEGATIVE
PH: 7 (ref 5.0–8.0)
Protein, ur: NEGATIVE mg/dL
SPECIFIC GRAVITY, URINE: 1.003 — AB (ref 1.005–1.030)

## 2017-11-08 LAB — CBC WITH DIFFERENTIAL/PLATELET
Basophils Absolute: 0 10*3/uL (ref 0.0–0.1)
Basophils Relative: 0 %
Eosinophils Absolute: 0 10*3/uL (ref 0.0–0.7)
Eosinophils Relative: 0 %
HCT: 42.1 % (ref 36.0–46.0)
Hemoglobin: 14.6 g/dL (ref 12.0–15.0)
Lymphocytes Relative: 20 %
Lymphs Abs: 1.1 10*3/uL (ref 0.7–4.0)
MCH: 31.4 pg (ref 26.0–34.0)
MCHC: 34.7 g/dL (ref 30.0–36.0)
MCV: 90.5 fL (ref 78.0–100.0)
Monocytes Absolute: 0.4 10*3/uL (ref 0.1–1.0)
Monocytes Relative: 8 %
Neutro Abs: 3.8 10*3/uL (ref 1.7–7.7)
Neutrophils Relative %: 72 %
Platelets: 247 10*3/uL (ref 150–400)
RBC: 4.65 MIL/uL (ref 3.87–5.11)
RDW: 12.9 % (ref 11.5–15.5)
WBC: 5.2 10*3/uL (ref 4.0–10.5)

## 2017-11-08 LAB — RAPID URINE DRUG SCREEN, HOSP PERFORMED
Amphetamines: NOT DETECTED
Barbiturates: NOT DETECTED
Benzodiazepines: NOT DETECTED
Cocaine: NOT DETECTED
Opiates: NOT DETECTED
Tetrahydrocannabinol: NOT DETECTED

## 2017-11-08 LAB — ETHANOL: Alcohol, Ethyl (B): <10 mg/dL (ref <10)

## 2017-11-08 LAB — MAGNESIUM: Magnesium: 1.2 mg/dL — ABNORMAL LOW (ref 1.7–2.4)

## 2017-11-08 LAB — LIPASE, BLOOD: Lipase: 21 U/L (ref 11–51)

## 2017-11-08 MED ORDER — SODIUM CHLORIDE 0.9 % IV BOLUS (SEPSIS)
1000.0000 mL | Freq: Once | INTRAVENOUS | Status: AC
  Administered 2017-11-08: 1000 mL via INTRAVENOUS

## 2017-11-08 MED ORDER — MAGNESIUM SULFATE 50 % IJ SOLN: 2.0000 g | Freq: Once | INTRAMUSCULAR | Status: DC

## 2017-11-08 MED ORDER — ONDANSETRON HCL 4 MG/2ML IJ SOLN
4.0000 mg | Freq: Once | INTRAMUSCULAR | Status: AC
  Administered 2017-11-08: 4 mg via INTRAVENOUS
  Filled 2017-11-08: qty 2

## 2017-11-08 MED ORDER — POTASSIUM CHLORIDE CRYS ER 20 MEQ PO TBCR
40.0000 meq | EXTENDED_RELEASE_TABLET | Freq: Once | ORAL | Status: AC
  Administered 2017-11-08: 40 meq via ORAL
  Filled 2017-11-08: qty 2

## 2017-11-08 MED ORDER — ACETAMINOPHEN 325 MG PO TABS
650.0000 mg | ORAL_TABLET | Freq: Once | ORAL | Status: AC
  Administered 2017-11-08: 650 mg via ORAL
  Filled 2017-11-08: qty 2

## 2017-11-08 MED ORDER — LORAZEPAM 0.5 MG PO TABS: 0.5000 mg | ORAL_TABLET | Freq: Three times a day (TID) | ORAL | 0 refills | Status: DC | PRN

## 2017-11-08 MED ORDER — ESCITALOPRAM OXALATE 10 MG PO TABS: 10.0000 mg | ORAL_TABLET | Freq: Every day | ORAL | 0 refills | Status: DC

## 2017-11-08 MED ORDER — POTASSIUM CHLORIDE CRYS ER 20 MEQ PO TBCR: 20.0000 meq | EXTENDED_RELEASE_TABLET | Freq: Every day | ORAL | 0 refills | Status: DC

## 2017-11-08 MED ORDER — ONDANSETRON 4 MG PO TBDP: 4.0000 mg | ORAL_TABLET | Freq: Three times a day (TID) | ORAL | 0 refills | Status: DC | PRN

## 2017-11-08 MED ORDER — LORAZEPAM 1 MG PO TABS
1.0000 mg | ORAL_TABLET | Freq: Once | ORAL | Status: AC
  Administered 2017-11-08: 1 mg via ORAL
  Filled 2017-11-08: qty 1

## 2017-11-08 MED ORDER — MAGNESIUM SULFATE 2 GM/50ML IV SOLN
2.0000 g | Freq: Once | INTRAVENOUS | Status: AC
Start: 2017-11-08 — End: 2017-11-08
  Administered 2017-11-08: 2 g via INTRAVENOUS
  Filled 2017-11-08: qty 50

## 2017-11-08 MED ORDER — LORAZEPAM 2 MG/ML IJ SOLN
1.0000 mg | Freq: Once | INTRAMUSCULAR | Status: AC
  Administered 2017-11-08: 1 mg via INTRAVENOUS
  Filled 2017-11-08: qty 1

## 2017-11-08 MED ORDER — POTASSIUM CHLORIDE 10 MEQ/100ML IV SOLN
10.0000 meq | Freq: Once | INTRAVENOUS | Status: AC
  Administered 2017-11-08: 10 meq via INTRAVENOUS
  Filled 2017-11-08: qty 100

## 2017-11-08 NOTE — ED Triage Notes (Signed)
Pt complains of being dehydrated and not being able to keep anything down, she also states that she's had diarrhea Pt says that she's been going through a depression

## 2017-11-08 NOTE — ED Notes (Signed)
Patient aware urine sample is needed. States she will try once she gets some fluids in her.

## 2017-11-08 NOTE — ED Provider Notes (Signed)
[facility] DEPT Provider Note   CSN: [id number] Arrival date & time: 11/08/17  0551     History   Chief Complaint Chief Complaint  Patient presents with  . Dehydration  . Depression    HPI Margaret Green is a 57 y.o. female with a history of GERD, hyperlipidemia, hypothyroidism, endometriosis, Sjogren's syndrome, and anxiety who presents the emergency department with multiple complaints over the past 3 weeks.  Patient states that she has been battling depression and anxiety intermittently for the past 1 year, significantly worsened over the past few weeks with loss of job, states she needs help.  Has not had thoughts of SI/HI or hallucinations.  States she has had decreased PO intake-attributes this to both depression as well as her intermittent nausea/vomiting/diarrhea- relays when she does attempt to eat her she feels the food tastes thick and when she swallows she will also vomit the majority of times. Has hx of Sjogren's previously on MTX- was stopped due to requiring vaccines for new job, plans for resumption in 3 weeks.  Patient's emesis and diarrhea are both nonbloody.  .  States her stomach is "hungry", no abdominal pain.  Denies fever, dyspnea, chest pain, dysuria, urinary frequency/urgency, vaginal bleeding, or vaginal discharge.   HPI  Past Medical History:  Diagnosis Date  . Adenocarcinoma, colon ([place]) 07/23/2015   Removed completely by polypectomy 06/2015   . Allergy   . Anxiety   . Arthritis   . Endometriosis 08/19/2009   Hysterectomy.     . Family history of diabetes mellitus   . GERD (gastroesophageal reflux disease)   . Headache(784.0)   . Heart murmur    past hx of heart murmur  . Hyperlipidemia   . Hypothyroidism   . Neutropenia   . Raynaud's syndrome   . Sjogrens syndrome ([place])   . Stricture and stenosis of esophagus     Patient Active Problem List   Diagnosis Date Noted  . Osteopenia 08/10/2017  . Multinodular  goiter 03/28/2017  . Chest pain with low risk for cardiac etiology 01/02/2017  . Dyspnea on exertion 01/02/2017  . Tachycardia with hypertension 01/02/2017  . Sjogren's disease ([place]) 08/27/2015  . Depression 07/23/2015  . Adenocarcinoma, colon ([place]) 07/23/2015  . Hoarseness 03/10/2015  . Migraine 08/14/2014  . Allergic rhinitis 08/14/2014  . IBS (irritable bowel syndrome) 08/14/2014  . Essential hypertension, benign 08/14/2014  . GERD (gastroesophageal reflux disease) 08/14/2014  . Right thyroid nodule 09/24/2013  . GAD (generalized anxiety disorder) 09/29/2010  . ANEMIA, PERNICIOUS 02/24/2009  . SPRAIN/STRAIN, LUMBAR REGION 04/16/2007  . Hyperlipidemia 04/13/2007  . RAYNAUD'S DISEASE 04/13/2007  . Osteoarthritis 04/13/2007  . Fibromyalgia 04/13/2007  . Hypothyroidism 04/03/2007    Past Surgical History:  Procedure Laterality Date  . ABDOMINAL HYSTERECTOMY    . BREAST BIOPSY     benign  . BREAST REDUCTION SURGERY    . COLONOSCOPY    . HERNIA REPAIR    . POLYPECTOMY    . REDUCTION MAMMAPLASTY    . UPPER GASTROINTESTINAL ENDOSCOPY      OB History    No data available       Home Medications    Prior to Admission medications   Medication Sig Start Date End Date Taking? Authorizing Provider  Calcium Carbonate-Vitamin D (CALCIUM 600+D) 600-400 MG-UNIT tablet Take 1 tablet by mouth daily.   Yes [provider]  estradiol (VIVELLE-DOT) 0.05 MG/24HR patch Place 1 patch onto the skin 2 (two) times a week. Pt  places on Wednesday and Sunday.   Yes [provider]  levothyroxine (SYNTHROID, LEVOTHROID) 75 MCG tablet TAKE ONE TABLET BY MOUTH ONCE DAILY 09/07/17  Yes Marin Olp, MD  losartan (COZAAR) 50 MG tablet Take 1 tablet (50 mg total) by mouth daily. 04/19/17  Yes Marin Olp, MD  rosuvastatin (CRESTOR) 20 MG tablet Take 1 tablet (20 mg total) by mouth daily. 06/08/17  Yes Marin Olp, MD  tiZANidine (ZANAFLEX) 4 MG tablet TAKE ONE TABLET  BY MOUTH TWICE DAILY AS NEEDED Patient taking differently: TAKE ONE TABLET BY MOUTH TWICE DAILY AS NEEDED MUSCLE SPASM 05/25/17  Yes Marin Olp, MD  baclofen (LIORESAL) 10 MG tablet Take 1 tablet (10 mg total) by mouth 3 (three) times daily as needed for muscle spasms. Patient not taking: Reported on 11/08/2017 06/23/17   Leandrew Koyanagi, MD  busPIRone (BUSPAR) 5 MG tablet Take 1 tablet (5 mg total) by mouth 2 (two) times daily. Patient not taking: Reported on 11/08/2017 04/17/17   Marin Olp, MD  butalbital-aspirin-caffeine [facility]) [id number] MG capsule take 1 capsule by mouth every 4 hours if needed Patient not taking: Reported on 11/08/2017 04/29/16   Marin Olp, MD  clonazePAM (KLONOPIN) 0.5 MG tablet Take 1 tablet (0.5 mg total) by mouth 2 (two) times daily as needed for anxiety. Patient not taking: Reported on 11/08/2017 03/31/17   Marin Olp, MD  diclofenac (VOLTAREN) 75 MG EC tablet Take 1 tablet (75 mg total) by mouth 2 (two) times daily. Patient not taking: Reported on 11/08/2017 06/23/17   Leandrew Koyanagi, MD  diphenoxylate-atropine (LOMOTIL) 2.5-0.025 MG tablet Take 1 tablet by mouth 4 (four) times daily. Patient not taking: Reported on 11/08/2017 04/04/17   Marin Olp, MD  hyoscyamine (LEVBID) 0.375 MG 12 hr tablet TAKE 1 TABLET BY MOUTH TWICE DAILY Patient not taking: Reported on 11/08/2017 04/19/17   Marin Olp, MD  RABEprazole (ACIPHEX) 20 MG tablet Take 2 tablets (40 mg total) by mouth daily. Patient not taking: Reported on 11/08/2017 08/26/16   Yetta Flock, MD  zolpidem (AMBIEN) 5 MG tablet Take 1 tablet (5 mg total) by mouth at bedtime as needed. for sleep Patient not taking: Reported on 11/08/2017 03/03/17   Marin Olp, MD    Family History Family History  Problem Relation Age of Onset  . Coronary artery disease Father        age 66 - 98 stents, all 3 siblings with early cardiac disease  . Colon polyps Father   . Heart attack Father  43  . Heart disease Father   . Cirrhosis Mother        alcohol related  . Alcohol abuse Mother   . Ovarian cancer Maternal Aunt   . Stomach cancer Paternal Aunt   . Esophageal cancer Paternal Aunt   . Diabetes Paternal Grandmother        and aunt  . Colon polyps Sister   . Heart attack Paternal Uncle   . Colon cancer Neg Hx   . Rectal cancer Neg Hx     Social History Social History   Tobacco Use  . Smoking status: Never Smoker  . Smokeless tobacco: Never Used  Substance Use Topics  . Alcohol use: Yes    Alcohol/week: 0.0 oz    Comment: occasionally  . Drug use: No     Allergies   Abilify [aripiprazole]; Codeine; Omeprazole; Meloxicam; Oxycodone-aspirin; and Sulfamethoxazole   Review of Systems Review  of Systems  Constitutional: Negative for fever.  HENT: Positive for congestion.   Respiratory: Negative for shortness of breath.   Cardiovascular: Negative for chest pain.  Gastrointestinal: Positive for diarrhea, nausea and vomiting. Negative for abdominal pain, blood in stool and constipation.  Genitourinary: Negative for dysuria, hematuria, vaginal bleeding and vaginal discharge.  Psychiatric/Behavioral: Negative for hallucinations and suicidal ideas. The patient is nervous/anxious.        Positive for depression. Negative for HI.  All other systems reviewed and are negative.   Physical Exam Updated Vital Signs BP (!) 153/97 (BP Location: Left Arm)   Pulse 64   Temp 98.7 F (37.1 C) (Oral)   Resp 18   SpO2 98%   Physical Exam  Constitutional: She appears well-developed and well-nourished. No distress.  HENT:  Head: Normocephalic and atraumatic.  Eyes: Conjunctivae are normal. Right eye exhibits no discharge. Left eye exhibits no discharge.  Cardiovascular: Normal rate and regular rhythm.  No murmur heard. Pulmonary/Chest: Breath sounds normal. No respiratory distress. She has no wheezes. She has no rales.  Abdominal: Soft. She exhibits no distension.  There is no tenderness. There is no rigidity, no rebound, no guarding, no CVA tenderness and no tenderness at McBurney's point.  Neurological: She is alert.  Clear speech.   Skin: Skin is warm and dry. No rash noted.  Psychiatric: Her speech is normal and behavior is normal. Her mood appears anxious. She exhibits a depressed mood. She expresses no homicidal and no suicidal ideation. She expresses no suicidal plans and no homicidal plans.  Nursing note and vitals reviewed.  ED Treatments / Results  Labs Results for orders placed or performed during the hospital encounter of 11/08/17  Ethanol  Result Value Ref Range   Alcohol, Ethyl (B) <10 <10 mg/dL  CBC with Differential  Result Value Ref Range   WBC 5.2 4.0 - 10.5 K/uL   RBC 4.65 3.87 - 5.11 MIL/uL   Hemoglobin 14.6 12.0 - 15.0 g/dL   HCT 42.1 36.0 - 46.0 %   MCV 90.5 78.0 - 100.0 fL   MCH 31.4 26.0 - 34.0 pg   MCHC 34.7 30.0 - 36.0 g/dL   RDW 12.9 11.5 - 15.5 %   Platelets 247 150 - 400 K/uL   Neutrophils Relative % 72 %   Neutro Abs 3.8 1.7 - 7.7 K/uL   Lymphocytes Relative 20 %   Lymphs Abs 1.1 0.7 - 4.0 K/uL   Monocytes Relative 8 %   Monocytes Absolute 0.4 0.1 - 1.0 K/uL   Eosinophils Relative 0 %   Eosinophils Absolute 0.0 0.0 - 0.7 K/uL   Basophils Relative 0 %   Basophils Absolute 0.0 0.0 - 0.1 K/uL  Comprehensive metabolic panel  Result Value Ref Range   Sodium 137 135 - 145 mmol/L   Potassium 2.5 (LL) 3.5 - 5.1 mmol/L   Chloride 100 (L) 101 - 111 mmol/L   CO2 25 22 - 32 mmol/L   Glucose, Bld 111 (H) 65 - 99 mg/dL   BUN 5 (L) 6 - 20 mg/dL   Creatinine, Ser 0.93 0.44 - 1.00 mg/dL   Calcium 9.0 8.9 - 10.3 mg/dL   Total Protein 7.4 6.5 - 8.1 g/dL   Albumin 3.6 3.5 - 5.0 g/dL   AST 21 15 - 41 U/L   ALT 14 14 - 54 U/L   Alkaline Phosphatase 72 38 - 126 U/L   Total Bilirubin 0.8 0.3 - 1.2 mg/dL   GFR calc non Af  Amer >60 >60 mL/min   GFR calc Af Amer >60 >60 mL/min   Anion gap 12 5 - 15  Lipase, blood  Result  Value Ref Range   Lipase 21 11 - 51 U/L  Urine rapid drug screen (hosp performed)  Result Value Ref Range   Opiates NONE DETECTED NONE DETECTED   Cocaine NONE DETECTED NONE DETECTED   Benzodiazepines NONE DETECTED NONE DETECTED   Amphetamines NONE DETECTED NONE DETECTED   Tetrahydrocannabinol NONE DETECTED NONE DETECTED   Barbiturates NONE DETECTED NONE DETECTED  Urinalysis, Routine w reflex microscopic  Result Value Ref Range   Color, Urine YELLOW YELLOW   APPearance CLEAR CLEAR   Specific Gravity, Urine 1.003 (L) 1.005 - 1.030   pH 7.0 5.0 - 8.0   Glucose, UA NEGATIVE NEGATIVE mg/dL   Hgb urine dipstick SMALL (A) NEGATIVE   Bilirubin Urine NEGATIVE NEGATIVE   Ketones, ur NEGATIVE NEGATIVE mg/dL   Protein, ur NEGATIVE NEGATIVE mg/dL   Nitrite NEGATIVE NEGATIVE   Leukocytes, UA NEGATIVE NEGATIVE   RBC / HPF 0-5 0 - 5 RBC/hpf   WBC, UA 0-5 0 - 5 WBC/hpf   Bacteria, UA RARE (A) NONE SEEN   Squamous Epithelial / LPF 0-5 (A) NONE SEEN  Magnesium  Result Value Ref Range   Magnesium 1.2 (L) 1.7 - 2.4 mg/dL   No results found. EKG  EKG Interpretation  Date/Time:  Wednesday November 08 2017 08:37:48 EST Ventricular Rate:  66 PR Interval:    QRS Duration: 83 QT Interval:  437 QTC Calculation: 458 R Axis:   10 Text Interpretation:  Sinus rhythm Short PR interval No acute changes No significant change since last tracing Confirmed by Varney Biles [phone]) on 11/08/2017 11:49:50 AM       Radiology No results found.  Procedures Procedures (including critical care time)  Medications Ordered in ED Medications  sodium chloride 0.9 % bolus 1,000 mL (0 mLs Intravenous Stopped 11/08/17 1014)  ondansetron (ZOFRAN) injection 4 mg (4 mg Intravenous Given 11/08/17 0708)  potassium chloride 10 mEq in 100 mL IVPB (0 mEq Intravenous Stopped 11/08/17 1014)  potassium chloride SA (K-DUR,KLOR-CON) CR tablet 40 mEq (40 mEq Oral Given 11/08/17 1022)  LORazepam (ATIVAN) injection 1 mg (1 mg  Intravenous Given 11/08/17 0839)  magnesium sulfate IVPB 2 g 50 mL (0 g Intravenous Stopped 11/08/17 1122)  acetaminophen (TYLENOL) tablet 650 mg (650 mg Oral Given 11/08/17 1039)  potassium chloride SA (K-DUR,KLOR-CON) CR tablet 40 mEq (40 mEq Oral Given 11/08/17 1340)  LORazepam (ATIVAN) tablet 1 mg (1 mg Oral Given 11/08/17 1408)    Initial Impression / Assessment and Plan / ED Course  I have reviewed the triage vital signs and the nursing notes.  Pertinent labs & imaging results that were available during my care of the patient were reviewed by me and considered in my medical decision making (see chart for details).   Patient presents with nausea, vomiting, diarrhea, decreased appetite, and depression.  Patient is nontoxic-appearing, in no apparent distress, vitals within normal limits other than elevated BP-no indication of HTN emergency, patient aware of need for recheck.  Patient has a completely benign abdominal exam.  Will evaluate with screening lab work.   Will Initiate treatment with fluids and Zofran.  Consulted TTS placed due to depression with decreased PO intake.   08:06: Potassium level of 2.5- EKG ordered, 10 mEq IV ordered with 40 mEq PO pending patient is able to tolerate PO with subsequent 40 mEq PO for  additional dose.   08:12: RE-EVAL:  Patient states she is feeling very anxious and remains nauseous. Discussed results with patient thus far. Will order Ativan, hold off on PO potassium due to persistent nausea at this time. Discussed with Dr. Kathrynn Humble who recommended obtaining magnesium level- this has been ordered.   10:20: CONSULT: Discussed case with Skiler Tye from behavioral health, patient does not require psychiatric admission.   11:30: Magnesium 1.2- will replace with 2 g IV over an hour infusion. EKG without significant change from previous. Remaining lab work grossly unremarkable. Of note there is no leukocytosis or anemia. Lipase WNL. No other significant electrolyte  abnormalities other than the potassium and magnesium which have been replaced in the ED. No signs of end organ damage. BUN/creatinine WNL, LFTs WNL. Patient tolerating PO in the ED thus far and has not had any episodes of vomiting will initiate first dose of PO potassium.  12:20 RE-EVAL: Patient and her friend express concern for patient being discharged home due to decreased appetite/ PO intake. I do not think patient is a candidate for admission at this time. I discussed with supervising physician Dr. Kathrynn Humble, who evaluated patient and is in agreement she does not require admission at this time- patient is able to tolerate PO, she has no signs of end-organ damage, and she has a benign abdominal exam. Her electrolytes are being replaced in the ED. Dr. Kathrynn Humble and I discussed this at length with the patient and her friend. Discussed the need for PCP follow up which patient has scheduled this Friday March 1st. Discussed strict return precautions and that she may return to the emergency department at any time for repeat evaluation and possible admission if indicated upon re-evaluation.    13:30: RE-EVAL: Patient feeling better after fluids, electrolyte replacement, zofran, and ativan. Has received second PO dosing of potassium. Will plan for discharge, will administer additional 1 mg of Ativan PO prior to DC.   13:45: CONSULT: Dr. Kathrynn Humble discussed case with behavioral health- recommended starting patient on Lexapro 10 mg daily with either PCP or [facility]. BH provided [facility] in [place] instructions.   Will DC patient home with prescriptions for 2 weeks of Lexapro, 1 week of 20 mEq daily K-dur, and short courses of Ativan and Zofran. I discussed results, treatment plan, need for PCP follow-up as scheduled, and return precautions with the patient and her friend. Provided opportunity for questions, patient and her friend confirmed understanding and are in agreement  with plan.   Final Clinical Impressions(s) / ED Diagnoses   Final diagnoses:  Nausea vomiting and diarrhea  Decreased appetite    ED Discharge Orders        Ordered    potassium chloride SA (K-DUR,KLOR-CON) 20 MEQ tablet  Daily     11/08/17 1403    escitalopram (LEXAPRO) 10 MG tablet  Daily     11/08/17 1403    LORazepam (ATIVAN) 0.5 MG tablet  Every 8 hours PRN     11/08/17 1403    ondansetron (ZOFRAN ODT) 4 MG disintegrating tablet  Every 8 hours PRN     11/08/17 1403       Havoc Sanluis, [place] R, PA-C 11/08/17 1532    Varney Biles, MD 11/08/17 1631

## 2017-11-08 NOTE — BH Assessment (Addendum)
Assessment Note  Margaret Green is a 57 y.o. female, in [place] due to dehydration, decreased appetite, and N/V/D x 4-6 weeks. Pt adamantly denies SI, HI, AVH, nor any hx of either. Pt reports taking medications for depression and anxiety @ a couple of months ago. Pt feels like the medication caused her to start losing her appetite, so she weaned herself off of the medication @ 3-4 weeks ago. Pt still reports, however, not being able to keep any food down and even retching when trying to brush her teeth. Pt reports trying protein shakes and not being able to get more than a couple of sips down. As a result of this, pt reports lethargy, confusion, weakness, and decreased concentration at work. Pt indicates that she feels "very nauseated" all the time. Pt reports not getting any sleep last night due to this. Pt is worried about her body shutting down due to the lack of nutrition it's been getting and this is why she presented to the ED.    Consulted with Dr. Buford Dresser. Pt is psych cleared. Advised Mead Slane Petrucelli, PA-C of disposition.  Diagnosis: Deferred  Past Medical History:  Past Medical History:  Diagnosis Date  . 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 Allergy   . Anxiety   . Arthritis   . Endometriosis 08/19/2009   Hysterectomy.     . Family history of diabetes mellitus   . GERD (gastroesophageal reflux disease)   . Headache(784.0)   . Heart murmur    past hx of heart murmur  . Hyperlipidemia   . Hypothyroidism   . Neutropenia   . Raynaud's syndrome   . Sjogrens syndrome ([place])   . Stricture and stenosis of esophagus     Past Surgical History:  Procedure Laterality Date  . ABDOMINAL HYSTERECTOMY    . BREAST BIOPSY     benign  . BREAST REDUCTION SURGERY    . COLONOSCOPY    . HERNIA REPAIR    . POLYPECTOMY    . REDUCTION MAMMAPLASTY    . UPPER GASTROINTESTINAL ENDOSCOPY      Family History:  Family History   Problem Relation Age of Onset  . Coronary artery disease Father        age 18 - 30 stents, all 3 siblings with early cardiac disease  . Colon polyps Father   . Heart attack Father 104  . Heart disease Father   . Cirrhosis Mother        alcohol related  . Alcohol abuse Mother   . Ovarian cancer Maternal Aunt   . Stomach cancer Paternal Aunt   . Esophageal cancer Paternal Aunt   . Diabetes Paternal Grandmother        and aunt  . Colon polyps Sister   . Heart attack Paternal Uncle   . Colon cancer Neg Hx   . Rectal cancer Neg Hx     Social History:  reports that  has never smoked. she has never used smokeless tobacco. She reports that she drinks alcohol. She reports that she does not use drugs.  Additional Social History:  Alcohol / Drug Use Pain Medications: pt denies Prescriptions: pt denies Over the Counter: pt denies History of alcohol / drug use?: No history of alcohol / drug abuse  CIWA: CIWA-Ar BP: (!) 153/97 Pulse Rate: 64 COWS:    Allergies:  Allergies  Allergen Reactions  . Abilify [Aripiprazole]     Nosebleeds  . Codeine  Nausea And Vomiting  . Omeprazole Hives  . Meloxicam Itching and Rash  . Oxycodone-Aspirin Itching and Rash  . Sulfamethoxazole Itching and Rash    Home Medications:  (Not in a hospital admission)  OB/GYN Status:  No LMP recorded. Patient has had a hysterectomy.  General Assessment Data [facility] of Assessment: [facility] TTS Assessment: In system Is this a Tele or Face-to-Face Assessment?: Face-to-Face Is this an Initial Assessment or a Re-assessment for this encounter?: Initial Assessment Marital status: Single Is patient pregnant?: No Pregnancy Status: No Living Arrangements: Alone Can pt return to current living arrangement?: Yes Admission Status: Voluntary Is patient capable of signing voluntary admission?: Yes Referral Source: Self/Family/Friend     Crisis Care Plan Living Arrangements: Alone Name of Psychiatrist: none Name  of Therapist: none  Education Status Is patient currently in school?: No  Risk to self with the past 6 months Suicidal Ideation: No Has patient been a risk to self within the past 6 months prior to admission? : No Suicidal Intent: No Has patient had any suicidal intent within the past 6 months prior to admission? : No Is patient at risk for suicide?: No Suicidal Plan?: No Has patient had any suicidal plan within the past 6 months prior to admission? : No Access to Means: No Previous Attempts/Gestures: No Intentional Self Injurious Behavior: None Family Suicide History: No Recent stressful life event(s): Recent negative physical changes Persecutory voices/beliefs?: No Depression: Yes Depression Symptoms: Insomnia, Fatigue Substance abuse history and/or treatment for substance abuse?: No Suicide prevention information given to non-admitted patients: Not applicable  Risk to Others within the past 6 months Homicidal Ideation: No Does patient have any lifetime risk of violence toward others beyond the six months prior to admission? : No Thoughts of Harm to Others: No Current Homicidal Intent: No Current Homicidal Plan: No Access to Homicidal Means: No History of harm to others?: No Assessment of Violence: None Noted Does patient have access to weapons?: No Criminal Charges Pending?: No Does patient have a court date: No Is patient on probation?: No  Psychosis Hallucinations: None noted Delusions: None noted  Mental Status Report Appearance/Hygiene: Unremarkable Eye Contact: Good Motor Activity: Unremarkable Speech: Logical/coherent Level of Consciousness: Alert Mood: Pleasant Affect: Flat Anxiety Level: Minimal Thought Processes: Coherent, Relevant Judgement: Unimpaired Orientation: Person, Place, Time, Situation Obsessive Compulsive Thoughts/Behaviors: None  Cognitive Functioning Concentration: Normal Memory: Recent Intact, Remote Intact IQ: Average Insight:  Good Impulse Control: Good Appetite: Poor Sleep: Decreased Vegetative Symptoms: None  ADLScreening [facility] Assessment Services) Patient's cognitive ability adequate to safely complete daily activities?: Yes Patient able to express need for assistance with ADLs?: Yes Independently performs ADLs?: Yes (appropriate for developmental age)  Prior Inpatient Therapy Prior Inpatient Therapy: No  Prior Outpatient Therapy Prior Outpatient Therapy: Yes Prior Therapy Dates: @ a month ago Reason for Treatment: depression; anxiety Does patient have an ACCT team?: No Does patient have Intensive In-House Services?  : No Does patient have [facility] services? : No Does patient have P4CC services?: No  ADL Screening (condition at time of admission) Patient's cognitive ability adequate to safely complete daily activities?: Yes Is the patient deaf or have difficulty hearing?: No Does the patient have difficulty seeing, even when wearing glasses/contacts?: No Does the patient have difficulty concentrating, remembering, or making decisions?: No Patient able to express need for assistance with ADLs?: Yes Does the patient have difficulty dressing or bathing?: No Independently performs ADLs?: Yes (appropriate for developmental age) Does the patient have difficulty walking or  climbing stairs?: No Weakness of Legs: None Weakness of Arms/Hands: None  Home Assistive Devices/Equipment Home Assistive Devices/Equipment: None    Abuse/Neglect Assessment (Assessment to be complete while patient is alone) Abuse/Neglect Assessment Can Be Completed: Yes Physical Abuse: Denies Verbal Abuse: Denies Sexual Abuse: Denies Exploitation of patient/patient's resources: Denies Self-Neglect: Denies     Regulatory affairs officer (For Healthcare) Does Patient Have a Medical Advance Directive?: No Would patient like information on creating a medical advance directive?: No - Patient declined Nutrition Screen- [facility]  Adult/[facility]/AP Patient's home diet: Regular Has the patient been eating poorly because of a decreased appetite?: Yes  Additional Information 1:1 In Past 12 Months?: No CIRT Risk: No Elopement Risk: No Does patient have medical clearance?: No     Disposition:  Disposition Initial Assessment Completed for this Encounter: Yes(consulted with Dr. Buford Dresser)  On Site Evaluation by:   Reviewed with Physician:    Rexene Edison 11/08/2017 9:39 AM

## 2017-11-08 NOTE — ED Notes (Signed)
Date and time results received: 11/08/17 0803 (use smartphrase ".now" to insert current time)  Test: K+ Critical Value: 2.5  Name of Provider Notified: Joneen Boers.  Orders Received? Or Actions Taken?:

## 2017-11-08 NOTE — BH Assessment (Addendum)
[facility] Assessment Progress Note  Per Buford Dresser, DO, this pt does not require psychiatric hospitalization at this time.  Pt is psychiatrically cleared.  She does not require behavioral health resources.  Pt's nurse has been notified.  Jalene Mullet, MA Triage Specialist [phone]   Addendum:  After further consultation with Ethelene Hal, FNP, and EDP Varney Biles, MD, it has been decided that pt would benefit from follow up with [facility] [place].  Referral information has been included in pt's discharge instructions.  Pt's nurse has been notified.  Jalene Mullet, [place] Coordinator [phone]

## 2017-11-08 NOTE — Discharge Instructions (Addendum)
You were seen in the emergency department today for decreased appetite, nausea, vomiting, and diarrhea.  Your potassium and your magnesium were both low in the emergency department, these are electrolytes.  These were replaced through your IV and through the pills that we have given you.  We have given you a prescription for daily potassium for the next 1 week to ensure this remains at an appropriate level.  The rest of your lab work was reassuring, there were no significant abnormalities in your kidney or liver function.  Your urine did not reveal infection.  Your white blood cell count which we used to check infection was not elevated.  You are not anemic.  We have prescribed you multiple medications after your visit today: -K-Dur (potassium) -take 1 tablet daily for the next 7 days to ensure your potassium remains at an appropriate level -Lexapro-take 1 tablet daily for the next 14 days, you will require follow-up with your primary care provider or with [facility] as listed below for further management of this medication and your anxiety/depression within the next 2 weeks. -Zofran-you may take this tablet in place under your tongue every 8 hours as needed for nausea/vomiting -Ativan-you may take 1 tablet every 8 hours as needed for anxiety  We have prescribed you multiple new medications today. It is important that when you pick the prescriptions up you discuss the potential interactions of this medication with other medications you are taking, including over the counter medications, with the pharmacists. These new medications have potential side effects. Be sure to contact your primary care provider or return to the emergency department if you are experiencing new symptoms that you are unable to tolerate after starting the medication. You need to receive medical evaluation immediately if you start to experience blistering of the skin, rash, swelling, or difficulty breathing as these signs could indicate a  more serious medication side effect.     Follow-up with your primary care doctor as scheduled this Friday.  Return to the emergency department at any time for any new or worsening symptoms or any other concerns that you may have.  For your behavioral health needs you are advised to follow up with [facility].  New patients are seen at their walk-in clinic.  Walk-in hours are Monday - Friday from 8:00 am - 12:00 pm, and from 1:00 pm - 3:00 pm.  Walk-in patients are seen on a first come, first served basis, so try to arrive as early as possible for the best chance of being seen the same day.  There is an initial fee of $22.50:       [facility] [place], [place] [address]      [phone]

## 2017-11-09 NOTE — Progress Notes (Signed)
Subjective:    Patient ID: Margaret Green, female    DOB: [birth year], 57 y.o.   MRN: [id number]  HPI The patient is here for an acute visit.  She is here with her friend.  Over the summer she had a bad breakup and this resulted in significant depression and she lost weight during this period.  Her depression improved.  Approximately 2 months ago she lost her job and began to feel extreme depression and anxiety.  She started to lose more weight and has lost 18 pounds since December.  She has lost approximately 40 pounds since June.  She was seeing a psychiatrist and she was placed on Risperdal.  She was also taking clonazepam and buspirone.  She stopped these medications are weeks ago.  She feels very agitated, depressed and anxious.  She denies any suicidal thoughts.  She is experiencing no appetite, nausea, vomiting and diarrhea.  She is forcing herself to eat, but is barely eating.  Over the past 6 months she has barely ate anything.  She is trying to drink, but sometimes nothing stays down.  She did go to the emergency room 11/08/17 for the symptoms.  She was found to be hypokalemic.  Most of her symptoms were thought to be psychosomatic.  She was prescribed Lexapro 10 mg daily, Ativan and Zofran.  She feels the Zofran has helped her eat a little.  The Ativan has helped the last couple of nights to sleep.  Last night she slept approximately 5 hours.  She is staying with a friend-her friends do not feel she is safe to stay alone.  She did get a new job 1 month ago.  She has not been to work for the past week, but wants to try to return on Monday.  She is very weak.  She has had intermittent lightheadedness, rigorous chills, headaches, dizziness and palpitations.   She is taking her chronic medications and keeping them down.   Medications and allergies reviewed with patient and updated if appropriate.  Patient Active Problem List   Diagnosis Date Noted  . Osteopenia 08/10/2017  .  Multinodular goiter 03/28/2017  . Chest pain with low risk for cardiac etiology 01/02/2017  . Dyspnea on exertion 01/02/2017  . Tachycardia with hypertension 01/02/2017  . Sjogren's disease ([place]) 08/27/2015  . Depression 07/23/2015  . Adenocarcinoma, colon ([place]) 07/23/2015  . Hoarseness 03/10/2015  . Migraine 08/14/2014  . Allergic rhinitis 08/14/2014  . IBS (irritable bowel syndrome) 08/14/2014  . Essential hypertension, benign 08/14/2014  . GERD (gastroesophageal reflux disease) 08/14/2014  . Right thyroid nodule 09/24/2013  . GAD (generalized anxiety disorder) 09/29/2010  . ANEMIA, PERNICIOUS 02/24/2009  . SPRAIN/STRAIN, LUMBAR REGION 04/16/2007  . Hyperlipidemia 04/13/2007  . RAYNAUD'S DISEASE 04/13/2007  . Osteoarthritis 04/13/2007  . Fibromyalgia 04/13/2007  . Hypothyroidism 04/03/2007    Current Outpatient Medications on File Prior to Visit  Medication Sig Dispense Refill  . Calcium Carbonate-Vitamin D (CALCIUM 600+D) 600-400 MG-UNIT tablet Take 1 tablet by mouth daily.    Marland Kitchen escitalopram (LEXAPRO) 10 MG tablet Take 1 tablet (10 mg total) by mouth daily. 14 tablet 0  . estradiol (VIVELLE-DOT) 0.05 MG/24HR patch Place 1 patch onto the skin 2 (two) times a week. Pt places on Wednesday and Sunday.    . levothyroxine (SYNTHROID, LEVOTHROID) 75 MCG tablet TAKE ONE TABLET BY MOUTH ONCE DAILY 90 tablet 3  . LORazepam (ATIVAN) 0.5 MG tablet Take 1 tablet (0.5 mg total) by mouth every 8 (  eight) hours as needed for anxiety. 10 tablet 0  . losartan (COZAAR) 50 MG tablet Take 1 tablet (50 mg total) by mouth daily. 90 tablet 1  . ondansetron (ZOFRAN ODT) 4 MG disintegrating tablet Take 1 tablet (4 mg total) by mouth every 8 (eight) hours as needed for nausea or vomiting. 10 tablet 0  . potassium chloride SA (K-DUR,KLOR-CON) 20 MEQ tablet Take 1 tablet (20 mEq total) by mouth daily. 7 tablet 0  . rosuvastatin (CRESTOR) 20 MG tablet Take 1 tablet (20 mg total) by mouth daily. 90 tablet 1  .  tiZANidine (ZANAFLEX) 4 MG tablet TAKE ONE TABLET BY MOUTH TWICE DAILY AS NEEDED (Patient taking differently: TAKE ONE TABLET BY MOUTH TWICE DAILY AS NEEDED MUSCLE SPASM) 180 tablet 1   No current facility-administered medications on file prior to visit.     Past Medical History:  Diagnosis Date  . Adenocarcinoma, colon ([place]) 07/23/2015   Removed completely by polypectomy 06/2015   . Allergy   . Anxiety   . Arthritis   . Endometriosis 08/19/2009   Hysterectomy.     . Family history of diabetes mellitus   . GERD (gastroesophageal reflux disease)   . Headache(784.0)   . Heart murmur    past hx of heart murmur  . Hyperlipidemia   . Hypothyroidism   . Neutropenia   . Raynaud's syndrome   . Sjogrens syndrome ([place])   . Stricture and stenosis of esophagus     Past Surgical History:  Procedure Laterality Date  . ABDOMINAL HYSTERECTOMY    . BREAST BIOPSY     benign  . BREAST REDUCTION SURGERY    . COLONOSCOPY    . HERNIA REPAIR    . POLYPECTOMY    . REDUCTION MAMMAPLASTY    . UPPER GASTROINTESTINAL ENDOSCOPY      Social History   Socioeconomic History  . Marital status: Single    Spouse name: None  . Number of children: None  . Years of education: None  . Highest education level: None  Social Needs  . Financial resource strain: None  . Food insecurity - worry: None  . Food insecurity - inability: None  . Transportation needs - medical: None  . Transportation needs - non-medical: None  Occupational History  . None  Tobacco Use  . Smoking status: Never Smoker  . Smokeless tobacco: Never Used  Substance and Sexual Activity  . Alcohol use: Yes    Alcohol/week: 0.0 oz    Comment: occasionally  . Drug use: No  . Sexual activity: Yes  Other Topics Concern  . None  Social History Narrative   Partner for 2 years in 2013. Prior 20 year relationship-still friends. No children. 1 dog-peekapoo.       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       Hobbies: golf, cooking, gardening, poker    Family History  Problem Relation Age of Onset  . Coronary artery disease Father        age 56 - 71 stents, all 3 siblings with early cardiac disease  . Colon polyps Father   . Heart attack Father 1  . Heart disease Father   . Cirrhosis Mother        alcohol related  . Alcohol abuse Mother   . Ovarian cancer Maternal Aunt   . Stomach cancer Paternal Aunt   . Esophageal cancer Paternal Aunt   . Diabetes Paternal Grandmother  and aunt  . Colon polyps Sister   . Heart attack Paternal Uncle   . Colon cancer Neg Hx   . Rectal cancer Neg Hx     Review of Systems  Constitutional: Positive for chills. Negative for fever.  Respiratory: Negative for cough, shortness of breath and wheezing.   Cardiovascular: Positive for palpitations. Negative for chest pain.  Gastrointestinal: Positive for abdominal pain (with diarrhea), diarrhea (2/weeks), nausea and vomiting. Negative for blood in stool.       Some GERD  Neurological: Positive for dizziness, light-headedness and headaches.       Objective:   Vitals:   11/10/17 1541  BP: 124/82  Pulse: 64  Resp: 16  Temp: 98.4 F (36.9 C)  SpO2: 98%   Wt Readings from Last 3 Encounters:  11/10/17 152 lb (68.9 kg)  06/20/17 175 lb (79.4 kg)  06/01/17 174 lb 12.8 oz (79.3 kg)   Body mass index is 25.29 kg/m.   Physical Exam  Constitutional:  Moderately ill-appearing, but in no acute distress  HENT:  Head: Normocephalic and atraumatic.  Eyes: Conjunctivae are normal.  Neck: Neck supple. No tracheal deviation present. No thyromegaly present.  Cardiovascular: Normal rate and regular rhythm.  Pulmonary/Chest: Effort normal and breath sounds normal. She has no wheezes. She has no rales.  Abdominal: Soft. She exhibits no distension. There is no tenderness.  Musculoskeletal: She exhibits no edema.  Lymphadenopathy:    She has no cervical  adenopathy.  Skin: Skin is warm. She is not diaphoretic.  Psychiatric:  Appears very anxious and depressed, flat affect          Assessment & Plan:    See Problem List for Assessment and Plan of chronic medical problems.  Follow-up in 1 week

## 2017-11-10 ENCOUNTER — Ambulatory Visit: Payer: 59 | Admitting: Internal Medicine

## 2017-11-10 ENCOUNTER — Other Ambulatory Visit (INDEPENDENT_AMBULATORY_CARE_PROVIDER_SITE_OTHER): Payer: 59

## 2017-11-10 ENCOUNTER — Encounter: Payer: Self-pay | Admitting: Internal Medicine

## 2017-11-10 VITALS — BP 124/82 | HR 64 | Temp 98.4°F | Resp 16 | Wt 152.0 lb

## 2017-11-10 DIAGNOSIS — E876 Hypokalemia: Secondary | ICD-10-CM

## 2017-11-10 DIAGNOSIS — F411 Generalized anxiety disorder: Secondary | ICD-10-CM | POA: Diagnosis not present

## 2017-11-10 DIAGNOSIS — E039 Hypothyroidism, unspecified: Secondary | ICD-10-CM

## 2017-11-10 DIAGNOSIS — F331 Major depressive disorder, recurrent, moderate: Secondary | ICD-10-CM | POA: Diagnosis not present

## 2017-11-10 LAB — COMPREHENSIVE METABOLIC PANEL
ALK PHOS: 60 U/L (ref 39–117)
ALT: 9 U/L (ref 0–35)
AST: 12 U/L (ref 0–37)
Albumin: 3.2 g/dL — ABNORMAL LOW (ref 3.5–5.2)
BILIRUBIN TOTAL: 0.4 mg/dL (ref 0.2–1.2)
BUN: 7 mg/dL (ref 6–23)
CO2: 26 mEq/L (ref 19–32)
CREATININE: 1.06 mg/dL (ref 0.40–1.20)
Calcium: 9 mg/dL (ref 8.4–10.5)
Chloride: 103 mEq/L (ref 96–112)
GFR: 57.03 mL/min — ABNORMAL LOW (ref >60.00)
GLUCOSE: 103 mg/dL — AB (ref 70–99)
Potassium: 3.4 mEq/L — ABNORMAL LOW (ref 3.5–5.1)
SODIUM: 138 meq/L (ref 135–145)
TOTAL PROTEIN: 6.8 g/dL (ref 6.0–8.3)

## 2017-11-10 LAB — TSH: TSH: 3.89 u[IU]/mL (ref 0.35–4.50)

## 2017-11-10 MED ORDER — POTASSIUM CHLORIDE CRYS ER 20 MEQ PO TBCR: 20.0000 meq | EXTENDED_RELEASE_TABLET | Freq: Every day | ORAL | 0 refills | Status: DC

## 2017-11-10 MED ORDER — ONDANSETRON 4 MG PO TBDP: 4.0000 mg | ORAL_TABLET | Freq: Three times a day (TID) | ORAL | 0 refills | Status: DC | PRN

## 2017-11-10 MED ORDER — LORAZEPAM 0.5 MG PO TABS: 0.5000 mg | ORAL_TABLET | Freq: Three times a day (TID) | ORAL | 0 refills | Status: DC | PRN

## 2017-11-10 MED ORDER — VILAZODONE HCL 20 MG PO TABS: 20.0000 mg | ORAL_TABLET | Freq: Every day | ORAL | 5 refills | Status: DC

## 2017-11-10 NOTE — Assessment & Plan Note (Signed)
Will check TSH given recent weight loss and severe anxiety depression Follows with Dr. Loanne Drilling

## 2017-11-10 NOTE — Assessment & Plan Note (Signed)
Potassium was low in the emergency room and she has been taking potassium pills Will recheck potassium-low potassium related to vomiting and diarrhea Will continue daily potassium pills for now

## 2017-11-10 NOTE — Patient Instructions (Signed)
  Test(s) ordered today. Your results will be released to [facility] (or called to you) after review, usually within 72hours after test completion. If any changes need to be made, you will be notified at that same time.    Medications reviewed and updated.  Changes include starting Viibryd - take as prescribed.  Stop the lexapro.   Your prescription(s) have been submitted to your pharmacy. Please take as directed and contact our office if you believe you are having problem(s) with the medication(s).  Please followup in 1 week

## 2017-11-10 NOTE — Assessment & Plan Note (Signed)
She is experiencing significant anxiety Will continue Ativan at night-at this point she needs to sleep I think she would benefit from seeing a psychiatrist to help get her on the most appropriate medications, for right now we will try to control her significant anxiety and get her sleeping Follow-up in 1 week

## 2017-11-10 NOTE — Assessment & Plan Note (Signed)
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

## 2017-11-11 ENCOUNTER — Encounter: Payer: Self-pay | Admitting: Internal Medicine

## 2017-11-14 ENCOUNTER — Other Ambulatory Visit: Payer: Self-pay | Admitting: Emergency Medicine

## 2017-11-14 MED ORDER — ESCITALOPRAM OXALATE 20 MG PO TABS: 20.0000 mg | ORAL_TABLET | Freq: Every day | ORAL | 1 refills | Status: DC

## 2017-11-16 NOTE — Progress Notes (Signed)
Subjective:    Patient ID: Margaret Green, female    DOB: Apr 19, 1961, 57 y.o.   MRN: [id number]  HPI The patient is here for follow up.  Depression, anxiety:  She is taking the ativan at night.  This is helping her sleep.  She still sometimes has difficulty sleeping, but relates that to who she is staying with and that person staying up a little bit later.  She has to get up at 5 in the morning so she is very tired at the end of the day.  She is taking 20 mg of lexapro.  She feels her depression and anxiety are better, but not ideally controlled.  When I saw her last week she was experiencing no appetite, nausea, vomiting and diarrhea.  She had lost significant weight.  She states she is eating better.  She is still staying with a friend and feels that is helping.  She does have to force herself to eat she still has no appetite, but when she eats the food tastes good.  She denies any vomiting or diarrhea.  She has had some nausea in the morning and has taken antinausea medication on occasion and it has helped.   She fell asleep before she was supposed to come here and realize she was late.  That is why she feels her blood pressure is elevated.  Medications and allergies reviewed with patient and updated if appropriate.  Patient Active Problem List   Diagnosis Date Noted  . Hypokalemia 11/10/2017  . Moderate episode of recurrent major depressive disorder ([place]) 11/10/2017  . Osteopenia 08/10/2017  . Multinodular goiter 03/28/2017  . Chest pain with low risk for cardiac etiology 01/02/2017  . Dyspnea on exertion 01/02/2017  . Tachycardia with hypertension 01/02/2017  . Sjogren's disease ([place]) 08/27/2015  . Depression 07/23/2015  . Adenocarcinoma, colon ([place]) 07/23/2015  . Hoarseness 03/10/2015  . Migraine 08/14/2014  . Allergic rhinitis 08/14/2014  . IBS (irritable bowel syndrome) 08/14/2014  . Essential hypertension, benign 08/14/2014  . GERD (gastroesophageal reflux disease)  08/14/2014  . Right thyroid nodule 09/24/2013  . GAD (generalized anxiety disorder) 09/29/2010  . ANEMIA, PERNICIOUS 02/24/2009  . SPRAIN/STRAIN, LUMBAR REGION 04/16/2007  . Hyperlipidemia 04/13/2007  . RAYNAUD'S DISEASE 04/13/2007  . Osteoarthritis 04/13/2007  . Fibromyalgia 04/13/2007  . Hypothyroidism 04/03/2007    Current Outpatient Medications on File Prior to Visit  Medication Sig Dispense Refill  . Calcium Carbonate-Vitamin D (CALCIUM 600+D) 600-400 MG-UNIT tablet Take 1 tablet by mouth daily.    Marland Kitchen escitalopram (LEXAPRO) 20 MG tablet Take 1 tablet (20 mg total) by mouth daily. 90 tablet 1  . estradiol (VIVELLE-DOT) 0.05 MG/24HR patch Place 1 patch onto the skin 2 (two) times a week. Pt places on Wednesday and Sunday.    . levothyroxine (SYNTHROID, LEVOTHROID) 75 MCG tablet TAKE ONE TABLET BY MOUTH ONCE DAILY 90 tablet 3  . LORazepam (ATIVAN) 0.5 MG tablet Take 1 tablet (0.5 mg total) by mouth every 8 (eight) hours as needed for anxiety. 30 tablet 0  . losartan (COZAAR) 50 MG tablet Take 1 tablet (50 mg total) by mouth daily. 90 tablet 1  . ondansetron (ZOFRAN ODT) 4 MG disintegrating tablet Take 1 tablet (4 mg total) by mouth every 8 (eight) hours as needed for nausea or vomiting. 40 tablet 0  . rosuvastatin (CRESTOR) 20 MG tablet Take 1 tablet (20 mg total) by mouth daily. 90 tablet 1  . tiZANidine (ZANAFLEX) 4 MG tablet TAKE ONE TABLET  BY MOUTH TWICE DAILY AS NEEDED (Patient taking differently: TAKE ONE TABLET BY MOUTH TWICE DAILY AS NEEDED MUSCLE SPASM) 180 tablet 1   No current facility-administered medications on file prior to visit.     Past Medical History:  Diagnosis Date  . Adenocarcinoma, colon ([place]) 07/23/2015   Removed completely by polypectomy 06/2015   . Allergy   . Anxiety   . Arthritis   . Endometriosis 08/19/2009   Hysterectomy.     . Family history of diabetes mellitus   . GERD (gastroesophageal reflux disease)   . Headache(784.0)   . Heart murmur     past hx of heart murmur  . Hyperlipidemia   . Hypothyroidism   . Neutropenia   . Raynaud's syndrome   . Sjogrens syndrome ([place])   . Stricture and stenosis of esophagus     Past Surgical History:  Procedure Laterality Date  . ABDOMINAL HYSTERECTOMY    . BREAST BIOPSY     benign  . BREAST REDUCTION SURGERY    . COLONOSCOPY    . HERNIA REPAIR    . POLYPECTOMY    . REDUCTION MAMMAPLASTY    . UPPER GASTROINTESTINAL ENDOSCOPY      Social History   Socioeconomic History  . Marital status: Single    Spouse name: None  . Number of children: None  . Years of education: None  . Highest education level: None  Social Needs  . Financial resource strain: None  . Food insecurity - worry: None  . Food insecurity - inability: None  . Transportation needs - medical: None  . Transportation needs - non-medical: None  Occupational History  . None  Tobacco Use  . Smoking status: Never Smoker  . Smokeless tobacco: Never Used  Substance and Sexual Activity  . Alcohol use: Yes    Alcohol/week: 0.0 oz    Comment: occasionally  . Drug use: No  . Sexual activity: Yes  Other Topics Concern  . None  Social History Narrative   Partner for 2 years in 2013. Prior 20 year relationship-still friends. No children. 1 dog-peekapoo.       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       Hobbies: golf, cooking, gardening, poker    Family History  Problem Relation Age of Onset  . Coronary artery disease Father        age 42 - 72 stents, all 3 siblings with early cardiac disease  . Colon polyps Father   . Heart attack Father 18  . Heart disease Father   . Cirrhosis Mother        alcohol related  . Alcohol abuse Mother   . Ovarian cancer Maternal Aunt   . Stomach cancer Paternal Aunt   . Esophageal cancer Paternal Aunt   . Diabetes Paternal Grandmother        and aunt  . Colon polyps Sister   . Heart attack Paternal Uncle     . Colon cancer Neg Hx   . Rectal cancer Neg Hx     Review of Systems  Constitutional: Positive for appetite change (Decreased). Negative for unexpected weight change.  Gastrointestinal: Positive for nausea. Negative for diarrhea and vomiting.  Neurological: Negative for light-headedness and headaches.  Psychiatric/Behavioral: Positive for dysphoric mood and sleep disturbance. Negative for suicidal ideas. The patient is nervous/anxious.        Objective:   Vitals:   11/17/17 1600  BP: (!) 164/100  Pulse: 89  Resp: 16  Temp: 98.9 F (37.2 C)  SpO2: 97%   BP Readings from Last 3 Encounters:  11/17/17 (!) 164/100  11/10/17 124/82  11/08/17 (!) 145/82    Wt Readings from Last 3 Encounters:  11/17/17 152 lb (68.9 kg)  11/10/17 152 lb (68.9 kg)  06/20/17 175 lb (79.4 kg)   Body mass index is 25.29 kg/m.   Physical Exam  Constitutional: She is oriented to person, place, and time. She appears well-developed and well-nourished. No distress.  HENT:  Head: Normocephalic and atraumatic.  Neurological: She is alert and oriented to person, place, and time.  Skin: Skin is warm and dry. She is not diaphoretic.  Psychiatric:  Mood and affect is both depressed and flat as well as anxious.  Her thinking is slightly slow.  Judgment is normal.           Assessment & Plan:    See Problem List for Assessment and Plan of chronic medical problems.

## 2017-11-17 ENCOUNTER — Encounter: Payer: Self-pay | Admitting: Internal Medicine

## 2017-11-17 ENCOUNTER — Encounter: Payer: Self-pay | Admitting: Emergency Medicine

## 2017-11-17 ENCOUNTER — Ambulatory Visit: Payer: 59 | Admitting: Internal Medicine

## 2017-11-17 DIAGNOSIS — F331 Major depressive disorder, recurrent, moderate: Secondary | ICD-10-CM | POA: Diagnosis not present

## 2017-11-17 DIAGNOSIS — F411 Generalized anxiety disorder: Secondary | ICD-10-CM | POA: Diagnosis not present

## 2017-11-17 DIAGNOSIS — I10 Essential (primary) hypertension: Secondary | ICD-10-CM

## 2017-11-17 DIAGNOSIS — E876 Hypokalemia: Secondary | ICD-10-CM

## 2017-11-17 NOTE — Assessment & Plan Note (Signed)
Anxiety is better, but still not ideally controlled Continue Lexapro 20 mg daily Still having sleep difficulties, but controlled with Ativan 0.5 mg nightly and we will continue this Overall improvement and she is anxious to feel better, but advised that this will take some time Encouraged her to see a therapist-can consider employee assistance program/counseling She may or may not have coverage with her insurance, but will look into this-therapist names given Follow-up in 4 weeks, sooner if needed

## 2017-11-17 NOTE — Assessment & Plan Note (Signed)
Slow improvement on Lexapro 20 mg daily Other medication not covered by her insurance She denies side effects Encouraged her to speak to a therapist or go to [employer] assistance counseling program if she does not have coverage with her insurance She is currently staying with a friend and will continue to stay with a friend since this is helping Follow-up in 4 weeks, sooner if needed

## 2017-11-17 NOTE — Assessment & Plan Note (Signed)
Blood pressure very elevated here today, but she was rushing because she took a nap and woke up at her appointment time at home We will not make any adjustments today Follow-up in 4 weeks to recheck

## 2017-11-17 NOTE — Assessment & Plan Note (Signed)
Has been taking potassium daily She is now eating much better and denies any vomiting or diarrhea Has never had a potassium problem in the past Discontinue potassium supplementation

## 2017-11-17 NOTE — Patient Instructions (Addendum)
Medications reviewed and updated.  Stop the potassium pills  Employee Assistance Counseling Program [phone]     Please followup in 4 weeks    [facility] [phone]  Dr Letta Moynahan [address], [place], [place] [address]  [place], [place]  [facility] [address], [place], [place], [phone] Office [phone] [facility]            [place], [place]

## 2017-12-12 ENCOUNTER — Other Ambulatory Visit: Payer: Self-pay | Admitting: Internal Medicine

## 2017-12-13 NOTE — Telephone Encounter (Signed)
[place] Controlled Substance Database checked. Last filled on 11/10/17 

## 2017-12-19 ENCOUNTER — Ambulatory Visit: Payer: BLUE CROSS/BLUE SHIELD | Admitting: Internal Medicine

## 2017-12-21 NOTE — Progress Notes (Signed)
Subjective:    Patient ID: Margaret Green, female    DOB: 1960/11/12, 57 y.o.   MRN: [id number]  HPI She is here for a physical exam.    Hypertension: She is taking her medication daily. She is compliant with a low sodium diet.  She denies chest pain, palpitations, edema, shortness of breath and regular headaches. She is very active at work and dose some walking outside of work.  She does not monitor her blood pressure at home.    Hyperlipidemia: She is taking her medication daily. She is compliant with a low fat/cholesterol diet. She is exercising. She denies myalgias.   Hypothyroidism:  She is taking her medication daily.  She denies any recent changes in energy or weight that are unexplained.   B12 deficiency:  She is not taking B12.  She did receive injections in the past.    Depression: She is taking her medication daily as prescribed. She denies any side effects from the medication. She feels her depression is well controlled and she is happy with her current dose of medication.  She overall feels much better.  She is eating well and sleeping well.  Anxiety: She is taking her medication daily as prescribed. She denies any side effects from the medication. She feels her anxiety is well controlled and she is happy with her current dose of medication.    Medications and allergies reviewed with patient and updated if appropriate.  Patient Active Problem List   Diagnosis Date Noted  . Hypokalemia 11/10/2017  . Moderate episode of recurrent major depressive disorder ([place]) 11/10/2017  . Osteopenia 08/10/2017  . Multinodular goiter 03/28/2017  . Chest pain with low risk for cardiac etiology 01/02/2017  . Dyspnea on exertion 01/02/2017  . Tachycardia with hypertension 01/02/2017  . Sjogren's disease ([place]) 08/27/2015  . Depression 07/23/2015  . Adenocarcinoma, colon ([place]) 07/23/2015  . Hoarseness 03/10/2015  . Migraine 08/14/2014  . Allergic rhinitis 08/14/2014  . IBS  (irritable bowel syndrome) 08/14/2014  . Essential hypertension, benign 08/14/2014  . GERD (gastroesophageal reflux disease) 08/14/2014  . Right thyroid nodule 09/24/2013  . GAD (generalized anxiety disorder) 09/29/2010  . ANEMIA, PERNICIOUS 02/24/2009  . Hyperlipidemia 04/13/2007  . RAYNAUD'S DISEASE 04/13/2007  . Osteoarthritis 04/13/2007  . Fibromyalgia 04/13/2007  . Hypothyroidism 04/03/2007    Current Outpatient Medications on File Prior to Visit  Medication Sig Dispense Refill  . Calcium Carbonate-Vitamin D (CALCIUM 600+D) 600-400 MG-UNIT tablet Take 1 tablet by mouth daily.    Marland Kitchen escitalopram (LEXAPRO) 20 MG tablet Take 1 tablet (20 mg total) by mouth daily. 90 tablet 1  . estradiol (VIVELLE-DOT) 0.05 MG/24HR patch Place 1 patch onto the skin 2 (two) times a week. Pt places on Wednesday and Sunday.    . levothyroxine (SYNTHROID, LEVOTHROID) 75 MCG tablet TAKE ONE TABLET BY MOUTH ONCE DAILY 90 tablet 3  . LORazepam (ATIVAN) 0.5 MG tablet TAKE 1 TABLET BY MOUTH EVERY 8 HOURS AS NEEDED FOR ANXIETY 30 tablet 0  . losartan (COZAAR) 50 MG tablet Take 1 tablet (50 mg total) by mouth daily. 90 tablet 1  . ondansetron (ZOFRAN ODT) 4 MG disintegrating tablet Take 1 tablet (4 mg total) by mouth every 8 (eight) hours as needed for nausea or vomiting. 40 tablet 0  . rosuvastatin (CRESTOR) 20 MG tablet Take 1 tablet (20 mg total) by mouth daily. 90 tablet 1  . tiZANidine (ZANAFLEX) 4 MG tablet TAKE ONE TABLET BY MOUTH TWICE DAILY AS NEEDED (Patient  taking differently: TAKE ONE TABLET BY MOUTH TWICE DAILY AS NEEDED MUSCLE SPASM) 180 tablet 1   No current facility-administered medications on file prior to visit.     Past Medical History:  Diagnosis Date  . Adenocarcinoma, colon ([place]) 07/23/2015   Removed completely by polypectomy 06/2015   . Allergy   . Anxiety   . Arthritis   . Endometriosis 08/19/2009   Hysterectomy.     . Family history of diabetes mellitus   . GERD (gastroesophageal  reflux disease)   . Headache(784.0)   . Heart murmur    past hx of heart murmur  . Hyperlipidemia   . Hypothyroidism   . Neutropenia   . Raynaud's syndrome   . Sjogrens syndrome ([place])   . Stricture and stenosis of esophagus     Past Surgical History:  Procedure Laterality Date  . ABDOMINAL HYSTERECTOMY    . BREAST BIOPSY     benign  . BREAST REDUCTION SURGERY    . COLONOSCOPY    . HERNIA REPAIR    . POLYPECTOMY    . REDUCTION MAMMAPLASTY    . UPPER GASTROINTESTINAL ENDOSCOPY      Social History   Socioeconomic History  . Marital status: Single    Spouse name: Not on file  . Number of children: Not on file  . Years of education: Not on file  . Highest education level: Not on file  Occupational History  . Not on file  Social Needs  . Financial resource strain: Not on file  . Food insecurity:    Worry: Not on file    Inability: Not on file  . Transportation needs:    Medical: Not on file    Non-medical: Not on file  Tobacco Use  . Smoking status: Never Smoker  . Smokeless tobacco: Never Used  Substance and Sexual Activity  . Alcohol use: Yes    Alcohol/week: 0.0 oz    Comment: occasionally  . Drug use: No  . Sexual activity: Yes  Lifestyle  . Physical activity:    Days per week: Not on file    Minutes per session: Not on file  . Stress: Not on file  Relationships  . Social connections:    Talks on phone: Not on file    Gets together: Not on file    Attends religious service: Not on file    Active member of club or organization: Not on file    Attends meetings of clubs or organizations: Not on file    Relationship status: Not on file  Other Topics Concern  . Not on file  Social History Narrative   Partner for 2 years in 2013. Prior 20 year relationship-still friends. No children. 1 dog-peekapoo.       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       Hobbies: golf, cooking, gardening,  poker    Family History  Problem Relation Age of Onset  . Coronary artery disease Father        age 65 - 15 stents, all 3 siblings with early cardiac disease  . Colon polyps Father   . Heart attack Father 9  . Heart disease Father   . Cirrhosis Mother        alcohol related  . Alcohol abuse Mother   . Ovarian cancer Maternal Aunt   . Stomach cancer Paternal Aunt   . Esophageal cancer Paternal Aunt   . Diabetes Paternal Grandmother  and aunt  . Colon polyps Sister   . Heart attack Paternal Uncle   . Colon cancer Neg Hx   . Rectal cancer Neg Hx     Review of Systems  Constitutional: Negative for chills and fever.  Eyes: Negative for visual disturbance.  Respiratory: Negative for cough, shortness of breath and wheezing.   Cardiovascular: Negative for chest pain, palpitations and leg swelling.  Gastrointestinal: Negative for abdominal pain and nausea.  Genitourinary: Negative for dysuria and hematuria.  Musculoskeletal: Negative for arthralgias and back pain.  Skin: Negative for color change and rash.  Neurological: Negative for light-headedness, numbness and headaches.  Psychiatric/Behavioral: Positive for dysphoric mood. Negative for sleep disturbance. The patient is nervous/anxious.        Objective:   Vitals:   12/22/17 1457  BP: (!) 146/80  Pulse: 62  Resp: 16  Temp: 98.9 F (37.2 C)  SpO2: 97%   BP Readings from Last 3 Encounters:  12/22/17 (!) 146/80  11/17/17 (!) 164/100  11/10/17 124/82   Wt Readings from Last 3 Encounters:  12/22/17 149 lb (67.6 kg)  11/17/17 152 lb (68.9 kg)  11/10/17 152 lb (68.9 kg)   Body mass index is 24.79 kg/m.   Physical Exam    Constitutional: She appears well-developed and well-nourished. No distress.  HENT:  Head: Normocephalic and atraumatic.  Right Ear: External ear normal. Normal ear canal and TM Left Ear: External ear normal.  Normal ear canal and TM Mouth/Throat: Oropharynx is clear and moist.  Eyes:  Conjunctivae and EOM are normal.  Neck: Neck supple. No tracheal deviation present. No thyromegaly present.  No carotid bruit  Cardiovascular: Normal rate, regular rhythm and normal heart sounds.   No murmur heard.  No edema. Pulmonary/Chest: Effort normal and breath sounds normal. No respiratory distress. She has no wheezes. She has no rales.  Breast: deferred to Gyn Abdominal: Soft. She exhibits no distension. There is no tenderness.  Lymphadenopathy: She has no cervical adenopathy.  Skin: Skin is warm and dry. She is not diaphoretic.  Psychiatric: She has a normal mood and affect. Her behavior is normal.       Assessment & Plan:   Physical exam: Screening blood work  ordered Immunizations  Up to date  Colonoscopy   Up to date  Mammogram   Up to date  Gyn  Up to date  Eye exams   Up to date  Exercise  Active at work, walking in evening Weight    BMI is normal Skin   No concerns Substance abuse   none  See Problem List for Assessment and Plan of chronic medical problems.   FU in 6 months

## 2017-12-22 ENCOUNTER — Encounter: Payer: Self-pay | Admitting: Internal Medicine

## 2017-12-22 ENCOUNTER — Other Ambulatory Visit (INDEPENDENT_AMBULATORY_CARE_PROVIDER_SITE_OTHER): Payer: 59

## 2017-12-22 ENCOUNTER — Ambulatory Visit: Payer: 59 | Admitting: Internal Medicine

## 2017-12-22 VITALS — BP 146/80 | HR 62 | Temp 98.9°F | Resp 16 | Ht 65.0 in | Wt 149.0 lb

## 2017-12-22 DIAGNOSIS — R739 Hyperglycemia, unspecified: Secondary | ICD-10-CM

## 2017-12-22 DIAGNOSIS — I10 Essential (primary) hypertension: Secondary | ICD-10-CM

## 2017-12-22 DIAGNOSIS — E038 Other specified hypothyroidism: Secondary | ICD-10-CM | POA: Diagnosis not present

## 2017-12-22 DIAGNOSIS — F411 Generalized anxiety disorder: Secondary | ICD-10-CM | POA: Diagnosis not present

## 2017-12-22 DIAGNOSIS — D510 Vitamin B12 deficiency anemia due to intrinsic factor deficiency: Secondary | ICD-10-CM

## 2017-12-22 DIAGNOSIS — E7849 Other hyperlipidemia: Secondary | ICD-10-CM

## 2017-12-22 DIAGNOSIS — F331 Major depressive disorder, recurrent, moderate: Secondary | ICD-10-CM

## 2017-12-22 LAB — COMPREHENSIVE METABOLIC PANEL
ALBUMIN: 3.9 g/dL (ref 3.5–5.2)
ALT: 9 U/L (ref 0–35)
AST: 12 U/L (ref 0–37)
Alkaline Phosphatase: 59 U/L (ref 39–117)
BUN: 12 mg/dL (ref 6–23)
CHLORIDE: 103 meq/L (ref 96–112)
CO2: 31 mEq/L (ref 19–32)
CREATININE: 0.95 mg/dL (ref 0.40–1.20)
Calcium: 9.4 mg/dL (ref 8.4–10.5)
GFR: 64.69 mL/min (ref >60.00)
Glucose, Bld: 80 mg/dL (ref 70–99)
Potassium: 3.7 mEq/L (ref 3.5–5.1)
SODIUM: 139 meq/L (ref 135–145)
Total Bilirubin: 0.3 mg/dL (ref 0.2–1.2)
Total Protein: 7.1 g/dL (ref 6.0–8.3)

## 2017-12-22 LAB — LIPID PANEL
CHOL/HDL RATIO: 6
Cholesterol: 239 mg/dL — ABNORMAL HIGH (ref 0–200)
HDL: 41.3 mg/dL (ref >39.00)
LDL CALC: 162 mg/dL — AB (ref 0–99)
NonHDL: 197.97
Triglycerides: 178 mg/dL — ABNORMAL HIGH (ref 0.0–149.0)
VLDL: 35.6 mg/dL (ref 0.0–40.0)

## 2017-12-22 LAB — CBC WITH DIFFERENTIAL/PLATELET
BASOS ABS: 0.1 10*3/uL (ref 0.0–0.1)
BASOS PCT: 1 % (ref 0.0–3.0)
EOS ABS: 0.1 10*3/uL (ref 0.0–0.7)
Eosinophils Relative: 1.5 % (ref 0.0–5.0)
HCT: 38.5 % (ref 36.0–46.0)
HEMOGLOBIN: 13 g/dL (ref 12.0–15.0)
Lymphocytes Relative: 36.5 % (ref 12.0–46.0)
Lymphs Abs: 2.1 10*3/uL (ref 0.7–4.0)
MCHC: 33.8 g/dL (ref 30.0–36.0)
MCV: 93 fl (ref 78.0–100.0)
MONO ABS: 0.5 10*3/uL (ref 0.1–1.0)
Monocytes Relative: 8.8 % (ref 3.0–12.0)
Neutro Abs: 3 10*3/uL (ref 1.4–7.7)
Neutrophils Relative %: 52.2 % (ref 43.0–77.0)
Platelets: 293 10*3/uL (ref 150.0–400.0)
RBC: 4.14 Mil/uL (ref 3.87–5.11)
RDW: 14.5 % (ref 11.5–15.5)
WBC: 5.8 10*3/uL (ref 4.0–10.5)

## 2017-12-22 LAB — HEMOGLOBIN A1C: Hgb A1c MFr Bld: 5.5 % (ref 4.6–6.5)

## 2017-12-22 LAB — TSH: TSH: 4.44 u[IU]/mL (ref 0.35–4.50)

## 2017-12-22 LAB — VITAMIN B12: Vitamin B-12: 169 pg/mL — ABNORMAL LOW (ref 211–911)

## 2017-12-22 MED ORDER — LOSARTAN POTASSIUM 50 MG PO TABS: 50.0000 mg | ORAL_TABLET | Freq: Every day | ORAL | 1 refills | Status: DC

## 2017-12-22 MED FILL — LOSARTAN POTASSIUM 50 MG TA: 50 | 90 days supply | Qty: 90 | Fill #0

## 2017-12-22 NOTE — Patient Instructions (Signed)

## 2017-12-23 DIAGNOSIS — R739 Hyperglycemia, unspecified: Secondary | ICD-10-CM

## 2017-12-23 DIAGNOSIS — R7303 Prediabetes: Secondary | ICD-10-CM | POA: Insufficient documentation

## 2017-12-23 NOTE — Assessment & Plan Note (Signed)
Check lipid panel  Continue daily statin Regular exercise and healthy diet encouraged  

## 2017-12-23 NOTE — Assessment & Plan Note (Signed)
Controlled, stable Continue current dose of medication  

## 2017-12-23 NOTE — Assessment & Plan Note (Signed)
Check tsh  Titrate med dose if needed  

## 2017-12-23 NOTE — Assessment & Plan Note (Signed)
H/o low B12 - ? Pernicious anemia Check B12 level

## 2017-12-23 NOTE — Assessment & Plan Note (Signed)
Check a1c Low sugar / carb diet Stressed regular exercise   

## 2017-12-23 NOTE — Assessment & Plan Note (Signed)
Much improved Controlled, stable Continue current dose of medication

## 2017-12-23 NOTE — Assessment & Plan Note (Signed)
BP slightly elevated here She will start checking her BP  Will continue current medication

## 2017-12-25 ENCOUNTER — Encounter: Payer: Self-pay | Admitting: Internal Medicine

## 2018-01-08 MED FILL — DICLOFENAC SODIUM 75 MG TAB: 75 | 30 days supply | Qty: 60 | Fill #0

## 2018-01-09 ENCOUNTER — Ambulatory Visit (INDEPENDENT_AMBULATORY_CARE_PROVIDER_SITE_OTHER): Payer: 59

## 2018-01-09 DIAGNOSIS — E538 Deficiency of other specified B group vitamins: Secondary | ICD-10-CM | POA: Diagnosis not present

## 2018-01-09 MED ORDER — CYANOCOBALAMIN 1000 MCG/ML IJ SOLN
1000.0000 ug | Freq: Once | INTRAMUSCULAR | Status: AC
  Administered 2018-01-09: 1000 ug via INTRAMUSCULAR

## 2018-01-09 NOTE — Progress Notes (Signed)
b12 Injection given.   Margaret Green J Onie Hayashi, MD  

## 2018-01-10 ENCOUNTER — Other Ambulatory Visit: Payer: Self-pay | Admitting: Internal Medicine

## 2018-01-11 MED ORDER — LORAZEPAM 0.5 MG PO TABS: 0.5000 mg | ORAL_TABLET | Freq: Three times a day (TID) | ORAL | 0 refills | Status: DC | PRN

## 2018-01-11 NOTE — Telephone Encounter (Signed)
[place] Controlled Substance Database checked. Last filled on 12/13/17 

## 2018-01-31 ENCOUNTER — Telehealth: Payer: Self-pay | Admitting: *Deleted

## 2018-01-31 MED ORDER — ESTRADIOL 0.05 MG/24HR TD PTTW: 1.0000 | MEDICATED_PATCH | TRANSDERMAL | 1 refills | Status: DC

## 2018-01-31 MED FILL — ESTRADIOL 0.05 MG PATCH: 0.05 | 84 days supply | Qty: 24 | Fill #0

## 2018-01-31 NOTE — Telephone Encounter (Signed)
Rx sent 

## 2018-01-31 NOTE — Telephone Encounter (Signed)
Patient called requesting refill on vivelle dot patch twice weekly 0.05 mg, has annual scheduled on 05/18/18. Paper chart has arrived last seen in 06/2015. Okay to send Rx?

## 2018-01-31 NOTE — Telephone Encounter (Signed)
Yes, agree with refill until Annual/Gyn exam.

## 2018-02-08 ENCOUNTER — Encounter: Payer: Self-pay | Admitting: Internal Medicine

## 2018-02-08 ENCOUNTER — Ambulatory Visit (INDEPENDENT_AMBULATORY_CARE_PROVIDER_SITE_OTHER): Payer: 59

## 2018-02-08 DIAGNOSIS — E538 Deficiency of other specified B group vitamins: Secondary | ICD-10-CM

## 2018-02-08 MED ORDER — CYANOCOBALAMIN 1000 MCG/ML IJ SOLN
1000.0000 ug | Freq: Once | INTRAMUSCULAR | Status: AC
  Administered 2018-02-08: 1000 ug via INTRAMUSCULAR

## 2018-02-08 MED ORDER — LORAZEPAM 0.5 MG PO TABS: 0.5000 mg | ORAL_TABLET | Freq: Three times a day (TID) | ORAL | 0 refills | Status: DC | PRN

## 2018-02-08 MED FILL — LORazepam 0.5 MG TABS: 0.5 | 10 days supply | Qty: 30 | Fill #0

## 2018-02-08 NOTE — Telephone Encounter (Signed)
[place] Controlled Substance Database checked. Last filled on 01/11/18

## 2018-02-09 NOTE — Progress Notes (Signed)
b12 Injection given.   Darlette Dubow J Cailee Blanke, MD  

## 2018-02-15 MED FILL — ESCITALOPRAM 20 MG TABLET: 20 | 90 days supply | Qty: 90 | Fill #0

## 2018-03-19 ENCOUNTER — Encounter: Payer: Self-pay | Admitting: Internal Medicine

## 2018-03-19 MED ORDER — TIZANIDINE HCL 4 MG PO TABS: ORAL_TABLET | ORAL | 1 refills | Status: DC

## 2018-03-19 MED FILL — LOSARTAN POTASSIUM 50 MG TA: 50 | 90 days supply | Qty: 90 | Fill #1

## 2018-03-19 MED FILL — tiZANidine HCL 4 MG TABS: 4 | 90 days supply | Qty: 180 | Fill #0

## 2018-03-19 MED FILL — LEVOTHYROXINE 75 MCG TABLET: 75 | 90 days supply | Qty: 90 | Fill #0

## 2018-03-28 ENCOUNTER — Ambulatory Visit (INDEPENDENT_AMBULATORY_CARE_PROVIDER_SITE_OTHER): Payer: 59 | Admitting: Endocrinology

## 2018-03-28 ENCOUNTER — Encounter: Payer: Self-pay | Admitting: Endocrinology

## 2018-03-28 VITALS — BP 110/70 | HR 68 | Wt 154.8 lb

## 2018-03-28 DIAGNOSIS — E041 Nontoxic single thyroid nodule: Secondary | ICD-10-CM | POA: Diagnosis not present

## 2018-03-28 MED ORDER — LEVOTHYROXINE SODIUM 88 MCG PO TABS: 88.0000 ug | ORAL_TABLET | Freq: Every day | ORAL | 3 refills | Status: DC

## 2018-03-28 NOTE — Progress Notes (Signed)
Subjective:    Patient ID: Margaret Green, female    DOB: November 26, 1960, 57 y.o.   MRN: [id number]  HPI Pt returns for chronic primary hypothyroidism (dx'ed 1982; she has been on prescribed thyroid hormone therapy since then; over the past few years, the synthroid dosage has varied between 75 and 100 mcg/day; [place] in 2015 showed small multinodular goiter; f/u US in 2018 showed little change).  She does not notice the thyroid nodules. pt states she feels well in general.  She does notice any change in the size of the goiter Past Medical History:  Diagnosis Date  . 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 Allergy   . Anxiety   . Arthritis   . Endometriosis 08/19/2009   Hysterectomy.     . Family history of diabetes mellitus   . GERD (gastroesophageal reflux disease)   . Headache(784.0)   . Heart murmur    past hx of heart murmur  . Hyperlipidemia   . Hypothyroidism   . Neutropenia   . Raynaud's syndrome   . Sjogrens syndrome ([place])   . Stricture and stenosis of esophagus     Past Surgical History:  Procedure Laterality Date  . ABDOMINAL HYSTERECTOMY    . BREAST BIOPSY     benign  . BREAST REDUCTION SURGERY    . COLONOSCOPY    . HERNIA REPAIR    . POLYPECTOMY    . REDUCTION MAMMAPLASTY    . UPPER GASTROINTESTINAL ENDOSCOPY      Social History   Socioeconomic History  . Marital status: Single    Spouse name: Not on file  . Number of children: Not on file  . Years of education: Not on file  . Highest education level: Not on file  Occupational History  . Not on file  Social Needs  . Financial resource strain: Not on file  . Food insecurity:    Worry: Not on file    Inability: Not on file  . Transportation needs:    Medical: Not on file    Non-medical: Not on file  Tobacco Use  . Smoking status: Never Smoker  . Smokeless tobacco: Never Used  Substance and Sexual Activity  . Alcohol use: Yes    Alcohol/week: 0.0 oz      Comment: occasionally  . Drug use: No  . Sexual activity: Yes  Lifestyle  . Physical activity:    Days per week: Not on file    Minutes per session: Not on file  . Stress: Not on file  Relationships  . Social connections:    Talks on phone: Not on file    Gets together: Not on file    Attends religious service: Not on file    Active member of club or organization: Not on file    Attends meetings of clubs or organizations: Not on file    Relationship status: Not on file  . Intimate partner violence:    Fear of current or ex partner: Not on file    Emotionally abused: Not on file    Physically abused: Not on file    Forced sexual activity: Not on file  Other Topics Concern  . Not on file  Social History Narrative   Partner for 2 years in 2013. Prior 20 year relationship-still friends. No children. 1 dog-peekapoo.       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  Hobbies: golf, cooking, gardening, poker    Current Outpatient Medications on File Prior to Visit  Medication Sig Dispense Refill  . Calcium Carbonate-Vitamin D (CALCIUM 600+D) 600-400 MG-UNIT tablet Take 1 tablet by mouth daily.    Marland Kitchen escitalopram (LEXAPRO) 20 MG tablet Take 1 tablet (20 mg total) by mouth daily. 90 tablet 1  . estradiol (VIVELLE-DOT) 0.05 MG/24HR patch Place 1 patch (0.05 mg total) onto the skin 2 (two) times a week. Pt places on Wednesday and Sunday. 24 patch 1  . LORazepam (ATIVAN) 0.5 MG tablet Take 1 tablet (0.5 mg total) by mouth every 8 (eight) hours as needed. for anxiety 30 tablet 0  . losartan (COZAAR) 50 MG tablet Take 1 tablet (50 mg total) by mouth daily. 90 tablet 1  . rosuvastatin (CRESTOR) 20 MG tablet Take 1 tablet (20 mg total) by mouth daily. 90 tablet 1  . tiZANidine (ZANAFLEX) 4 MG tablet TAKE ONE TABLET BY MOUTH TWICE DAILY AS NEEDED MUSCLE SPASM 180 tablet 1   No current facility-administered medications on  file prior to visit.     Allergies  Allergen Reactions  . Abilify [Aripiprazole]     Nosebleeds  . Codeine Nausea And Vomiting  . Omeprazole Hives  . Meloxicam Itching and Rash  . Oxycodone-Aspirin Itching and Rash  . Sulfamethoxazole Itching and Rash    Family History  Problem Relation Age of Onset  . Coronary artery disease Father        age 16 - 42 stents, all 3 siblings with early cardiac disease  . Colon polyps Father   . Heart attack Father 35  . Heart disease Father   . Cirrhosis Mother        alcohol related  . Alcohol abuse Mother   . Ovarian cancer Maternal Aunt   . Stomach cancer Paternal Aunt   . Esophageal cancer Paternal Aunt   . Diabetes Paternal Grandmother        and aunt  . Colon polyps Sister   . Heart attack Paternal Uncle   . Colon cancer Neg Hx   . Rectal cancer Neg Hx     BP 110/70 (BP Location: Left Arm, Patient Position: Sitting, Cuff Size: Normal)   Pulse 68   Wt 154 lb 12.8 oz (70.2 kg)   SpO2 96%   BMI 25.76 kg/m    Review of Systems Denies neck pain    Objective:   Physical Exam VITAL SIGNS:  See vs page GENERAL: no distress NECK: multinodular goiter is again noted (R>L).  No palpable lymphadenopathy at the anterior neck.    Lab Results  Component Value Date   TSH 4.44 12/22/2017       Assessment & Plan:  Nodular goiter: recheck today Hypothyroidism: goal is lower TSH, so we'll increase synthroid.    No avs due to computer problem

## 2018-03-29 MED FILL — LEVOTHYROXINE 88 MCG TABLET: 88 | 90 days supply | Qty: 90 | Fill #0

## 2018-03-31 ENCOUNTER — Encounter: Payer: Self-pay | Admitting: Family Medicine

## 2018-03-31 ENCOUNTER — Ambulatory Visit (INDEPENDENT_AMBULATORY_CARE_PROVIDER_SITE_OTHER): Payer: 59 | Admitting: Family Medicine

## 2018-03-31 VITALS — BP 132/68 | HR 67 | Temp 98.7°F | Ht 65.0 in | Wt 153.0 lb

## 2018-03-31 DIAGNOSIS — R05 Cough: Secondary | ICD-10-CM | POA: Diagnosis not present

## 2018-03-31 DIAGNOSIS — J00 Acute nasopharyngitis [common cold]: Secondary | ICD-10-CM

## 2018-03-31 DIAGNOSIS — R059 Cough, unspecified: Secondary | ICD-10-CM

## 2018-03-31 MED ORDER — DOXYCYCLINE HYCLATE 100 MG PO TABS: 100.0000 mg | ORAL_TABLET | Freq: Two times a day (BID) | ORAL | 0 refills | Status: DC

## 2018-03-31 MED ORDER — IPRATROPIUM BROMIDE 0.06 % NA SOLN: 2.0000 | Freq: Four times a day (QID) | NASAL | 1 refills | Status: DC

## 2018-03-31 NOTE — Progress Notes (Signed)
   Subjective:  Margaret Green is a 57 y.o. female who presents today for same-day appointment with a chief complaint of cough.   HPI:  Cough, Acute problem Started about a week ago. Worsened over that time. Associated with headache, rhinorrhea, and sneeze.Tried ibuprofen and zyrtec without significant improvement. Some subjective fevers. No chills. Had sick contact last week.  No other obvious alleviating or aggravating factors.  ROS: Per HPI  PMH: She reports that she has never smoked. She has never used smokeless tobacco. She reports that she drinks alcohol. She reports that she does not use drugs.  Objective:  Physical Exam: BP 132/68 (BP Location: Left Arm, Patient Position: Sitting, Cuff Size: Normal)   Pulse 67   Temp 98.7 F (37.1 C) (Oral)   Ht 5\' 5"  (1.651 m)   Wt 153 lb (69.4 kg)   SpO2 96%   BMI 25.46 kg/m   Gen: NAD, resting comfortably HEENT: TMs with clear effusion bilaterally.  Oropharynx erythematous without exudate. CV: RRR with no murmurs appreciated Pulm: NWOB, CTAB with no crackles, wheezes, or rhonchi   Assessment/Plan:  Cough/rhinitis Start atrovent for rhinorrhea/sinus congestion.  Given the symptoms have been persistent for greater than a week and are worsening, we will start empiric doxycycline today. Recommended tylenol and/or motrin as needed for low grade fever and Green. Encouraged good oral hydration. Return precautions reviewed. Follow up as needed.   Algis Greenhouse. Margaret Pain, MD 03/31/2018 1:16 PM

## 2018-03-31 NOTE — Patient Instructions (Signed)
Start the atrovent and doxycycline   Please stay well hydrated.  You can take tylenol and/or motrin as needed for low grade fever and pain.  Please let me know if your symptoms worsen or fail to improve.  Take care, Dr Parker  

## 2018-04-23 ENCOUNTER — Ambulatory Visit
Admission: RE | Admit: 2018-04-23 | Discharge: 2018-04-23 | Disposition: A | Discharge status: Home / Self Care | Payer: 59 | Source: Ambulatory Visit | Attending: Endocrinology | Admitting: Endocrinology

## 2018-04-23 DIAGNOSIS — E041 Nontoxic single thyroid nodule: Secondary | ICD-10-CM | POA: Diagnosis not present

## 2018-05-17 ENCOUNTER — Other Ambulatory Visit: Payer: Self-pay | Admitting: Internal Medicine

## 2018-05-17 MED FILL — ESCITALOPRAM 20 MG TABLET: 20 | 90 days supply | Qty: 90 | Fill #0

## 2018-05-18 ENCOUNTER — Encounter: Payer: BLUE CROSS/BLUE SHIELD | Admitting: Obstetrics & Gynecology

## 2018-05-23 ENCOUNTER — Other Ambulatory Visit (INDEPENDENT_AMBULATORY_CARE_PROVIDER_SITE_OTHER): Payer: Self-pay | Admitting: Orthopaedic Surgery

## 2018-05-24 MED FILL — DICLOFENAC SODIUM 75 MG TAB: 75 | 30 days supply | Qty: 30 | Fill #0

## 2018-05-30 ENCOUNTER — Encounter: Payer: BLUE CROSS/BLUE SHIELD | Admitting: Obstetrics & Gynecology

## 2018-06-18 ENCOUNTER — Encounter: Payer: Self-pay | Admitting: Internal Medicine

## 2018-06-19 MED ORDER — ROSUVASTATIN CALCIUM 20 MG PO TABS: 20.0000 mg | ORAL_TABLET | Freq: Every day | ORAL | 1 refills | Status: DC

## 2018-06-19 MED FILL — ROSUVASTATIN CALCIUM 20 MG: 20 | 90 days supply | Qty: 90 | Fill #0

## 2018-06-26 ENCOUNTER — Ambulatory Visit: Payer: 59 | Admitting: Internal Medicine

## 2018-06-27 ENCOUNTER — Ambulatory Visit: Payer: 59 | Admitting: Internal Medicine

## 2018-07-01 NOTE — Patient Instructions (Addendum)

## 2018-07-01 NOTE — Progress Notes (Signed)
Subjective:    Patient ID: Margaret Green, female    DOB: 12-10-1960, 57 y.o.   MRN: [id number]  HPI The patient is here for follow up.  Hypertension: She is taking her medication daily. She is compliant with a low sodium diet.  She denies chest pain, palpitations, edema, shortness of breath and regular headaches. She is walking a lot at work. No other exercising regularly.  She does not monitor her blood pressure at home.    Hyperlipidemia: She is taking her medication daily. She is compliant with a low fat/cholesterol diet. She is very active at work, but not exercising regularly. She denies myalgias.   Hypothyroidism: She is following with Dr. Loanne Drilling.  She is taking her medication daily.  She denies any recent changes in energy or weight that are unexplained.   B12 deficiency:  Her B12 level was low 6 months ago.  She has been taking B12 daily.  Depression: She is taking her Lexapro daily as prescribed. She denies any side effects from the medication. She feels her depression is well controlled and she is happy with her current dose of medication.   Anxiety: She is taking her medication daily as prescribed.  She has not needed the Ativan.  She denies any side effects from the medication. She feels her anxiety is well controlled and she is happy with her current dose of medication.    Medications and allergies reviewed with patient and updated if appropriate.  Patient Active Problem List   Diagnosis Date Noted  . Hyperglycemia 12/23/2017  . Moderate episode of recurrent major depressive disorder ([place]) 11/10/2017  . Osteopenia 08/10/2017  . Multinodular goiter 03/28/2017  . Chest pain with low risk for cardiac etiology 01/02/2017  . Dyspnea on exertion 01/02/2017  . Tachycardia with hypertension 01/02/2017  . Sjogren's disease ([place]) 08/27/2015  . Depression 07/23/2015  . Adenocarcinoma, colon ([place]) 07/23/2015  . Hoarseness 03/10/2015  . Migraine 08/14/2014  . Allergic  rhinitis 08/14/2014  . IBS (irritable bowel syndrome) 08/14/2014  . Essential hypertension, benign 08/14/2014  . GERD (gastroesophageal reflux disease) 08/14/2014  . Right thyroid nodule 09/24/2013  . GAD (generalized anxiety disorder) 09/29/2010  . ANEMIA, PERNICIOUS 02/24/2009  . Hyperlipidemia 04/13/2007  . RAYNAUD'S DISEASE 04/13/2007  . Osteoarthritis 04/13/2007  . Fibromyalgia 04/13/2007  . Hypothyroidism 04/03/2007    Current Outpatient Medications on File Prior to Visit  Medication Sig Dispense Refill  . Calcium Carbonate-Vitamin D (CALCIUM 600+D) 600-400 MG-UNIT tablet Take 1 tablet by mouth daily.    . diclofenac (VOLTAREN) 75 MG EC tablet TAKE 1 TABLET BY MOUTH TWO TIMES DAILY 30 tablet 1  . escitalopram (LEXAPRO) 20 MG tablet TAKE 1 TABLET BY MOUTH ONCE DAILY 90 tablet 0  . estradiol (VIVELLE-DOT) 0.05 MG/24HR patch Place 1 patch (0.05 mg total) onto the skin 2 (two) times a week. Pt places on Wednesday and Sunday. 24 patch 1  . ipratropium (ATROVENT) 0.06 % nasal spray Place 2 sprays into both nostrils 4 (four) times daily. 15 mL 1  . levothyroxine (SYNTHROID, LEVOTHROID) 88 MCG tablet Take 1 tablet (88 mcg total) by mouth daily before breakfast. 90 tablet 3  . rosuvastatin (CRESTOR) 20 MG tablet Take 1 tablet (20 mg total) by mouth daily. 90 tablet 1  . tiZANidine (ZANAFLEX) 4 MG tablet TAKE ONE TABLET BY MOUTH TWICE DAILY AS NEEDED MUSCLE SPASM 180 tablet 1   No current facility-administered medications on file prior to visit.     Past Medical  History:  Diagnosis Date  . Adenocarcinoma, colon ([place]) 07/23/2015   Removed completely by polypectomy 06/2015   . Allergy   . Anxiety   . Arthritis   . Endometriosis 08/19/2009   Hysterectomy.     . Family history of diabetes mellitus   . GERD (gastroesophageal reflux disease)   . Headache(784.0)   . Heart murmur    past hx of heart murmur  . Hyperlipidemia   . Hypothyroidism   . Neutropenia   . Raynaud's syndrome     . Sjogrens syndrome ([place])   . Stricture and stenosis of esophagus     Past Surgical History:  Procedure Laterality Date  . ABDOMINAL HYSTERECTOMY    . BREAST BIOPSY     benign  . BREAST REDUCTION SURGERY    . COLONOSCOPY    . HERNIA REPAIR    . POLYPECTOMY    . REDUCTION MAMMAPLASTY    . UPPER GASTROINTESTINAL ENDOSCOPY      Social History   Socioeconomic History  . Marital status: Single    Spouse name: Not on file  . Number of children: Not on file  . Years of education: Not on file  . Highest education level: Not on file  Occupational History  . Not on file  Social Needs  . Financial resource strain: Not on file  . Food insecurity:    Worry: Not on file    Inability: Not on file  . Transportation needs:    Medical: Not on file    Non-medical: Not on file  Tobacco Use  . Smoking status: Never Smoker  . Smokeless tobacco: Never Used  Substance and Sexual Activity  . Alcohol use: Yes    Alcohol/week: 0.0 standard drinks    Comment: occasionally  . Drug use: No  . Sexual activity: Yes  Lifestyle  . Physical activity:    Days per week: Not on file    Minutes per session: Not on file  . Stress: Not on file  Relationships  . Social connections:    Talks on phone: Not on file    Gets together: Not on file    Attends religious service: Not on file    Active member of club or organization: Not on file    Attends meetings of clubs or organizations: Not on file    Relationship status: Not on file  Other Topics Concern  . Not on file  Social History Narrative   Partner for 2 years in 2013. Prior 20 year relationship-still friends. No children. 1 dog-peekapoo.       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       Hobbies: golf, cooking, gardening, poker    Family History  Problem Relation Age of Onset  . Coronary artery disease Father        age 56 - 39 stents, all 3 siblings with early  cardiac disease  . Colon polyps Father   . Heart attack Father 21  . Heart disease Father   . Cirrhosis Mother        alcohol related  . Alcohol abuse Mother   . Ovarian cancer Maternal Aunt   . Stomach cancer Paternal Aunt   . Esophageal cancer Paternal Aunt   . Diabetes Paternal Grandmother        and aunt  . Colon polyps Sister   . Heart attack Paternal Uncle   . Colon cancer Neg Hx   .  Rectal cancer Neg Hx     Review of Systems  Constitutional: Negative for chills and fever.  Respiratory: Negative for cough, shortness of breath and wheezing.   Cardiovascular: Negative for chest pain, palpitations and leg swelling.  Neurological: Negative for light-headedness and headaches.       Objective:   Vitals:   07/02/18 1546  BP: 128/72  Pulse: (!) 56  Resp: 16  Temp: 98.4 F (36.9 C)  SpO2: 98%   BP Readings from Last 3 Encounters:  07/02/18 128/72  03/31/18 132/68  03/28/18 110/70   Wt Readings from Last 3 Encounters:  07/02/18 174 lb (78.9 kg)  03/31/18 153 lb (69.4 kg)  03/28/18 154 lb 12.8 oz (70.2 kg)   Body mass index is 28.96 kg/m.   Physical Exam    Constitutional: Appears well-developed and well-nourished. No distress.  HENT:  Head: Normocephalic and atraumatic.  Neck: Neck supple. No tracheal deviation present. No thyromegaly present.  No cervical lymphadenopathy Cardiovascular: Normal rate, regular rhythm and normal heart sounds.   No murmur heard. No carotid bruit .  No edema Pulmonary/Chest: Effort normal and breath sounds normal. No respiratory distress. No has no wheezes. No rales.  Skin: Skin is warm and dry. Not diaphoretic.  Psychiatric: Normal mood and affect. Behavior is normal.      Assessment & Plan:    See Problem List for Assessment and Plan of chronic medical problems.

## 2018-07-02 ENCOUNTER — Ambulatory Visit: Payer: 59 | Admitting: Internal Medicine

## 2018-07-02 ENCOUNTER — Other Ambulatory Visit (INDEPENDENT_AMBULATORY_CARE_PROVIDER_SITE_OTHER): Payer: 59

## 2018-07-02 ENCOUNTER — Encounter: Payer: Self-pay | Admitting: Internal Medicine

## 2018-07-02 ENCOUNTER — Other Ambulatory Visit: Payer: Self-pay | Admitting: Internal Medicine

## 2018-07-02 VITALS — BP 128/72 | HR 56 | Temp 98.4°F | Resp 16 | Ht 65.0 in | Wt 174.0 lb

## 2018-07-02 DIAGNOSIS — F411 Generalized anxiety disorder: Secondary | ICD-10-CM

## 2018-07-02 DIAGNOSIS — M797 Fibromyalgia: Secondary | ICD-10-CM | POA: Diagnosis not present

## 2018-07-02 DIAGNOSIS — E538 Deficiency of other specified B group vitamins: Secondary | ICD-10-CM

## 2018-07-02 DIAGNOSIS — E7849 Other hyperlipidemia: Secondary | ICD-10-CM | POA: Diagnosis not present

## 2018-07-02 DIAGNOSIS — E038 Other specified hypothyroidism: Secondary | ICD-10-CM

## 2018-07-02 DIAGNOSIS — I10 Essential (primary) hypertension: Secondary | ICD-10-CM | POA: Diagnosis not present

## 2018-07-02 DIAGNOSIS — F331 Major depressive disorder, recurrent, moderate: Secondary | ICD-10-CM | POA: Diagnosis not present

## 2018-07-02 DIAGNOSIS — Z1231 Encounter for screening mammogram for malignant neoplasm of breast: Secondary | ICD-10-CM

## 2018-07-02 LAB — COMPREHENSIVE METABOLIC PANEL
ALBUMIN: 4.1 g/dL (ref 3.5–5.2)
ALK PHOS: 55 U/L (ref 39–117)
ALT: 16 U/L (ref 0–35)
AST: 17 U/L (ref 0–37)
BILIRUBIN TOTAL: 0.3 mg/dL (ref 0.2–1.2)
BUN: 18 mg/dL (ref 6–23)
CALCIUM: 9.7 mg/dL (ref 8.4–10.5)
CO2: 31 mEq/L (ref 19–32)
Chloride: 103 mEq/L (ref 96–112)
Creatinine, Ser: 0.72 mg/dL (ref 0.40–1.20)
GFR: 88.9 mL/min (ref >60.00)
Glucose, Bld: 84 mg/dL (ref 70–99)
Potassium: 3.7 mEq/L (ref 3.5–5.1)
Sodium: 137 mEq/L (ref 135–145)
TOTAL PROTEIN: 7.4 g/dL (ref 6.0–8.3)

## 2018-07-02 LAB — LIPID PANEL
CHOLESTEROL: 170 mg/dL (ref 0–200)
HDL: 51.2 mg/dL (ref >39.00)
NonHDL: 118.93
TRIGLYCERIDES: 205 mg/dL — AB (ref 0.0–149.0)
Total CHOL/HDL Ratio: 3
VLDL: 41 mg/dL — ABNORMAL HIGH (ref 0.0–40.0)

## 2018-07-02 LAB — VITAMIN B12: Vitamin B-12: 448 pg/mL (ref 211–911)

## 2018-07-02 LAB — LDL CHOLESTEROL, DIRECT: LDL DIRECT: 102 mg/dL

## 2018-07-02 MED ORDER — LOSARTAN POTASSIUM 50 MG PO TABS: 50.0000 mg | ORAL_TABLET | Freq: Every day | ORAL | 1 refills | Status: DC

## 2018-07-02 MED FILL — LOSARTAN POTASSIUM 50 MG TA: 50 | 90 days supply | Qty: 90 | Fill #0

## 2018-07-02 NOTE — Assessment & Plan Note (Signed)
Taking B12 daily Will check level and monitor on oral supplementation Will only do injections if needed

## 2018-07-02 NOTE — Assessment & Plan Note (Signed)
Management per Dr. Ellison 

## 2018-07-02 NOTE — Assessment & Plan Note (Signed)
Has fibromyalgia pain that sometimes wakes her up at night Takes tizanidine 4 mg at night as needed Is very active at work and walks about 4 miles a day-continue

## 2018-07-02 NOTE — Assessment & Plan Note (Signed)
Currently controlled Continue Lexapro 20 mg daily Follow-up in 6 months

## 2018-07-02 NOTE — Assessment & Plan Note (Signed)
BP well controlled Current regimen effective and well tolerated Continue current medications at current doses cmp  

## 2018-07-02 NOTE — Assessment & Plan Note (Signed)
Taking her cholesterol medication daily Check lipid panel, CMP Continue increase activity

## 2018-07-05 ENCOUNTER — Encounter: Payer: Self-pay | Admitting: Internal Medicine

## 2018-07-11 ENCOUNTER — Encounter: Payer: Self-pay | Admitting: Internal Medicine

## 2018-07-13 ENCOUNTER — Encounter: Payer: Self-pay | Admitting: Internal Medicine

## 2018-07-17 ENCOUNTER — Ambulatory Visit: Payer: Self-pay

## 2018-07-17 ENCOUNTER — Ambulatory Visit: Payer: 59 | Admitting: Internal Medicine

## 2018-07-17 ENCOUNTER — Encounter: Payer: Self-pay | Admitting: Internal Medicine

## 2018-07-17 VITALS — BP 120/72 | HR 65 | Temp 98.2°F | Resp 16 | Ht 65.0 in | Wt 176.4 lb

## 2018-07-17 DIAGNOSIS — R42 Dizziness and giddiness: Secondary | ICD-10-CM | POA: Diagnosis not present

## 2018-07-17 MED ORDER — DIAZEPAM 2 MG PO TABS: 2.0000 mg | ORAL_TABLET | Freq: Three times a day (TID) | ORAL | 0 refills | Status: DC | PRN

## 2018-07-17 MED ORDER — MECLIZINE HCL 12.5 MG PO TABS: 12.5000 mg | ORAL_TABLET | Freq: Three times a day (TID) | ORAL | 0 refills | Status: DC | PRN

## 2018-07-17 MED ORDER — PREDNISONE 20 MG PO TABS: 20.0000 mg | ORAL_TABLET | Freq: Every day | ORAL | 0 refills | Status: DC

## 2018-07-17 MED FILL — diazePAM 2 MG TABS: 2 | 3 days supply | Qty: 15 | Fill #0

## 2018-07-17 MED FILL — predniSONE 20 MG TABS: 20 | 5 days supply | Qty: 5 | Fill #0

## 2018-07-17 NOTE — Patient Instructions (Addendum)
Take prednisone 20 mg daily with breakfast for 5 days.  Stop early for stomach upset.    Take meclizine or valium as needed for the dizziness.    Call if no improvement     Vertigo Vertigo is the feeling that you or your surroundings are moving when they are not. Vertigo can be dangerous if it occurs while you are doing something that could endanger you or others, such as driving. What are the causes? This condition is caused by a disturbance in the signals that are sent by your body's sensory systems to your brain. Different causes of a disturbance can lead to vertigo, including:  Infections, especially in the inner ear.  A bad reaction to a drug, or misuse of alcohol and medicines.  Withdrawal from drugs or alcohol.  Quickly changing positions, as when lying down or rolling over in bed.  Migraine headaches.  Decreased blood flow to the brain.  Decreased blood pressure.  Increased pressure in the brain from a head or neck injury, stroke, infection, tumor, or bleeding.  Central nervous system disorders.  What are the signs or symptoms? Symptoms of this condition usually occur when you move your head or your eyes in different directions. Symptoms may start suddenly, and they usually last for less than a minute. Symptoms may include:  Loss of balance and falling.  Feeling like you are spinning or moving.  Feeling like your surroundings are spinning or moving.  Nausea and vomiting.  Blurred vision or double vision.  Difficulty hearing.  Slurred speech.  Dizziness.  Involuntary eye movement (nystagmus).  Symptoms can be mild and cause only slight annoyance, or they can be severe and interfere with daily life. Episodes of vertigo may return (recur) over time, and they are often triggered by certain movements. Symptoms may improve over time. How is this diagnosed? This condition may be diagnosed based on medical history and the quality of your nystagmus. Your health  care provider may test your eye movements by asking you to quickly change positions to trigger the nystagmus. This may be called the Dix-Hallpike test, head thrust test, or roll test. You may be referred to a health care provider who specializes in ear, nose, and throat (ENT) problems (otolaryngologist) or a provider who specializes in disorders of the central nervous system (neurologist). You may have additional testing, including:  A physical exam.  Blood tests.  MRI.  A CT scan.  An electrocardiogram (ECG). This records electrical activity in your heart.  An electroencephalogram (EEG). This records electrical activity in your brain.  Hearing tests.  How is this treated? Treatment for this condition depends on the cause and the severity of the symptoms. Treatment options include:  Medicines to treat nausea or vertigo. These are usually used for severe cases. Some medicines that are used to treat other conditions may also reduce or eliminate vertigo symptoms. These include: ? Medicines that control allergies (antihistamines). ? Medicines that control seizures (anticonvulsants). ? Medicines that relieve depression (antidepressants). ? Medicines that relieve anxiety (sedatives).  Head movements to adjust your inner ear back to normal. If your vertigo is caused by an ear problem, your health care provider may recommend certain movements to correct the problem.  Surgery. This is rare.  Follow these instructions at home: Safety  Move slowly.Avoid sudden body or head movements.  Avoid driving.  Avoid operating heavy machinery.  Avoid doing any tasks that would cause danger to you or others if you would have a vertigo episode  during the task.  If you have trouble walking or keeping your balance, try using a cane for stability. If you feel dizzy or unstable, sit down right away.  Return to your normal activities as told by your health care provider. Ask your health care provider  what activities are safe for you. General instructions  Take over-the-counter and prescription medicines only as told by your health care provider.  Avoid certain positions or movements as told by your health care provider.  Drink enough fluid to keep your urine clear or pale yellow.  Keep all follow-up visits as told by your health care provider. This is important. Contact a health care provider if:  Your medicines do not relieve your vertigo or they make it worse.  You have a fever.  Your condition gets worse or you develop new symptoms.  Your family or friends notice any behavioral changes.  Your nausea or vomiting gets worse.  You have numbness or a "pins and needles" sensation in part of your body. Get help right away if:  You have difficulty moving or speaking.  You are always dizzy.  You faint.  You develop severe headaches.  You have weakness in your hands, arms, or legs.  You have changes in your hearing or vision.  You develop a stiff neck.  You develop sensitivity to light. This information is not intended to replace advice given to you by your health care provider. Make sure you discuss any questions you have with your health care provider. Document Released: 06/08/2005 Document Revised: 02/10/2016 Document Reviewed: 12/22/2014 Elsevier Interactive Patient Education  [employer].

## 2018-07-17 NOTE — Assessment & Plan Note (Signed)
Likely peripheral-BPPV or labyrinthitis Discussed trying meclizine or Valium for symptom relief-may need to just take these at night to help her sleep and so that they do not make her drowsy during the day-she will only take 1 of these, but will see which one works best Prednisone 20 mg daily for 5 days-she will take this with breakfast and if it causes stomach upset or too much insomnia she will discontinue it early-hopefully this will help with some labyrinthitis, mild sinusitis Can consider PT if no improvement She will update me via MyChart

## 2018-07-17 NOTE — Progress Notes (Signed)
Subjective:    Patient ID: Margaret Green, female    DOB: 02-07-1961, 57 y.o.   MRN: [id number]  HPI The patient is here for an acute visit.   Dizziness:  It started a little over a week ago.  Dr Benjamine Mola looked in her ears at work and stated they appeared normal.  He recommended she see me and if her symptoms continued he could see her in the office.    She states a sensation of her head spinning but her surroundings or not.  It only occurs when she bends over or turns her head.  The spinning sensation or vertigo is a transient sensation.  She denies any lightheadedness, nausea or headaches.  Her symptoms are intermittent throughout the day.  She does state some mild congestion, but that is not unusual for her.  She has had some intermittent right ear pain and pressure.  She has not had any fevers, chills, sinus pain, sore throat, coughing, and shortness of breath.  She denies any focal weakness, numbness or tingling.    Medications and allergies reviewed with patient and updated if appropriate.  Patient Active Problem List   Diagnosis Date Noted  . B12 deficiency 07/02/2018  . Hyperglycemia 12/23/2017  . Moderate episode of recurrent major depressive disorder ([place]) 11/10/2017  . Osteopenia 08/10/2017  . Multinodular goiter 03/28/2017  . Chest pain with low risk for cardiac etiology 01/02/2017  . Tachycardia with hypertension 01/02/2017  . Sjogren's disease ([place]) 08/27/2015  . Depression 07/23/2015  . Adenocarcinoma, colon ([place]) 07/23/2015  . Migraine 08/14/2014  . Allergic rhinitis 08/14/2014  . IBS (irritable bowel syndrome) 08/14/2014  . Essential hypertension, benign 08/14/2014  . GERD (gastroesophageal reflux disease) 08/14/2014  . Right thyroid nodule 09/24/2013  . GAD (generalized anxiety disorder) 09/29/2010  . ANEMIA, PERNICIOUS 02/24/2009  . Hyperlipidemia 04/13/2007  . RAYNAUD'S DISEASE 04/13/2007  . Osteoarthritis 04/13/2007  . Fibromyalgia 04/13/2007  .  Hypothyroidism 04/03/2007    Current Outpatient Medications on File Prior to Visit  Medication Sig Dispense Refill  . Calcium Carbonate-Vitamin D (CALCIUM 600+D) 600-400 MG-UNIT tablet Take 1 tablet by mouth daily.    . diclofenac (VOLTAREN) 75 MG EC tablet TAKE 1 TABLET BY MOUTH TWO TIMES DAILY 30 tablet 1  . escitalopram (LEXAPRO) 20 MG tablet TAKE 1 TABLET BY MOUTH ONCE DAILY 90 tablet 0  . estradiol (VIVELLE-DOT) 0.05 MG/24HR patch Place 1 patch (0.05 mg total) onto the skin 2 (two) times a week. Pt places on Wednesday and Sunday. 24 patch 1  . ipratropium (ATROVENT) 0.06 % nasal spray Place 2 sprays into both nostrils 4 (four) times daily. 15 mL 1  . levothyroxine (SYNTHROID, LEVOTHROID) 88 MCG tablet Take 1 tablet (88 mcg total) by mouth daily before breakfast. 90 tablet 3  . losartan (COZAAR) 50 MG tablet Take 1 tablet (50 mg total) by mouth daily. 90 tablet 1  . rosuvastatin (CRESTOR) 20 MG tablet Take 1 tablet (20 mg total) by mouth daily. 90 tablet 1  . tiZANidine (ZANAFLEX) 4 MG tablet TAKE ONE TABLET BY MOUTH TWICE DAILY AS NEEDED MUSCLE SPASM 180 tablet 1   No current facility-administered medications on file prior to visit.     Past Medical History:  Diagnosis Date  . Adenocarcinoma, colon ([place]) 07/23/2015   Removed completely by polypectomy 06/2015   . Allergy   . Anxiety   . Arthritis   . Endometriosis 08/19/2009   Hysterectomy.     . Family history of  diabetes mellitus   . GERD (gastroesophageal reflux disease)   . Headache(784.0)   . Heart murmur    past hx of heart murmur  . Hyperlipidemia   . Hypothyroidism   . Neutropenia   . Raynaud's syndrome   . Sjogrens syndrome ([place])   . Stricture and stenosis of esophagus     Past Surgical History:  Procedure Laterality Date  . ABDOMINAL HYSTERECTOMY    . BREAST BIOPSY     benign  . BREAST REDUCTION SURGERY    . COLONOSCOPY    . HERNIA REPAIR    . POLYPECTOMY    . REDUCTION MAMMAPLASTY    . UPPER  GASTROINTESTINAL ENDOSCOPY      Social History   Socioeconomic History  . Marital status: Single    Spouse name: Not on file  . Number of children: Not on file  . Years of education: Not on file  . Highest education level: Not on file  Occupational History  . Not on file  Social Needs  . Financial resource strain: Not on file  . Food insecurity:    Worry: Not on file    Inability: Not on file  . Transportation needs:    Medical: Not on file    Non-medical: Not on file  Tobacco Use  . Smoking status: Never Smoker  . Smokeless tobacco: Never Used  Substance and Sexual Activity  . Alcohol use: Yes    Alcohol/week: 0.0 standard drinks    Comment: occasionally  . Drug use: No  . Sexual activity: Yes  Lifestyle  . Physical activity:    Days per week: Not on file    Minutes per session: Not on file  . Stress: Not on file  Relationships  . Social connections:    Talks on phone: Not on file    Gets together: Not on file    Attends religious service: Not on file    Active member of club or organization: Not on file    Attends meetings of clubs or organizations: Not on file    Relationship status: Not on file  Other Topics Concern  . Not on file  Social History Narrative   Partner for 2 years in 2013. Prior 20 year relationship-still friends. No children. 1 dog-peekapoo.       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       Hobbies: golf, cooking, gardening, poker    Family History  Problem Relation Age of Onset  . Coronary artery disease Father        age 67 - 13 stents, all 3 siblings with early cardiac disease  . Colon polyps Father   . Heart attack Father 44  . Heart disease Father   . Cirrhosis Mother        alcohol related  . Alcohol abuse Mother   . Ovarian cancer Maternal Aunt   . Stomach cancer Paternal Aunt   . Esophageal cancer Paternal Aunt   . Diabetes Paternal Grandmother        and  aunt  . Colon polyps Sister   . Heart attack Paternal Uncle   . Colon cancer Neg Hx   . Rectal cancer Neg Hx     Review of Systems  Constitutional: Negative for chills and fever.  HENT: Positive for congestion (mild). Negative for ear pain (right ear had a little pressure intermittent), sinus pain and sore throat.   Respiratory: Negative for cough,  shortness of breath and wheezing.   Gastrointestinal: Negative for nausea.  Neurological: Positive for dizziness. Negative for weakness, light-headedness, numbness and headaches.       Objective:   Vitals:   07/17/18 1527  BP: 120/72  Pulse: 65  Resp: 16  Temp: 98.2 F (36.8 C)  SpO2: 96%   BP Readings from Last 3 Encounters:  07/17/18 120/72  07/02/18 128/72  03/31/18 132/68   Wt Readings from Last 3 Encounters:  07/17/18 176 lb 6.4 oz (80 kg)  07/02/18 174 lb (78.9 kg)  03/31/18 153 lb (69.4 kg)   Body mass index is 29.35 kg/m.   Physical Exam    GENERAL APPEARANCE: Appears stated age, well appearing, NAD EYES: conjunctiva clear, no icterus HEENT: bilateral tympanic membranes and ear canals normal, oropharynx with no erythema,, no maxillary sinus tenderness bilaterally, no thyromegaly, trachea midline, no cervical or supraclavicular lymphadenopathy LUNGS: Clear to auscultation without wheeze or crackles, unlabored breathing, good air entry bilaterally CARDIOVASCULAR: Normal S1,S2 without murmurs, no edema Neuro:  CN II-Xii intact, normal sensation and strength all extremities, gait normal SKIN: Warm, dry      Assessment & Plan:    See Problem List for Assessment and Plan of chronic medical problems.

## 2018-07-17 NOTE — Telephone Encounter (Signed)
Pt. Reports dizziness x 1 week. States she does a lot of bending over at work and thinks it could be related. Gets lightheaded. Denies vertigo. States she had her VS checked at work and "they were fine." Denies any other symptoms. Appointment made for today with her provider. Reason for Disposition . [1] MODERATE dizziness (e.g., interferes with normal activities) AND [2] has NOT been evaluated by physician for this  (Exception: dizziness caused by heat exposure, sudden standing, or poor fluid intake)  Answer Assessment - Initial Assessment Questions 1. DESCRIPTION: "Describe your dizziness."     Dizzy 2. LIGHTHEADED: "Do you feel lightheaded?" (e.g., somewhat faint, woozy, weak upon standing)     Yes 3. VERTIGO: "Do you feel like either you or the room is spinning or tilting?" (i.e. vertigo)     No 4. SEVERITY: "How bad is it?"  "Do you feel like you are going to faint?" "Can you stand and walk?"   - MILD - walking normally   - MODERATE - interferes with normal activities (e.g., work, school)    - SEVERE - unable to stand, requires support to walk, feels like passing out now.      Mild 5. ONSET:  "When did the dizziness begin?"     1 week ago 6. AGGRAVATING FACTORS: "Does anything make it worse?" (e.g., standing, change in head position)     Bending over and movement 7. HEART RATE: "Can you tell me your heart rate?" "How many beats in 15 seconds?"  (Note: not all patients can do this)       Pulse was 78 8. CAUSE: "What do you think is causing the dizziness?"     Unsure 9. RECURRENT SYMPTOM: "Have you had dizziness before?" If so, ask: "When was the last time?" "What happened that time?"     No 10. OTHER SYMPTOMS: "Do you have any other symptoms?" (e.g., fever, chest pain, vomiting, diarrhea, bleeding)       No 11. PREGNANCY: "Is there any chance you are pregnant?" "When was your last menstrual period?"       No  Protocols used: DIZZINESS [facility]

## 2018-07-17 NOTE — Telephone Encounter (Signed)
Seeing you at 3:30 today.

## 2018-08-13 MED FILL — DEXAMETHASONE 4 MG TABLET: 4 | 3 days supply | Qty: 9 | Fill #0

## 2018-08-13 MED FILL — CHLORHEXIDINE 0.12% RINSE: 0.12 | 16 days supply | Qty: 473 | Fill #0

## 2018-08-13 MED FILL — IBUPROFEN 400 MG TABS: 400 | 8 days supply | Qty: 30 | Fill #0

## 2018-08-14 ENCOUNTER — Ambulatory Visit: Payer: 59

## 2018-08-20 ENCOUNTER — Encounter: Payer: 59 | Admitting: Obstetrics & Gynecology

## 2018-08-20 ENCOUNTER — Encounter: Payer: Self-pay | Admitting: Internal Medicine

## 2018-08-23 ENCOUNTER — Encounter: Payer: Self-pay | Admitting: Internal Medicine

## 2018-08-23 MED ORDER — FLUCONAZOLE 150 MG PO TABS: 150.0000 mg | ORAL_TABLET | Freq: Once | ORAL | 0 refills | Status: AC

## 2018-08-30 ENCOUNTER — Other Ambulatory Visit: Payer: Self-pay | Admitting: Internal Medicine

## 2018-08-30 MED FILL — ESCITALOPRAM 20 MG TABLET: 20 | 90 days supply | Qty: 90 | Fill #0

## 2018-09-10 MED FILL — tiZANidine HCL 4 MG TABS: 4 | 90 days supply | Qty: 180 | Fill #1

## 2018-09-11 MED FILL — ESTRADIOL 0.05 MG PATCH: 0.05 | 84 days supply | Qty: 24 | Fill #1

## 2018-09-20 ENCOUNTER — Ambulatory Visit
Admission: RE | Admit: 2018-09-20 | Discharge: 2018-09-20 | Disposition: A | Discharge status: Home / Self Care | Payer: 59 | Source: Ambulatory Visit | Attending: Internal Medicine | Admitting: Internal Medicine

## 2018-09-20 ENCOUNTER — Encounter: Payer: Self-pay | Admitting: Internal Medicine

## 2018-09-20 DIAGNOSIS — Z1231 Encounter for screening mammogram for malignant neoplasm of breast: Secondary | ICD-10-CM

## 2018-09-21 ENCOUNTER — Other Ambulatory Visit: Payer: Self-pay | Admitting: Internal Medicine

## 2018-09-21 DIAGNOSIS — N63 Unspecified lump in unspecified breast: Secondary | ICD-10-CM

## 2018-09-28 ENCOUNTER — Encounter: Payer: Self-pay | Admitting: Internal Medicine

## 2018-10-04 MED FILL — ROSUVASTATIN CALCIUM 20 MG: 20 | 90 days supply | Qty: 90 | Fill #1

## 2018-10-05 ENCOUNTER — Ambulatory Visit: Payer: 59

## 2018-10-05 ENCOUNTER — Ambulatory Visit
Admission: RE | Admit: 2018-10-05 | Discharge: 2018-10-05 | Disposition: A | Discharge status: Home / Self Care | Payer: 59 | Source: Ambulatory Visit | Attending: Internal Medicine | Admitting: Internal Medicine

## 2018-10-05 DIAGNOSIS — N63 Unspecified lump in unspecified breast: Secondary | ICD-10-CM

## 2018-10-05 DIAGNOSIS — R928 Other abnormal and inconclusive findings on diagnostic imaging of breast: Secondary | ICD-10-CM | POA: Diagnosis not present

## 2018-10-15 MED FILL — LEVOTHYROXINE 88 MCG TABLET: 88 | 90 days supply | Qty: 90 | Fill #1

## 2018-10-15 MED FILL — LOSARTAN POTASSIUM 50 MG TA: 50 | 90 days supply | Qty: 90 | Fill #1

## 2018-10-24 ENCOUNTER — Encounter: Payer: Self-pay | Admitting: Internal Medicine

## 2018-10-25 MED ORDER — PHENTERMINE HCL 37.5 MG PO CAPS: 37.5000 mg | ORAL_CAPSULE | ORAL | 2 refills | Status: DC

## 2018-10-25 MED FILL — PHENTERMINE 37.5 MG TABLET: 37.5 | 30 days supply | Qty: 30 | Fill #0

## 2018-10-30 DIAGNOSIS — L814 Other melanin hyperpigmentation: Secondary | ICD-10-CM | POA: Diagnosis not present

## 2018-10-30 DIAGNOSIS — D1801 Hemangioma of skin and subcutaneous tissue: Secondary | ICD-10-CM | POA: Diagnosis not present

## 2018-10-30 DIAGNOSIS — L821 Other seborrheic keratosis: Secondary | ICD-10-CM | POA: Diagnosis not present

## 2018-11-23 MED FILL — PHENTERMINE 37.5 MG TABLET: 37.5 | 30 days supply | Qty: 30 | Fill #1 | Status: TO

## 2018-11-29 MED FILL — ESCITALOPRAM 20 MG TABLET: 20 | 90 days supply | Qty: 90 | Fill #1

## 2018-12-06 ENCOUNTER — Encounter: Payer: 59 | Admitting: Obstetrics & Gynecology

## 2018-12-14 ENCOUNTER — Encounter: Payer: Self-pay | Admitting: Internal Medicine

## 2018-12-24 MED FILL — PHENTERMINE 37.5 MG TABLET: 37.5 | 30 days supply | Qty: 30 | Fill #0

## 2019-01-01 ENCOUNTER — Encounter: Payer: 59 | Admitting: Internal Medicine

## 2019-01-10 ENCOUNTER — Telehealth: Payer: Self-pay | Admitting: *Deleted

## 2019-01-10 MED ORDER — ESTRADIOL 0.05 MG/24HR TD PTTW: 1.0000 | MEDICATED_PATCH | TRANSDERMAL | 0 refills | Status: DC

## 2019-01-10 MED FILL — DOTTI 0.05 MG/24HR PTTW: 0.05 | 84 days supply | Qty: 24 | Fill #0

## 2019-01-10 NOTE — Telephone Encounter (Signed)
Patient now rescheduled annual exam due to COVID-19, vivelle dot 0.05 mg, annual now scheduled on 02/22/19.

## 2019-01-14 ENCOUNTER — Encounter: Payer: 59 | Admitting: Obstetrics & Gynecology

## 2019-01-22 ENCOUNTER — Other Ambulatory Visit: Payer: Self-pay | Admitting: Internal Medicine

## 2019-01-23 MED FILL — LOSARTAN POTASSIUM 50 MG TA: 50 | 30 days supply | Qty: 30 | Fill #0

## 2019-01-23 MED FILL — ROSUVASTATIN CALCIUM 20 MG: 20 | 30 days supply | Qty: 30 | Fill #0

## 2019-01-23 MED FILL — tiZANidine HCL 4 MG TABS: 4 | 30 days supply | Qty: 60 | Fill #0

## 2019-02-01 ENCOUNTER — Encounter: Payer: 59 | Admitting: Internal Medicine

## 2019-02-01 DIAGNOSIS — K011 Impacted teeth: Secondary | ICD-10-CM | POA: Diagnosis not present

## 2019-02-04 NOTE — Progress Notes (Signed)
Subjective:    Patient ID: Margaret Green, female    DOB: [birth year], 58 y.o.   MRN: [id number]  HPI She is here for a physical exam.   Her sjogren's is worse - she has some dental issues and needs to get back on medication.  She has lost 5 teeth in the past year or so.    She tried phentermine.  She still wants to have something to help her loose.  She gets 10,000- 12,000 steps a day.  She brings her lunch.  She does not eat a lot of junk.  She does not eat a balanced meal - she tends to eat things that are cheap - cheese and bread three nights a week.    Medications and allergies reviewed with patient and updated if appropriate.  Patient Active Problem List   Diagnosis Date Noted  . Vertigo 07/17/2018  . B12 deficiency 07/02/2018  . Hyperglycemia 12/23/2017  . Osteopenia 08/10/2017  . Multinodular goiter 03/28/2017  . Chest pain with low risk for cardiac etiology 01/02/2017  . Tachycardia with hypertension 01/02/2017  . Sjogren's disease ([place]) 08/27/2015  . Depression 07/23/2015  . Adenocarcinoma, colon ([place]) 07/23/2015  . Migraine 08/14/2014  . Allergic rhinitis 08/14/2014  . IBS (irritable bowel syndrome) 08/14/2014  . Essential hypertension, benign 08/14/2014  . GERD (gastroesophageal reflux disease) 08/14/2014  . Right thyroid nodule 09/24/2013  . GAD (generalized anxiety disorder) 09/29/2010  . ANEMIA, PERNICIOUS 02/24/2009  . Hyperlipidemia 04/13/2007  . RAYNAUD'S DISEASE 04/13/2007  . Osteoarthritis 04/13/2007  . Fibromyalgia 04/13/2007  . Hypothyroidism 04/03/2007    Current Outpatient Medications on File Prior to Visit  Medication Sig Dispense Refill  . Calcium Carbonate-Vitamin D (CALCIUM 600+D) 600-400 MG-UNIT tablet Take 1 tablet by mouth daily.    . diazepam (VALIUM) 2 MG tablet Take 1-2 tablets (2-4 mg total) by mouth every 8 (eight) hours as needed for anxiety. 15 tablet 0  . escitalopram (LEXAPRO) 20 MG tablet TAKE 1 TABLET BY MOUTH ONCE DAILY 30  tablet 0  . estradiol (VIVELLE-DOT) 0.05 MG/24HR patch Place 1 patch (0.05 mg total) onto the skin 2 (two) times a week. Pt places on Wednesday and Sunday. 24 patch 0  . ipratropium (ATROVENT) 0.06 % nasal spray Place 2 sprays into both nostrils 4 (four) times daily. 15 mL 1  . levothyroxine (SYNTHROID, LEVOTHROID) 88 MCG tablet Take 1 tablet (88 mcg total) by mouth daily before breakfast. 90 tablet 3  . losartan (COZAAR) 50 MG tablet TAKE 1 TABLET (50 MG TOTAL) BY MOUTH DAILY. 30 tablet 0  . meclizine (ANTIVERT) 12.5 MG tablet Take 1-2 tablets (12.5-25 mg total) by mouth 3 (three) times daily as needed for dizziness. 30 tablet 0  . phentermine 37.5 MG capsule Take 1 capsule (37.5 mg total) by mouth every morning. 30 capsule 2  . rosuvastatin (CRESTOR) 20 MG tablet TAKE 1 TABLET (20 MG TOTAL) BY MOUTH DAILY. 30 tablet 0  . tiZANidine (ZANAFLEX) 4 MG tablet TAKE 1 TABLET BY MOUTH TWICE DAILY AS NEEDED MUSCLE SPASM 60 tablet 0   No current facility-administered medications on file prior to visit.     Past Medical History:  Diagnosis Date  . Adenocarcinoma, colon ([place]) 07/23/2015   Removed completely by polypectomy 06/2015   . Allergy   . Anxiety   . Arthritis   . Endometriosis 08/19/2009   Hysterectomy.     . Family history of diabetes mellitus   . GERD (gastroesophageal reflux disease)   .  Headache(784.0)   . Heart murmur    past hx of heart murmur  . Hyperlipidemia   . Hypothyroidism   . Neutropenia   . Raynaud's syndrome   . Sjogrens syndrome ([place])   . Stricture and stenosis of esophagus     Past Surgical History:  Procedure Laterality Date  . ABDOMINAL HYSTERECTOMY    . BREAST BIOPSY     benign  . BREAST REDUCTION SURGERY    . COLONOSCOPY    . HERNIA REPAIR    . POLYPECTOMY    . REDUCTION MAMMAPLASTY    . UPPER GASTROINTESTINAL ENDOSCOPY      Social History   Socioeconomic History  . Marital status: Single    Spouse name: Not on file  . Number of children: Not on  file  . Years of education: Not on file  . Highest education level: Not on file  Occupational History  . Not on file  Social Needs  . Financial resource strain: Not on file  . Food insecurity:    Worry: Not on file    Inability: Not on file  . Transportation needs:    Medical: Not on file    Non-medical: Not on file  Tobacco Use  . Smoking status: Never Smoker  . Smokeless tobacco: Never Used  Substance and Sexual Activity  . Alcohol use: Yes    Alcohol/week: 0.0 standard drinks    Comment: occasionally  . Drug use: No  . Sexual activity: Yes  Lifestyle  . Physical activity:    Days per week: Not on file    Minutes per session: Not on file  . Stress: Not on file  Relationships  . Social connections:    Talks on phone: Not on file    Gets together: Not on file    Attends religious service: Not on file    Active member of club or organization: Not on file    Attends meetings of clubs or organizations: Not on file    Relationship status: Not on file  Other Topics Concern  . Not on file  Social History Narrative   Partner for 2 years in 2013. Prior 20 year relationship-still friends. No children. 1 dog-peekapoo.       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       Hobbies: golf, cooking, gardening, poker    Family History  Problem Relation Age of Onset  . Coronary artery disease Father        age 12 - 46 stents, all 3 siblings with early cardiac disease  . Colon polyps Father   . Heart attack Father 36  . Heart disease Father   . Cirrhosis Mother        alcohol related  . Alcohol abuse Mother   . Ovarian cancer Maternal Aunt   . Stomach cancer Paternal Aunt   . Esophageal cancer Paternal Aunt   . Diabetes Paternal Grandmother        and aunt  . Colon polyps Sister   . Heart attack Paternal Uncle   . Colon cancer Neg Hx   . Rectal cancer Neg Hx     Review of Systems  Constitutional: Negative  for chills and fever.  Eyes: Negative for visual disturbance.  Respiratory: Negative for cough, shortness of breath and wheezing.   Cardiovascular: Negative for chest pain, palpitations and leg swelling.  Gastrointestinal: Positive for abdominal pain (IBS cramping on occasion). Negative for blood  in stool, constipation, diarrhea and nausea.       No gerd  Genitourinary: Negative for dysuria and hematuria.  Musculoskeletal: Positive for arthralgias.  Skin: Negative for color change and rash.  Neurological: Positive for headaches (sinus). Negative for light-headedness.  Psychiatric/Behavioral: Positive for dysphoric mood (controlled). The patient is not nervous/anxious.        Objective:   Vitals:   02/05/19 1512  BP: 122/86  Pulse: (!) 59  Temp: 98.5 F (36.9 C)  SpO2: 97%   Filed Weights   02/05/19 1512  Weight: 187 lb 14.4 oz (85.2 kg)   Body mass index is 31.27 kg/m.  BP Readings from Last 3 Encounters:  02/05/19 122/86  07/17/18 120/72  07/02/18 128/72    Wt Readings from Last 3 Encounters:  02/05/19 187 lb 14.4 oz (85.2 kg)  07/17/18 176 lb 6.4 oz (80 kg)  07/02/18 174 lb (78.9 kg)     Physical Exam Constitutional: She appears well-developed and well-nourished. No distress.  HENT:  Head: Normocephalic and atraumatic.  Right Ear: External ear normal. Normal ear canal and TM Left Ear: External ear normal.  Normal ear canal and TM Mouth/Throat: Oropharynx is clear and moist.  Eyes: Conjunctivae and EOM are normal.  Neck: Neck supple. No tracheal deviation present. No thyromegaly present.  No carotid bruit  Cardiovascular: Normal rate, regular rhythm and normal heart sounds.   No murmur heard.  No edema. Pulmonary/Chest: Effort normal and breath sounds normal. No respiratory distress. She has no wheezes. She has no rales.  Breast: deferred   Abdominal: Soft. She exhibits no distension. There is no tenderness.  Lymphadenopathy: She has no cervical adenopathy.   Skin: Skin is warm and dry. She is not diaphoretic.  Psychiatric: She has a normal mood and affect. Her behavior is normal.        Assessment & Plan:   Physical exam: Screening blood work ordered Immunizations   Discussed shingrix, others up to date Colonoscopy   Up to date  Mammogram   Up to date  Gyn  Up to date - scheduled for 6/12 Dexa     Up to date  Eye exams  Not up to date Exercise  Walking 10,000 - 12,000 steps daily Weight  Discussed weight loss Skin no concerns Substance abuse   none  See Problem List for Assessment and Plan of chronic medical problems.   FU in 6 months

## 2019-02-04 NOTE — Patient Instructions (Addendum)
Tests ordered today. Your results will be released to [place] (or called to you) after review, usually within 72hours after test completion. If any changes need to be made, you will be notified at that same time.  All other Health Maintenance issues reviewed.   All recommended immunizations and age-appropriate screenings are up-to-date or discussed.  No immunizations administered today.   Medications reviewed and updated.  Changes include :   none  Your prescription(s) have been submitted to your pharmacy. Please take as directed and contact our office if you believe you are having problem(s) with the medication(s).  A referral was ordered for Rheumatology  Please followup in 6 months   Health Maintenance, Female Adopting a healthy lifestyle and getting preventive care can go a long way to promote health and wellness. Talk with your health care provider about what schedule of regular examinations is right for you. This is a good chance for you to check in with your provider about disease prevention and staying healthy. In between checkups, there are plenty of things you can do on your own. Experts have done a lot of research about which lifestyle changes and preventive measures are most likely to keep you healthy. Ask your health care provider for more information. Weight and diet Eat a healthy diet  Be sure to include plenty of vegetables, fruits, low-fat dairy products, and lean protein.  Do not eat a lot of foods high in solid fats, added sugars, or salt.  Get regular exercise. This is one of the most important things you can do for your health. ? Most adults should exercise for at least 150 minutes each week. The exercise should increase your heart rate and make you sweat (moderate-intensity exercise). ? Most adults should also do strengthening exercises at least twice a week. This is in addition to the moderate-intensity exercise. Maintain a healthy weight  Body mass index (BMI)  is a measurement that can be used to identify possible weight problems. It estimates body fat based on height and weight. Your health care provider can help determine your BMI and help you achieve or maintain a healthy weight.  For females 87 years of age and older: ? A BMI below 18.5 is considered underweight. ? A BMI of 18.5 to 24.9 is normal. ? A BMI of 25 to 29.9 is considered overweight. ? A BMI of 30 and above is considered obese. Watch levels of cholesterol and blood lipids  You should start having your blood tested for lipids and cholesterol at 57 years of age, then have this test every 5 years.  You may need to have your cholesterol levels checked more often if: ? Your lipid or cholesterol levels are high. ? You are older than 57 years of age. ? You are at high risk for heart disease. Cancer screening Lung Cancer  Lung cancer screening is recommended for adults 28-75 years old who are at high risk for lung cancer because of a history of smoking.  A yearly low-dose CT scan of the lungs is recommended for people who: ? Currently smoke. ? Have quit within the past 15 years. ? Have at least a 30-pack-year history of smoking. A pack year is smoking an average of one pack of cigarettes a day for 1 year.  Yearly screening should continue until it has been 15 years since you quit.  Yearly screening should stop if you develop a health problem that would prevent you from having lung cancer treatment. Breast Cancer  Practice  breast self-awareness. This means understanding how your breasts normally appear and feel.  It also means doing regular breast self-exams. Let your health care provider know about any changes, no matter how small.  If you are in your 20s or 30s, you should have a clinical breast exam (CBE) by a health care provider every 1-3 years as part of a regular health exam.  If you are 34 or older, have a CBE every year. Also consider having a breast X-ray (mammogram)  every year.  If you have a family history of breast cancer, talk to your health care provider about genetic screening.  If you are at high risk for breast cancer, talk to your health care provider about having an MRI and a mammogram every year.  Breast cancer gene (BRCA) assessment is recommended for women who have family members with BRCA-related cancers. BRCA-related cancers include: ? Breast. ? Ovarian. ? Tubal. ? Peritoneal cancers.  Results of the assessment will determine the need for genetic counseling and BRCA1 and BRCA2 testing. Cervical Cancer Your health care provider may recommend that you be screened regularly for cancer of the pelvic organs (ovaries, uterus, and vagina). This screening involves a pelvic examination, including checking for microscopic changes to the surface of your cervix (Pap test). You may be encouraged to have this screening done every 3 years, beginning at age 55.  For women ages 90-65, health care providers may recommend pelvic exams and Pap testing every 3 years, or they may recommend the Pap and pelvic exam, combined with testing for human papilloma virus (HPV), every 5 years. Some types of HPV increase your risk of cervical cancer. Testing for HPV may also be done on women of any age with unclear Pap test results.  Other health care providers may not recommend any screening for nonpregnant women who are considered low risk for pelvic cancer and who do not have symptoms. Ask your health care provider if a screening pelvic exam is right for you.  If you have had past treatment for cervical cancer or a condition that could lead to cancer, you need Pap tests and screening for cancer for at least 20 years after your treatment. If Pap tests have been discontinued, your risk factors (such as having a new sexual partner) need to be reassessed to determine if screening should resume. Some women have medical problems that increase the chance of getting cervical cancer.  In these cases, your health care provider may recommend more frequent screening and Pap tests. Colorectal Cancer  This type of cancer can be detected and often prevented.  Routine colorectal cancer screening usually begins at 57 years of age and continues through 57 years of age.  Your health care provider may recommend screening at an earlier age if you have risk factors for colon cancer.  Your health care provider may also recommend using home test kits to check for hidden blood in the stool.  A small camera at the end of a tube can be used to examine your colon directly (sigmoidoscopy or colonoscopy). This is done to check for the earliest forms of colorectal cancer.  Routine screening usually begins at age 36.  Direct examination of the colon should be repeated every 5-10 years through 57 years of age. However, you may need to be screened more often if early forms of precancerous polyps or small growths are found. Skin Cancer  Check your skin from head to toe regularly.  Tell your health care provider about any new  moles or changes in moles, especially if there is a change in a mole's shape or color.  Also tell your health care provider if you have a mole that is larger than the size of a pencil eraser.  Always use sunscreen. Apply sunscreen liberally and repeatedly throughout the day.  Protect yourself by wearing long sleeves, pants, a wide-brimmed hat, and sunglasses whenever you are outside. Heart disease, diabetes, and high blood pressure  High blood pressure causes heart disease and increases the risk of stroke. High blood pressure is more likely to develop in: ? People who have blood pressure in the high end of the normal range (130-139/85-89 mm Hg). ? People who are overweight or obese. ? People who are African American.  If you are 25-16 years of age, have your blood pressure checked every 3-5 years. If you are 66 years of age or older, have your blood pressure checked  every year. You should have your blood pressure measured twice-once when you are at a hospital or clinic, and once when you are not at a hospital or clinic. Record the average of the two measurements. To check your blood pressure when you are not at a hospital or clinic, you can use: ? An automated blood pressure machine at a pharmacy. ? A home blood pressure monitor.  If you are between 63 years and 10 years old, ask your health care provider if you should take aspirin to prevent strokes.  Have regular diabetes screenings. This involves taking a blood sample to check your fasting blood sugar level. ? If you are at a normal weight and have a low risk for diabetes, have this test once every three years after 57 years of age. ? If you are overweight and have a high risk for diabetes, consider being tested at a younger age or more often. Preventing infection Hepatitis B  If you have a higher risk for hepatitis B, you should be screened for this virus. You are considered at high risk for hepatitis B if: ? You were born in a country where hepatitis B is common. Ask your health care provider which countries are considered high risk. ? Your parents were born in a high-risk country, and you have not been immunized against hepatitis B (hepatitis B vaccine). ? You have HIV or AIDS. ? You use needles to inject street drugs. ? You live with someone who has hepatitis B. ? You have had sex with someone who has hepatitis B. ? You get hemodialysis treatment. ? You take certain medicines for conditions, including cancer, organ transplantation, and autoimmune conditions. Hepatitis C  Blood testing is recommended for: ? Everyone born from [birth year] through 1964. ? Anyone with known risk factors for hepatitis C. Sexually transmitted infections (STIs)  You should be screened for sexually transmitted infections (STIs) including gonorrhea and chlamydia if: ? You are sexually active and are younger than 57 years of  age. ? You are older than 57 years of age and your health care provider tells you that you are at risk for this type of infection. ? Your sexual activity has changed since you were last screened and you are at an increased risk for chlamydia or gonorrhea. Ask your health care provider if you are at risk.  If you do not have HIV, but are at risk, it may be recommended that you take a prescription medicine daily to prevent HIV infection. This is called pre-exposure prophylaxis (PrEP). You are considered at risk if: ? You  are sexually active and do not regularly use condoms or know the HIV status of your partner(s). ? You take drugs by injection. ? You are sexually active with a partner who has HIV. Talk with your health care provider about whether you are at high risk of being infected with HIV. If you choose to begin PrEP, you should first be tested for HIV. You should then be tested every 3 months for as long as you are taking PrEP. Pregnancy  If you are premenopausal and you may become pregnant, ask your health care provider about preconception counseling.  If you may become pregnant, take 400 to 800 micrograms (mcg) of folic acid every day.  If you want to prevent pregnancy, talk to your health care provider about birth control (contraception). Osteoporosis and menopause  Osteoporosis is a disease in which the bones lose minerals and strength with aging. This can result in serious bone fractures. Your risk for osteoporosis can be identified using a bone density scan.  If you are 72 years of age or older, or if you are at risk for osteoporosis and fractures, ask your health care provider if you should be screened.  Ask your health care provider whether you should take a calcium or vitamin D supplement to lower your risk for osteoporosis.  Menopause may have certain physical symptoms and risks.  Hormone replacement therapy may reduce some of these symptoms and risks. Talk to your health  care provider about whether hormone replacement therapy is right for you. Follow these instructions at home:  Schedule regular health, dental, and eye exams.  Stay current with your immunizations.  Do not use any tobacco products including cigarettes, chewing tobacco, or electronic cigarettes.  If you are pregnant, do not drink alcohol.  If you are breastfeeding, limit how much and how often you drink alcohol.  Limit alcohol intake to no more than 1 drink per day for nonpregnant women. One drink equals 12 ounces of beer, 5 ounces of wine, or 1 ounces of hard liquor.  Do not use street drugs.  Do not share needles.  Ask your health care provider for help if you need support or information about quitting drugs.  Tell your health care provider if you often feel depressed.  Tell your health care provider if you have ever been abused or do not feel safe at home. This information is not intended to replace advice given to you by your health care provider. Make sure you discuss any questions you have with your health care provider. Document Released: 03/14/2011 Document Revised: 02/04/2016 Document Reviewed: 06/02/2015 Elsevier Interactive Patient Education  2019 [employer].

## 2019-02-05 ENCOUNTER — Other Ambulatory Visit (INDEPENDENT_AMBULATORY_CARE_PROVIDER_SITE_OTHER): Payer: 59

## 2019-02-05 ENCOUNTER — Other Ambulatory Visit: Payer: Self-pay

## 2019-02-05 ENCOUNTER — Encounter: Payer: Self-pay | Admitting: Internal Medicine

## 2019-02-05 ENCOUNTER — Ambulatory Visit (INDEPENDENT_AMBULATORY_CARE_PROVIDER_SITE_OTHER): Payer: 59 | Admitting: Internal Medicine

## 2019-02-05 VITALS — BP 122/86 | HR 59 | Temp 98.5°F | Ht 65.0 in | Wt 187.9 lb

## 2019-02-05 DIAGNOSIS — M8589 Other specified disorders of bone density and structure, multiple sites: Secondary | ICD-10-CM | POA: Diagnosis not present

## 2019-02-05 DIAGNOSIS — E538 Deficiency of other specified B group vitamins: Secondary | ICD-10-CM

## 2019-02-05 DIAGNOSIS — E038 Other specified hypothyroidism: Secondary | ICD-10-CM

## 2019-02-05 DIAGNOSIS — E7849 Other hyperlipidemia: Secondary | ICD-10-CM | POA: Diagnosis not present

## 2019-02-05 DIAGNOSIS — F3289 Other specified depressive episodes: Secondary | ICD-10-CM

## 2019-02-05 DIAGNOSIS — M3500 Sicca syndrome, unspecified: Secondary | ICD-10-CM

## 2019-02-05 DIAGNOSIS — E66811 Obesity, class 1: Secondary | ICD-10-CM

## 2019-02-05 DIAGNOSIS — R739 Hyperglycemia, unspecified: Secondary | ICD-10-CM | POA: Diagnosis not present

## 2019-02-05 DIAGNOSIS — F411 Generalized anxiety disorder: Secondary | ICD-10-CM

## 2019-02-05 DIAGNOSIS — Z0000 Encounter for general adult medical examination without abnormal findings: Secondary | ICD-10-CM

## 2019-02-05 DIAGNOSIS — I10 Essential (primary) hypertension: Secondary | ICD-10-CM | POA: Diagnosis not present

## 2019-02-05 DIAGNOSIS — E669 Obesity, unspecified: Secondary | ICD-10-CM | POA: Insufficient documentation

## 2019-02-05 LAB — CBC WITH DIFFERENTIAL/PLATELET
Basophils Absolute: 0.1 10*3/uL (ref 0.0–0.1)
Basophils Relative: 0.5 % (ref 0.0–3.0)
Eosinophils Absolute: 0.1 10*3/uL (ref 0.0–0.7)
Eosinophils Relative: 0.6 % (ref 0.0–5.0)
HCT: 40.6 % (ref 36.0–46.0)
Hemoglobin: 13.7 g/dL (ref 12.0–15.0)
Lymphocytes Relative: 41.2 % (ref 12.0–46.0)
Lymphs Abs: 5.4 10*3/uL — ABNORMAL HIGH (ref 0.7–4.0)
MCHC: 33.6 g/dL (ref 30.0–36.0)
MCV: 92.7 fl (ref 78.0–100.0)
Monocytes Absolute: 0.9 10*3/uL (ref 0.1–1.0)
Monocytes Relative: 7.3 % (ref 3.0–12.0)
Neutro Abs: 6.6 10*3/uL (ref 1.4–7.7)
Neutrophils Relative %: 50.4 % (ref 43.0–77.0)
Platelets: 346 10*3/uL (ref 150.0–400.0)
RBC: 4.38 Mil/uL (ref 3.87–5.11)
RDW: 13.2 % (ref 11.5–15.5)
WBC: 13 10*3/uL — ABNORMAL HIGH (ref 4.0–10.5)

## 2019-02-05 MED ORDER — PHENTERMINE HCL 37.5 MG PO CAPS: 37.5000 mg | ORAL_CAPSULE | ORAL | 2 refills | Status: DC

## 2019-02-05 MED FILL — PHENTERMINE 37.5 MG TABLET: 37.5 | 30 days supply | Qty: 30 | Fill #0

## 2019-02-05 NOTE — Assessment & Plan Note (Signed)
BP well controlled Current regimen effective and well tolerated Continue current medications at current doses cmp  

## 2019-02-05 NOTE — Assessment & Plan Note (Signed)
Having increased dental disease despite taking care of her teeth Will refer the rheum - Dr Estanislado Pandy She would like to get back on medications

## 2019-02-05 NOTE — Assessment & Plan Note (Signed)
Denies any anxiety Continue lexapro

## 2019-02-05 NOTE — Assessment & Plan Note (Signed)
dexa up to date Does a lot of water Taking calcium and vitamin d Will check vitamin d level

## 2019-02-05 NOTE — Assessment & Plan Note (Signed)
Controlled, stable Continue current dose of medication - lexapro 10 mg daily

## 2019-02-05 NOTE — Assessment & Plan Note (Signed)
Trying to lose weight Gets 10000-12000 steps when working Does not eat a lot, but not eating the correct foods More protein and veges - less cheese, bread Will try phentermine again - BP was good when she was taking it Discussed the healthy weight and management clinic F/u in 6 months

## 2019-02-05 NOTE — Assessment & Plan Note (Signed)
Clinically euthyroid Check tsh  Titrate med dose if needed  

## 2019-02-05 NOTE — Assessment & Plan Note (Signed)
Check lipid panel  Continue daily statin Regular exercise and healthy diet encouraged  

## 2019-02-05 NOTE — Assessment & Plan Note (Signed)
Taking B12 Check level 

## 2019-02-05 NOTE — Assessment & Plan Note (Signed)
a1c

## 2019-02-06 ENCOUNTER — Encounter: Payer: Self-pay | Admitting: Internal Medicine

## 2019-02-06 LAB — COMPREHENSIVE METABOLIC PANEL
ALT: 36 U/L — ABNORMAL HIGH (ref 0–35)
AST: 22 U/L (ref 0–37)
Albumin: 3.8 g/dL (ref 3.5–5.2)
Alkaline Phosphatase: 49 U/L (ref 39–117)
BUN: 17 mg/dL (ref 6–23)
CO2: 28 mEq/L (ref 19–32)
Calcium: 9.3 mg/dL (ref 8.4–10.5)
Chloride: 101 mEq/L (ref 96–112)
Creatinine, Ser: 0.81 mg/dL (ref 0.40–1.20)
GFR: 72.86 mL/min (ref >60.00)
Glucose, Bld: 89 mg/dL (ref 70–99)
Potassium: 4.3 mEq/L (ref 3.5–5.1)
Sodium: 138 mEq/L (ref 135–145)
Total Bilirubin: 0.3 mg/dL (ref 0.2–1.2)
Total Protein: 7.1 g/dL (ref 6.0–8.3)

## 2019-02-06 LAB — LIPID PANEL
Cholesterol: 202 mg/dL — ABNORMAL HIGH (ref 0–200)
HDL: 52.8 mg/dL (ref >39.00)
NonHDL: 148.74
Total CHOL/HDL Ratio: 4
Triglycerides: 345 mg/dL — ABNORMAL HIGH (ref 0.0–149.0)
VLDL: 69 mg/dL — ABNORMAL HIGH (ref 0.0–40.0)

## 2019-02-06 LAB — TSH: TSH: 4.23 u[IU]/mL (ref 0.35–4.50)

## 2019-02-06 LAB — LDL CHOLESTEROL, DIRECT: Direct LDL: 103 mg/dL

## 2019-02-06 LAB — VITAMIN B12: Vitamin B-12: 365 pg/mL (ref 211–911)

## 2019-02-06 LAB — HEMOGLOBIN A1C: Hgb A1c MFr Bld: 6.3 % (ref 4.6–6.5)

## 2019-02-06 LAB — VITAMIN D 25 HYDROXY (VIT D DEFICIENCY, FRACTURES): VITD: 12.39 ng/mL — ABNORMAL LOW (ref 30.00–100.00)

## 2019-02-12 DIAGNOSIS — E669 Obesity, unspecified: Secondary | ICD-10-CM | POA: Diagnosis not present

## 2019-02-12 DIAGNOSIS — M3501 Sicca syndrome with keratoconjunctivitis: Secondary | ICD-10-CM | POA: Diagnosis not present

## 2019-02-12 DIAGNOSIS — M797 Fibromyalgia: Secondary | ICD-10-CM | POA: Diagnosis not present

## 2019-02-12 DIAGNOSIS — D72820 Lymphocytosis (symptomatic): Secondary | ICD-10-CM | POA: Diagnosis not present

## 2019-02-12 DIAGNOSIS — K029 Dental caries, unspecified: Secondary | ICD-10-CM | POA: Diagnosis not present

## 2019-02-12 DIAGNOSIS — Z6831 Body mass index (BMI) 31.0-31.9, adult: Secondary | ICD-10-CM | POA: Diagnosis not present

## 2019-02-12 DIAGNOSIS — I7300 Raynaud's syndrome without gangrene: Secondary | ICD-10-CM | POA: Diagnosis not present

## 2019-02-12 MED FILL — GABAPENTIN 300 MG CAPSULE: 300 | 30 days supply | Qty: 30 | Fill #0

## 2019-02-13 MED FILL — CEVIMELINE HCL 30 MG CAPS: 30 | 30 days supply | Qty: 90 | Fill #0

## 2019-02-14 MED FILL — SF 5000 PLUS CREAM: 1.1 | 30 days supply | Qty: 51 | Fill #0

## 2019-02-15 MED FILL — HYDROXYCHLOROQUINE SULFATE: 200 | 30 days supply | Qty: 60 | Fill #0

## 2019-02-21 ENCOUNTER — Other Ambulatory Visit: Payer: Self-pay | Admitting: Internal Medicine

## 2019-02-21 MED FILL — LOSARTAN POTASSIUM 50 MG TA: 50 | 30 days supply | Qty: 30 | Fill #0

## 2019-02-22 ENCOUNTER — Encounter: Payer: 59 | Admitting: Obstetrics & Gynecology

## 2019-03-06 ENCOUNTER — Other Ambulatory Visit: Payer: Self-pay | Admitting: Internal Medicine

## 2019-03-06 MED FILL — ESCITALOPRAM 20 MG TABLET: 20 | 90 days supply | Qty: 90 | Fill #0

## 2019-03-07 ENCOUNTER — Encounter: Payer: Self-pay | Admitting: Internal Medicine

## 2019-03-08 MED ORDER — NYSTATIN 100000 UNIT/GM EX CREA: 1.0000 "application " | TOPICAL_CREAM | Freq: Two times a day (BID) | CUTANEOUS | 0 refills | Status: DC

## 2019-03-08 MED FILL — NYSTATIN 100,000 UNIT/GM CR: 100000 | 25 days supply | Qty: 30 | Fill #0

## 2019-03-13 MED FILL — PHENTERMINE 37.5 MG TABLET: 37.5 | 30 days supply | Qty: 30 | Fill #1

## 2019-03-18 ENCOUNTER — Other Ambulatory Visit: Payer: Self-pay | Admitting: Internal Medicine

## 2019-03-18 MED FILL — HYDROXYCHLOROQUINE SULFATE: 200 | 30 days supply | Qty: 60 | Fill #1

## 2019-03-18 MED FILL — CEVIMELINE HCL 30 MG CAPS: 30 | 30 days supply | Qty: 90 | Fill #0

## 2019-03-18 MED FILL — GABAPENTIN 300 MG CAPSULE: 300 | 30 days supply | Qty: 30 | Fill #1

## 2019-03-19 MED FILL — ROSUVASTATIN CALCIUM 20 MG: 20 | 30 days supply | Qty: 30 | Fill #0

## 2019-03-22 MED FILL — LEVOTHYROXINE 88 MCG TABLET: 88 | 90 days supply | Qty: 90 | Fill #2

## 2019-03-29 ENCOUNTER — Other Ambulatory Visit: Payer: Self-pay

## 2019-04-02 ENCOUNTER — Ambulatory Visit: Payer: 59 | Admitting: Endocrinology

## 2019-04-02 ENCOUNTER — Other Ambulatory Visit: Payer: Self-pay

## 2019-04-02 ENCOUNTER — Encounter: Payer: Self-pay | Admitting: Endocrinology

## 2019-04-02 VITALS — BP 122/62 | HR 78 | Ht 65.0 in | Wt 181.6 lb

## 2019-04-02 DIAGNOSIS — E042 Nontoxic multinodular goiter: Secondary | ICD-10-CM

## 2019-04-02 NOTE — Patient Instructions (Signed)
Please continue the same medication. Let's recheck the ultrasound.  you will receive a phone call, about a day and time for an appointment. If there is little or no change, please come back for a follow-up appointment in 2 years.

## 2019-04-02 NOTE — Progress Notes (Signed)
Subjective:    Patient ID: Margaret Green, female    DOB: 1961-01-01, 58 y.o.   MRN: [id number]  HPI Pt returns for MNG and chronic primary hypothyroidism (hypothyroidism was dx'ed 1982; she has been on prescribed thyroid hormone therapy since then; over the past few years, the synthroid dosage has varied between 75 and 100 mcg/day; [place] in 2015 showed small multinodular goiter; f/u US in 2018 and 2019 showed no signif change).  She says it would be hard for her to tell about throat sxs, due to Sjogren's synd.  pt states she feels well in general.  She does notice any change in the size of the goiter.   Past Medical History:  Diagnosis Date  . 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 Allergy   . Anxiety   . Arthritis   . Endometriosis 08/19/2009   Hysterectomy.     . Family history of diabetes mellitus   . GERD (gastroesophageal reflux disease)   . Headache(784.0)   . Heart murmur    past hx of heart murmur  . Hyperlipidemia   . Hypothyroidism   . Neutropenia   . Raynaud's syndrome   . Sjogrens syndrome ([place])   . Stricture and stenosis of esophagus     Past Surgical History:  Procedure Laterality Date  . ABDOMINAL HYSTERECTOMY    . BREAST BIOPSY     benign  . BREAST REDUCTION SURGERY    . COLONOSCOPY    . HERNIA REPAIR    . POLYPECTOMY    . REDUCTION MAMMAPLASTY    . UPPER GASTROINTESTINAL ENDOSCOPY      Social History   Socioeconomic History  . Marital status: Single    Spouse name: Not on file  . Number of children: Not on file  . Years of education: Not on file  . Highest education level: Not on file  Occupational History  . Not on file  Social Needs  . Financial resource strain: Not on file  . Food insecurity    Worry: Not on file    Inability: Not on file  . Transportation needs    Medical: Not on file    Non-medical: Not on file  Tobacco Use  . Smoking status: Never Smoker  . Smokeless tobacco: Never  Used  Substance and Sexual Activity  . Alcohol use: Yes    Alcohol/week: 0.0 standard drinks    Comment: occasionally  . Drug use: No  . Sexual activity: Yes  Lifestyle  . Physical activity    Days per week: Not on file    Minutes per session: Not on file  . Stress: Not on file  Relationships  . Social Herbalist on phone: Not on file    Gets together: Not on file    Attends religious service: Not on file    Active member of club or organization: Not on file    Attends meetings of clubs or organizations: Not on file    Relationship status: Not on file  . Intimate partner violence    Fear of current or ex partner: Not on file    Emotionally abused: Not on file    Physically abused: Not on file    Forced sexual activity: Not on file  Other Topics Concern  . Not on file  Social History Narrative   Partner for 2 years in 2013. Prior 20 year relationship-still friends. No children. 1 dog-peekapoo.  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       Hobbies: golf, cooking, gardening, poker    Current Outpatient Medications on File Prior to Visit  Medication Sig Dispense Refill  . Calcium Carbonate-Vitamin D (CALCIUM 600+D) 600-400 MG-UNIT tablet Take 1 tablet by mouth daily.    . diazepam (VALIUM) 2 MG tablet Take 1-2 tablets (2-4 mg total) by mouth every 8 (eight) hours as needed for anxiety. 15 tablet 0  . escitalopram (LEXAPRO) 20 MG tablet TAKE 1 TABLET BY MOUTH ONCE DAILY 90 tablet 0  . estradiol (VIVELLE-DOT) 0.05 MG/24HR patch Place 1 patch (0.05 mg total) onto the skin 2 (two) times a week. Pt places on Wednesday and Sunday. 24 patch 0  . ipratropium (ATROVENT) 0.06 % nasal spray Place 2 sprays into both nostrils 4 (four) times daily. 15 mL 1  . levothyroxine (SYNTHROID, LEVOTHROID) 88 MCG tablet Take 1 tablet (88 mcg total) by mouth daily before breakfast. 90 tablet 3  . losartan (COZAAR) 50 MG  tablet TAKE 1 TABLET (50 MG TOTAL) BY MOUTH DAILY. 30 tablet 5  . meclizine (ANTIVERT) 12.5 MG tablet Take 1-2 tablets (12.5-25 mg total) by mouth 3 (three) times daily as needed for dizziness. 30 tablet 0  . nystatin cream (MYCOSTATIN) Apply 1 application topically 2 (two) times daily. 30 g 0  . phentermine 37.5 MG capsule Take 1 capsule (37.5 mg total) by mouth every morning. 30 capsule 2  . rosuvastatin (CRESTOR) 20 MG tablet TAKE 1 TABLET (20 MG TOTAL) BY MOUTH DAILY. 30 tablet 5  . tiZANidine (ZANAFLEX) 4 MG tablet TAKE 1 TABLET BY MOUTH TWICE DAILY AS NEEDED MUSCLE SPASM 60 tablet 0   No current facility-administered medications on file prior to visit.     Allergies  Allergen Reactions  . Abilify [Aripiprazole]     Nosebleeds  . Codeine Nausea And Vomiting  . Omeprazole Hives  . Meloxicam Itching and Rash  . Oxycodone-Aspirin Itching and Rash  . Sulfamethoxazole Itching and Rash    Family History  Problem Relation Age of Onset  . Coronary artery disease Father        age 79 - 43 stents, all 3 siblings with early cardiac disease  . Colon polyps Father   . Heart attack Father 45  . Heart disease Father   . Cirrhosis Mother        alcohol related  . Alcohol abuse Mother   . Ovarian cancer Maternal Aunt   . Stomach cancer Paternal Aunt   . Esophageal cancer Paternal Aunt   . Diabetes Paternal Grandmother        and aunt  . Colon polyps Sister   . Heart attack Paternal Uncle   . Colon cancer Neg Hx   . Rectal cancer Neg Hx     BP 122/62 (BP Location: Left Arm, Patient Position: Sitting, Cuff Size: Normal)   Pulse 78   Ht 5\' 5"  (1.651 m)   Wt 181 lb 9.6 oz (82.4 kg)   SpO2 97%   BMI 30.22 kg/m    Review of Systems Denies neck pain.      Objective:   Physical Exam VITAL SIGNS:  See vs page GENERAL: no distress NECK: small multinodular goiter is again noted.  No palpable lymphadenopathy at the anterior neck.    Lab Results  Component Value Date   TSH 4.23  02/05/2019       Assessment & Plan:  Hypothyroidism: well-replaced Goiter: due for recheck  Patient Instructions  Please continue the same medication. Let's recheck the ultrasound.  you will receive a phone call, about a day and time for an appointment. If there is little or no change, please come back for a follow-up appointment in 2 years.

## 2019-04-18 ENCOUNTER — Ambulatory Visit
Admission: RE | Admit: 2019-04-18 | Discharge: 2019-04-18 | Disposition: A | Discharge status: Home / Self Care | Payer: 59 | Source: Ambulatory Visit | Attending: Endocrinology | Admitting: Endocrinology

## 2019-04-18 DIAGNOSIS — E042 Nontoxic multinodular goiter: Secondary | ICD-10-CM | POA: Diagnosis not present

## 2019-04-22 ENCOUNTER — Encounter: Payer: 59 | Admitting: Obstetrics & Gynecology

## 2019-04-24 ENCOUNTER — Other Ambulatory Visit: Payer: Self-pay | Admitting: Internal Medicine

## 2019-04-24 MED FILL — LOSARTAN POTASSIUM 50 MG TA: 50 | 30 days supply | Qty: 30 | Fill #1

## 2019-04-24 MED FILL — PHENTERMINE 37.5 MG TABLET: 37.5 | 30 days supply | Qty: 30 | Fill #2

## 2019-04-25 MED FILL — GABAPENTIN 300 MG CAPSULE: 300 | 30 days supply | Qty: 30 | Fill #0

## 2019-04-29 MED FILL — CEVIMELINE HCL 30 MG CAPS: 30 | 30 days supply | Qty: 90 | Fill #0

## 2019-04-29 MED FILL — HYDROXYCHLOROQUINE SULFATE: 200 | 30 days supply | Qty: 60 | Fill #2

## 2019-05-01 ENCOUNTER — Other Ambulatory Visit: Payer: Self-pay

## 2019-05-01 ENCOUNTER — Ambulatory Visit (INDEPENDENT_AMBULATORY_CARE_PROVIDER_SITE_OTHER): Payer: 59 | Admitting: Obstetrics & Gynecology

## 2019-05-01 ENCOUNTER — Encounter: Payer: 59 | Admitting: Obstetrics & Gynecology

## 2019-05-01 ENCOUNTER — Encounter: Payer: Self-pay | Admitting: Obstetrics & Gynecology

## 2019-05-01 VITALS — BP 120/70 | Ht 63.75 in | Wt 182.0 lb

## 2019-05-01 DIAGNOSIS — Z90722 Acquired absence of ovaries, bilateral: Secondary | ICD-10-CM

## 2019-05-01 DIAGNOSIS — Z9079 Acquired absence of other genital organ(s): Secondary | ICD-10-CM

## 2019-05-01 DIAGNOSIS — Z01419 Encounter for gynecological examination (general) (routine) without abnormal findings: Secondary | ICD-10-CM

## 2019-05-01 DIAGNOSIS — Z780 Asymptomatic menopausal state: Secondary | ICD-10-CM | POA: Diagnosis not present

## 2019-05-01 DIAGNOSIS — Z90710 Acquired absence of both cervix and uterus: Secondary | ICD-10-CM

## 2019-05-01 DIAGNOSIS — E6609 Other obesity due to excess calories: Secondary | ICD-10-CM | POA: Diagnosis not present

## 2019-05-01 DIAGNOSIS — Z6831 Body mass index (BMI) 31.0-31.9, adult: Secondary | ICD-10-CM

## 2019-05-01 DIAGNOSIS — M8589 Other specified disorders of bone density and structure, multiple sites: Secondary | ICD-10-CM

## 2019-05-01 DIAGNOSIS — Z1272 Encounter for screening for malignant neoplasm of vagina: Secondary | ICD-10-CM

## 2019-05-01 NOTE — Progress Notes (Signed)
Margaret Green Feb 02, 1961 [id number]   History:    58 y.o. G0 Single  RP:  New patient (>3 yrs) patient presenting for annual gyn exam   HPI: TAH/BSO for Endometriosis.  No pelvic pain.  Currently abstinent.  Urine and bowel movements normal.  Breast normal.  Body mass index 31.49.  Needs to increase physical activities.  Health labs with family physician.  Colonoscopy in 2017.  Past medical history,surgical history, family history and social history were all reviewed and documented in the EPIC chart.  Gynecologic History No LMP recorded. Patient has had a hysterectomy. Contraception: status post hysterectomy Last Pap: 07/2015. Results were: Negative Last mammogram: 09/2018. Results were: Benign Bone Density: 07/2017 Osteopenia -2.1  Colonoscopy: 08/2016  Obstetric History OB History  Gravida Para Term Preterm AB Living  0 0 0 0 0 0  SAB TAB Ectopic Multiple Live Births  0 0 0 0 0     ROS: A ROS was performed and pertinent positives and negatives are included in the history.  GENERAL: No fevers or chills. HEENT: No change in vision, no earache, sore throat or sinus congestion. NECK: No pain or stiffness. CARDIOVASCULAR: No chest pain or pressure. No palpitations. PULMONARY: No shortness of breath, cough or wheeze. GASTROINTESTINAL: No abdominal pain, nausea, vomiting or diarrhea, melena or bright red blood per rectum. GENITOURINARY: No urinary frequency, urgency, hesitancy or dysuria. MUSCULOSKELETAL: No joint or muscle pain, no back pain, no recent trauma. DERMATOLOGIC: No rash, no itching, no lesions. ENDOCRINE: No polyuria, polydipsia, no heat or cold intolerance. No recent change in weight. HEMATOLOGICAL: No anemia or easy bruising or bleeding. NEUROLOGIC: No headache, seizures, numbness, tingling or weakness. PSYCHIATRIC: No depression, no loss of interest in normal activity or change in sleep pattern.     Exam:   BP 120/70    Ht 5' 3.75" (1.619 m)    Wt 182 lb (82.6  kg)    BMI 31.49 kg/m   Body mass index is 31.49 kg/m.  General appearance : Well developed well nourished female. No acute distress HEENT: Eyes: no retinal hemorrhage or exudates,  Neck supple, trachea midline, no carotid bruits, no thyroidmegaly Lungs: Clear to auscultation, no rhonchi or wheezes, or rib retractions  Heart: Regular rate and rhythm, no murmurs or gallops Breast:Examined in sitting and supine position were symmetrical in appearance, no palpable masses or tenderness,  no skin retraction, no nipple inversion, no nipple discharge, no skin discoloration, no axillary or supraclavicular lymphadenopathy Abdomen: no palpable masses or tenderness, no rebound or guarding Extremities: no edema or skin discoloration or tenderness  Pelvic: Vulva: Normal             Vagina: No gross lesions or discharge.  Pap reflex done.  Cervix/Uterus absent  Adnexa  Without masses or tenderness  Anus: Normal   Assessment/Plan:  58 y.o. female for annual exam   1. Encounter for Papanicolaou smear of vagina as part of routine gynecological examination Gynecologic exam status post TAH/BSO.  Pap reflex done on the vaginal vault.  Breast exam normal.  Screening mammogram January 2020 was benign.  Colonoscopy in 2017.  Health labs with family physician.  2. S/P TAH-BSO  3. Postmenopause Well on no hormone replacement therapy.  4. Osteopenia of multiple sites Osteopenia with lowest T score at -2.1 on bone density in November 2018.  We will repeat a bone density in November 2020.  Recommend vitamin D supplements, calcium intake of 1200 mg daily and regular weightbearing physical  activities.  5. Class 1 obesity due to excess calories without serious comorbidity with body mass index (BMI) of 31.0 to 31.9 in adult Recommend a lower calorie/carb diet such as [employer].  Aerobic physical activities 5 times a week and weightlifting every 2 days.  Princess Bruins MD, 4:08 PM 05/01/2019

## 2019-05-02 LAB — PAP IG W/ RFLX HPV ASCU

## 2019-05-06 ENCOUNTER — Encounter: Payer: Self-pay | Admitting: Obstetrics & Gynecology

## 2019-05-06 MED FILL — ROSUVASTATIN CALCIUM 20 MG: 20 | 30 days supply | Qty: 30 | Fill #1

## 2019-05-06 NOTE — Patient Instructions (Signed)
1. Encounter for Papanicolaou smear of vagina as part of routine gynecological examination Gynecologic exam status post TAH/BSO.  Pap reflex done on the vaginal vault.  Breast exam normal.  Screening mammogram January 2020 was benign.  Colonoscopy in 2017.  Health labs with family physician.  2. S/P TAH-BSO  3. Postmenopause Well on no hormone replacement therapy.  4. Osteopenia of multiple sites Osteopenia with lowest T score at -2.1 on bone density in November 2018.  We will repeat a bone density in November 2020.  Recommend vitamin D supplements, calcium intake of 1200 mg daily and regular weightbearing physical activities.  5. Class 1 obesity due to excess calories without serious comorbidity with body mass index (BMI) of 31.0 to 31.9 in adult Recommend a lower calorie/carb diet such as [employer].  Aerobic physical activities 5 times a week and weightlifting every 2 days.  Daniyla, it was a pleasure seeing you today!  I will inform you of your results as soon as they are available.

## 2019-05-08 MED FILL — SF 5000 PLUS CREAM: 1.1 | 30 days supply | Qty: 51 | Fill #1

## 2019-05-13 DIAGNOSIS — M3501 Sicca syndrome with keratoconjunctivitis: Secondary | ICD-10-CM | POA: Diagnosis not present

## 2019-05-13 DIAGNOSIS — I7300 Raynaud's syndrome without gangrene: Secondary | ICD-10-CM | POA: Diagnosis not present

## 2019-05-13 DIAGNOSIS — M797 Fibromyalgia: Secondary | ICD-10-CM | POA: Diagnosis not present

## 2019-05-13 DIAGNOSIS — E663 Overweight: Secondary | ICD-10-CM | POA: Diagnosis not present

## 2019-05-13 DIAGNOSIS — Z6831 Body mass index (BMI) 31.0-31.9, adult: Secondary | ICD-10-CM | POA: Diagnosis not present

## 2019-05-13 DIAGNOSIS — K029 Dental caries, unspecified: Secondary | ICD-10-CM | POA: Diagnosis not present

## 2019-05-13 DIAGNOSIS — D72820 Lymphocytosis (symptomatic): Secondary | ICD-10-CM | POA: Diagnosis not present

## 2019-05-28 ENCOUNTER — Other Ambulatory Visit: Payer: Self-pay | Admitting: Endocrinology

## 2019-05-28 MED FILL — LOSARTAN POTASSIUM 50 MG TA: 50 | 30 days supply | Qty: 30 | Fill #2

## 2019-05-28 MED FILL — GABAPENTIN 300 MG CAPSULE: 300 | 30 days supply | Qty: 30 | Fill #1

## 2019-06-04 MED FILL — AMOXICILLIN 500 MG CAPSULE: 500 | 7 days supply | Qty: 21 | Fill #0

## 2019-06-18 ENCOUNTER — Other Ambulatory Visit: Payer: Self-pay | Admitting: Internal Medicine

## 2019-06-18 MED FILL — ESCITALOPRAM 20 MG TABLET: 20 | 90 days supply | Qty: 90 | Fill #0

## 2019-06-26 ENCOUNTER — Other Ambulatory Visit: Payer: Self-pay | Admitting: Internal Medicine

## 2019-06-26 MED FILL — HYDROXYCHLOROQUINE SULFATE: 200 | 30 days supply | Qty: 60 | Fill #0

## 2019-06-26 MED FILL — ROSUVASTATIN CALCIUM 20 MG: 20 | 30 days supply | Qty: 30 | Fill #2

## 2019-06-26 MED FILL — CEVIMELINE HCL 30 MG CAPS: 30 | 30 days supply | Qty: 90 | Fill #0

## 2019-06-26 MED FILL — GABAPENTIN 300 MG CAPSULE: 300 | 30 days supply | Qty: 30 | Fill #0

## 2019-06-26 MED FILL — tiZANidine HCL 4 MG TABS: 4 | 30 days supply | Qty: 60 | Fill #0

## 2019-06-27 MED FILL — PHENTERMINE 37.5 MG TABLET: 37.5 | 30 days supply | Qty: 30 | Fill #0

## 2019-06-28 MED FILL — LEVOTHYROXINE 88 MCG TABLET: 88 | 90 days supply | Qty: 90 | Fill #0

## 2019-07-08 MED FILL — LOSARTAN POTASSIUM 50 MG TA: 50 | 30 days supply | Qty: 30 | Fill #3

## 2019-07-09 DIAGNOSIS — H5213 Myopia, bilateral: Secondary | ICD-10-CM | POA: Diagnosis not present

## 2019-07-16 ENCOUNTER — Encounter: Payer: Self-pay | Admitting: Gastroenterology

## 2019-08-02 MED FILL — CEVIMELINE HCL 30 MG CAPS: 30 | 30 days supply | Qty: 90 | Fill #1

## 2019-08-02 MED FILL — HYDROXYCHLOROQUINE SULFATE: 200 | 30 days supply | Qty: 60 | Fill #1

## 2019-08-06 MED FILL — GABAPENTIN 300 MG CAPSULE: 300 | 30 days supply | Qty: 30 | Fill #1

## 2019-08-06 MED FILL — PHENTERMINE 37.5 MG TABLET: 37.5 | 30 days supply | Qty: 30 | Fill #1

## 2019-08-06 MED FILL — ROSUVASTATIN CALCIUM 20 MG: 20 | 30 days supply | Qty: 30 | Fill #3

## 2019-08-10 DIAGNOSIS — M940 Chondrocostal junction syndrome [Tietze]: Secondary | ICD-10-CM | POA: Diagnosis not present

## 2019-08-10 DIAGNOSIS — R9389 Abnormal findings on diagnostic imaging of other specified body structures: Secondary | ICD-10-CM | POA: Diagnosis not present

## 2019-08-10 DIAGNOSIS — R0781 Pleurodynia: Secondary | ICD-10-CM | POA: Diagnosis not present

## 2019-08-11 NOTE — Patient Instructions (Addendum)
  Tests ordered today. Your results will be released to [facility] (or called to you) after review.  If any changes need to be made, you will be notified at that same time.  A CT scan of your lungs was ordered.    Medications reviewed and updated.  Changes include :   none  Your prescription(s) have been submitted to your pharmacy. Please take as directed and contact our office if you believe you are having problem(s) with the medication(s).    Please followup in 6 months

## 2019-08-11 NOTE — Progress Notes (Signed)
Subjective:    Patient ID: Margaret Green, female    DOB: 1961-03-03, 58 y.o.   MRN: [id number]  HPI The patient is here for follow up.  She is exercising regularly - riding stationary bike.  She has been working on dietary changes.  She is no longer drinking regular soda.  She knows she needs to work on weight loss, but has made some significant changes in her diet.  Hypertension: She is taking her medication daily, but has been out of her medication for a few days. She is compliant with a low sodium diet.  She denies chest pain, palpitations, edema, shortness of breath and regular headaches.    Hyperlipidemia: She is taking her medication daily. She is compliant with a low fat/cholesterol diet. She denies myalgias.   Hypothyroidism:  She is taking her medication daily.  She denies any recent changes in energy or weight that are unexplained.   B12 def:  She is taking B12 daily.    Prediabetes:  She is compliant with a low sugar/carbohydrate diet.  She is exercising.  Depression: She is taking her medication daily as prescribed. She denies any side effects from the medication. She feels her depression is well controlled and she is happy with her current dose of medication.   Anxiety: She is taking her medication daily as prescribed. She denies any side effects from the medication. She feels her anxiety is well controlled and she is happy with her current dose of medication.   She has numbness/tingling in right lateral toes. Today she had pain in her posterior right buttock / thigh region.  She denies lower back pain.  Her symptoms are not related to certain positions.  Medications and allergies reviewed with patient and updated if appropriate.  Patient Active Problem List   Diagnosis Date Noted  . Lumbosacral radiculopathy at S1 08/12/2019  . Vitamin D deficiency 08/12/2019  . Obesity (BMI 30.0-34.9) 02/05/2019  . Vertigo 07/17/2018  . B12 deficiency 07/02/2018  .  Prediabetes 12/23/2017  . Osteopenia 08/10/2017  . Multinodular goiter 03/28/2017  . Chest pain with low risk for cardiac etiology 01/02/2017  . Sjogren's disease ([place]) 08/27/2015  . Depression 07/23/2015  . Adenocarcinoma, colon ([place]) 07/23/2015  . Migraine 08/14/2014  . Allergic rhinitis 08/14/2014  . IBS (irritable bowel syndrome) 08/14/2014  . Essential hypertension, benign 08/14/2014  . GERD (gastroesophageal reflux disease) 08/14/2014  . Right thyroid nodule 09/24/2013  . GAD (generalized anxiety disorder) 09/29/2010  . ANEMIA, PERNICIOUS 02/24/2009  . Hyperlipidemia 04/13/2007  . RAYNAUD'S DISEASE 04/13/2007  . Osteoarthritis 04/13/2007  . Fibromyalgia 04/13/2007  . Hypothyroidism 04/03/2007    Current Outpatient Medications on File Prior to Visit  Medication Sig Dispense Refill  . Calcium Carbonate-Vitamin D (CALCIUM 600+D) 600-400 MG-UNIT tablet Take 1 tablet by mouth daily.    . cevimeline (EVOXAC) 30 MG capsule     . diazepam (VALIUM) 2 MG tablet Take 1-2 tablets (2-4 mg total) by mouth every 8 (eight) hours as needed for anxiety. 15 tablet 0  . escitalopram (LEXAPRO) 20 MG tablet TAKE 1 TABLET BY MOUTH ONCE DAILY 90 tablet 0  . estradiol (VIVELLE-DOT) 0.05 MG/24HR patch Place 1 patch (0.05 mg total) onto the skin 2 (two) times a week. Pt places on Wednesday and Sunday. 24 patch 0  . hydroxychloroquine (PLAQUENIL) 200 MG tablet     . ipratropium (ATROVENT) 0.06 % nasal spray Place 2 sprays into both nostrils 4 (four) times daily. 15 mL 1  .  levothyroxine (SYNTHROID) 88 MCG tablet TAKE 1 TABLET BY MOUTH DAILY BEFORE BREAKFAST. 90 tablet 3  . losartan (COZAAR) 50 MG tablet TAKE 1 TABLET (50 MG TOTAL) BY MOUTH DAILY. 30 tablet 5  . phentermine (ADIPEX-P) 37.5 MG tablet TAKE 1 TABLET BY MOUTH EVERY MORNING. 30 tablet 2  . rosuvastatin (CRESTOR) 20 MG tablet TAKE 1 TABLET (20 MG TOTAL) BY MOUTH DAILY. 30 tablet 5  . tiZANidine (ZANAFLEX) 4 MG tablet TAKE 1 TABLET BY MOUTH  TWICE DAILY AS NEEDED MUSCLE SPASM 60 tablet 0   No current facility-administered medications on file prior to visit.     Past Medical History:  Diagnosis Date  . Adenocarcinoma, colon ([place]) 07/23/2015   Removed completely by polypectomy 06/2015   . Allergy   . Anxiety   . Arthritis   . Endometriosis 08/19/2009   Hysterectomy.     . Family history of diabetes mellitus   . GERD (gastroesophageal reflux disease)   . Headache(784.0)   . Heart murmur    past hx of heart murmur  . Hyperlipidemia   . Hypothyroidism   . Neutropenia   . Raynaud's syndrome   . Sjogrens syndrome ([place])   . Stricture and stenosis of esophagus     Past Surgical History:  Procedure Laterality Date  . ABDOMINAL HYSTERECTOMY    . BREAST BIOPSY     benign  . BREAST REDUCTION SURGERY    . COLONOSCOPY    . HERNIA REPAIR    . POLYPECTOMY    . REDUCTION MAMMAPLASTY    . UPPER GASTROINTESTINAL ENDOSCOPY      Social History   Socioeconomic History  . Marital status: Single    Spouse name: Not on file  . Number of children: Not on file  . Years of education: Not on file  . Highest education level: Not on file  Occupational History  . Not on file  Social Needs  . Financial resource strain: Not on file  . Food insecurity    Worry: Not on file    Inability: Not on file  . Transportation needs    Medical: Not on file    Non-medical: Not on file  Tobacco Use  . Smoking status: Never Smoker  . Smokeless tobacco: Never Used  Substance and Sexual Activity  . Alcohol use: Yes    Alcohol/week: 0.0 standard drinks    Comment: occasionally  . Drug use: No  . Sexual activity: Not Currently    Comment: 1st intercourse- 2, partners- 3,   Lifestyle  . Physical activity    Days per week: Not on file    Minutes per session: Not on file  . Stress: Not on file  Relationships  . Social Herbalist on phone: Not on file    Gets together: Not on file    Attends religious service: Not on file     Active member of club or organization: Not on file    Attends meetings of clubs or organizations: Not on file    Relationship status: Not on file  Other Topics Concern  . Not on file  Social History Narrative   Partner for 2 years in 2013. Prior 20 year relationship-still friends. No children. 1 dog-peekapoo.       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       Hobbies: golf, cooking, gardening, poker    Family History  Problem Relation Age of Onset  .  Coronary artery disease Father        age 60 - 31 stents, all 3 siblings with early cardiac disease  . Colon polyps Father   . Heart attack Father 84  . Heart disease Father   . Cirrhosis Mother        alcohol related  . Alcohol abuse Mother   . Ovarian cancer Maternal Aunt   . Stomach cancer Paternal Aunt   . Esophageal cancer Paternal Aunt   . Diabetes Paternal Grandmother        and aunt  . Colon polyps Sister   . Heart attack Paternal Uncle   . Colon cancer Neg Hx   . Rectal cancer Neg Hx     Review of Systems  Constitutional: Negative for chills and fever.  Respiratory: Negative for cough, shortness of breath and wheezing.   Cardiovascular: Negative for chest pain, palpitations and leg swelling.  Neurological: Negative for light-headedness and headaches.       Objective:   Vitals:   08/12/19 1455  BP: (!) 142/78  Pulse: 64  Resp: 16  Temp: 98.4 F (36.9 C)  SpO2: 99%   BP Readings from Last 3 Encounters:  08/12/19 (!) 142/78  05/01/19 120/70  04/02/19 122/62   Wt Readings from Last 3 Encounters:  08/12/19 183 lb (83 kg)  05/01/19 182 lb (82.6 kg)  04/02/19 181 lb 9.6 oz (82.4 kg)   Body mass index is 31.66 kg/m.   Physical Exam    Constitutional: Appears well-developed and well-nourished. No distress.  HENT:  Head: Normocephalic and atraumatic.  Neck: Neck supple. No tracheal deviation present. No thyromegaly present.  No  cervical lymphadenopathy Cardiovascular: Normal rate, regular rhythm and normal heart sounds.   No murmur heard. No carotid bruit .  No edema Pulmonary/Chest: Effort normal and breath sounds normal. No respiratory distress. No has no wheezes. No rales.  Skin: Skin is warm and dry. Not diaphoretic.  Psychiatric: Normal mood and affect. Behavior is normal.      Assessment & Plan:    See Problem List for Assessment and Plan of chronic medical problems.     This visit occurred during the SARS-CoV-2 public health emergency.  Safety protocols were in place, including screening questions prior to the visit, additional usage of staff PPE, and extensive cleaning of exam room while observing appropriate contact time as indicated for disinfecting solutions.

## 2019-08-12 ENCOUNTER — Encounter: Payer: Self-pay | Admitting: Internal Medicine

## 2019-08-12 ENCOUNTER — Ambulatory Visit (INDEPENDENT_AMBULATORY_CARE_PROVIDER_SITE_OTHER): Payer: 59 | Admitting: Internal Medicine

## 2019-08-12 ENCOUNTER — Other Ambulatory Visit (INDEPENDENT_AMBULATORY_CARE_PROVIDER_SITE_OTHER): Payer: 59

## 2019-08-12 ENCOUNTER — Other Ambulatory Visit: Payer: Self-pay

## 2019-08-12 VITALS — BP 142/78 | HR 64 | Temp 98.4°F | Resp 16 | Ht 63.75 in | Wt 183.0 lb

## 2019-08-12 DIAGNOSIS — E66811 Obesity, class 1: Secondary | ICD-10-CM

## 2019-08-12 DIAGNOSIS — E538 Deficiency of other specified B group vitamins: Secondary | ICD-10-CM | POA: Diagnosis not present

## 2019-08-12 DIAGNOSIS — F411 Generalized anxiety disorder: Secondary | ICD-10-CM | POA: Diagnosis not present

## 2019-08-12 DIAGNOSIS — R7303 Prediabetes: Secondary | ICD-10-CM

## 2019-08-12 DIAGNOSIS — I10 Essential (primary) hypertension: Secondary | ICD-10-CM

## 2019-08-12 DIAGNOSIS — E038 Other specified hypothyroidism: Secondary | ICD-10-CM | POA: Diagnosis not present

## 2019-08-12 DIAGNOSIS — F3289 Other specified depressive episodes: Secondary | ICD-10-CM

## 2019-08-12 DIAGNOSIS — R911 Solitary pulmonary nodule: Secondary | ICD-10-CM

## 2019-08-12 DIAGNOSIS — E559 Vitamin D deficiency, unspecified: Secondary | ICD-10-CM

## 2019-08-12 DIAGNOSIS — E669 Obesity, unspecified: Secondary | ICD-10-CM

## 2019-08-12 DIAGNOSIS — E7849 Other hyperlipidemia: Secondary | ICD-10-CM

## 2019-08-12 DIAGNOSIS — M3500 Sicca syndrome, unspecified: Secondary | ICD-10-CM

## 2019-08-12 DIAGNOSIS — M5417 Radiculopathy, lumbosacral region: Secondary | ICD-10-CM

## 2019-08-12 LAB — CBC WITH DIFFERENTIAL/PLATELET
Basophils Absolute: 0 10*3/uL (ref 0.0–0.1)
Basophils Relative: 0.3 % (ref 0.0–3.0)
Eosinophils Absolute: 0.1 10*3/uL (ref 0.0–0.7)
Eosinophils Relative: 2.7 % (ref 0.0–5.0)
HCT: 38.3 % (ref 36.0–46.0)
Hemoglobin: 12.8 g/dL (ref 12.0–15.0)
Lymphocytes Relative: 40.6 % (ref 12.0–46.0)
Lymphs Abs: 2 10*3/uL (ref 0.7–4.0)
MCHC: 33.4 g/dL (ref 30.0–36.0)
MCV: 91.7 fl (ref 78.0–100.0)
Monocytes Absolute: 0.4 10*3/uL (ref 0.1–1.0)
Monocytes Relative: 8.2 % (ref 3.0–12.0)
Neutro Abs: 2.4 10*3/uL (ref 1.4–7.7)
Neutrophils Relative %: 48.2 % (ref 43.0–77.0)
Platelets: 291 10*3/uL (ref 150.0–400.0)
RBC: 4.18 Mil/uL (ref 3.87–5.11)
RDW: 15.1 % (ref 11.5–15.5)
WBC: 4.9 10*3/uL (ref 4.0–10.5)

## 2019-08-12 LAB — COMPREHENSIVE METABOLIC PANEL
ALT: 14 U/L (ref 0–35)
AST: 15 U/L (ref 0–37)
Albumin: 3.7 g/dL (ref 3.5–5.2)
Alkaline Phosphatase: 54 U/L (ref 39–117)
BUN: 12 mg/dL (ref 6–23)
CO2: 28 mEq/L (ref 19–32)
Calcium: 9 mg/dL (ref 8.4–10.5)
Chloride: 104 mEq/L (ref 96–112)
Creatinine, Ser: 0.76 mg/dL (ref 0.40–1.20)
GFR: 78.28 mL/min (ref >60.00)
Glucose, Bld: 81 mg/dL (ref 70–99)
Potassium: 3.7 mEq/L (ref 3.5–5.1)
Sodium: 140 mEq/L (ref 135–145)
Total Bilirubin: 0.3 mg/dL (ref 0.2–1.2)
Total Protein: 7 g/dL (ref 6.0–8.3)

## 2019-08-12 LAB — VITAMIN D 25 HYDROXY (VIT D DEFICIENCY, FRACTURES): VITD: 16.93 ng/mL — ABNORMAL LOW (ref 30.00–100.00)

## 2019-08-12 LAB — VITAMIN B12: Vitamin B-12: 452 pg/mL (ref 211–911)

## 2019-08-12 LAB — LIPID PANEL
Cholesterol: 249 mg/dL — ABNORMAL HIGH (ref 0–200)
HDL: 57.6 mg/dL (ref >39.00)
LDL Cholesterol: 170 mg/dL — ABNORMAL HIGH (ref 0–99)
NonHDL: 191.63
Total CHOL/HDL Ratio: 4
Triglycerides: 108 mg/dL (ref 0.0–149.0)
VLDL: 21.6 mg/dL (ref 0.0–40.0)

## 2019-08-12 LAB — HEMOGLOBIN A1C: Hgb A1c MFr Bld: 5.7 % (ref 4.6–6.5)

## 2019-08-12 NOTE — Assessment & Plan Note (Signed)
Check lipid panel, CMP Continue Crestor

## 2019-08-12 NOTE — Assessment & Plan Note (Signed)
Managed by Dr Loanne Drilling

## 2019-08-12 NOTE — Assessment & Plan Note (Signed)
Following with rheumatology Symptoms improved with hydroxychloroquine

## 2019-08-12 NOTE — Assessment & Plan Note (Signed)
Taking B12 daily We will check level

## 2019-08-12 NOTE — Assessment & Plan Note (Signed)
-  Taking calcium and vitamin D Check level

## 2019-08-12 NOTE — Assessment & Plan Note (Signed)
Check a1c Low sugar / carb diet Stressed regular exercise   

## 2019-08-12 NOTE — Assessment & Plan Note (Signed)
Overall controlled Continue Lexapro 20 mg daily for now Next year may consider trying 10 mg daily

## 2019-08-12 NOTE — Assessment & Plan Note (Signed)
With hyperlipidemia, hypertension Has made good changes in her diet Has started to exercise Continue efforts

## 2019-08-12 NOTE — Assessment & Plan Note (Signed)
She has been out of her medication for a few days, but overall her blood pressure is well controlled Continue current medication at current dose CMP

## 2019-08-12 NOTE — Assessment & Plan Note (Signed)
Experiencing numbness lateral aspect of the distal right foot, she just recently experienced some pain in her right buttock and posterior thigh Likely S1 radiculopathy Discussed options Monitor for now

## 2019-08-12 NOTE — Assessment & Plan Note (Signed)
Overall controlled Continue Lexapro 20 mg daily Has Valium, but has not taken in a very long time

## 2019-08-13 MED FILL — LOSARTAN POTASSIUM 50 MG TA: 50 | 30 days supply | Qty: 30 | Fill #4

## 2019-08-15 ENCOUNTER — Encounter: Payer: Self-pay | Admitting: Internal Medicine

## 2019-08-26 ENCOUNTER — Encounter: Payer: Self-pay | Admitting: Gastroenterology

## 2019-08-26 ENCOUNTER — Ambulatory Visit (AMBULATORY_SURGERY_CENTER): Payer: 59 | Admitting: *Deleted

## 2019-08-26 ENCOUNTER — Other Ambulatory Visit: Payer: Self-pay

## 2019-08-26 VITALS — Temp 96.8°F | Ht 63.75 in | Wt 185.0 lb

## 2019-08-26 DIAGNOSIS — Z86010 Personal history of colonic polyps: Secondary | ICD-10-CM

## 2019-08-26 DIAGNOSIS — Z85038 Personal history of other malignant neoplasm of large intestine: Secondary | ICD-10-CM

## 2019-08-26 DIAGNOSIS — Z1159 Encounter for screening for other viral diseases: Secondary | ICD-10-CM

## 2019-08-26 MED ORDER — NA SULFATE-K SULFATE-MG SULF 17.5-3.13-1.6 GM/177ML PO SOLN: ORAL | 0 refills | Status: DC

## 2019-08-26 MED FILL — SUPREP BOWEL PREP KIT: 17.5-3.13-1 | 1 days supply | Qty: 354 | Fill #0

## 2019-08-26 NOTE — Progress Notes (Signed)
Patient is here in-person for PV. Patient denies any allergies to eggs or soy. Patient denies any problems with anesthesia/sedation. Patient denies any oxygen use at home. Patient denies taking any diet/weight loss medications or blood thinners. Patient is not being treated for MRSA or C-diff. EMMI education declined by the pt. COVID-19 screening test is on 12/17 240pm, the pt is aware. Pt is aware that care partner will wait in the car during procedure; if they feel like they will be too hot or cold to wait in the car; they may wait in the 4 th floor lobby. Patient is aware to bring only one care partner. We want them to wear a mask (we do not have any that we can provide them), practice social distancing, and we will check their temperatures when they get here.  I did remind the patient that their care partner needs to stay in the parking lot the entire time and have a cell phone available, we will call them when the pt is ready for discharge. Patient will wear mask into building.    Pt denies taking Phentermine and she is aware not to take this 10days  before procedure.

## 2019-08-27 ENCOUNTER — Ambulatory Visit (INDEPENDENT_AMBULATORY_CARE_PROVIDER_SITE_OTHER)
Admission: RE | Admit: 2019-08-27 | Discharge: 2019-08-27 | Disposition: A | Discharge status: Home / Self Care | Payer: 59 | Source: Ambulatory Visit | Attending: Internal Medicine | Admitting: Internal Medicine

## 2019-08-27 DIAGNOSIS — R918 Other nonspecific abnormal finding of lung field: Secondary | ICD-10-CM | POA: Diagnosis not present

## 2019-08-27 DIAGNOSIS — R911 Solitary pulmonary nodule: Secondary | ICD-10-CM | POA: Diagnosis not present

## 2019-08-29 ENCOUNTER — Ambulatory Visit (INDEPENDENT_AMBULATORY_CARE_PROVIDER_SITE_OTHER): Payer: 59

## 2019-08-29 ENCOUNTER — Encounter: Payer: Self-pay | Admitting: Internal Medicine

## 2019-08-29 ENCOUNTER — Other Ambulatory Visit: Payer: Self-pay | Admitting: Internal Medicine

## 2019-08-29 DIAGNOSIS — Z1159 Encounter for screening for other viral diseases: Secondary | ICD-10-CM

## 2019-08-29 MED FILL — tiZANidine HCL 4 MG TABS: 4 | 30 days supply | Qty: 60 | Fill #0

## 2019-08-30 ENCOUNTER — Other Ambulatory Visit: Payer: Self-pay | Admitting: Internal Medicine

## 2019-08-30 ENCOUNTER — Ambulatory Visit
Admission: RE | Admit: 2019-08-30 | Discharge: 2019-08-30 | Disposition: A | Discharge status: Home / Self Care | Payer: Self-pay | Source: Ambulatory Visit | Attending: Internal Medicine | Admitting: Internal Medicine

## 2019-08-30 DIAGNOSIS — R0781 Pleurodynia: Secondary | ICD-10-CM

## 2019-08-30 LAB — SARS CORONAVIRUS 2 (TAT 6-24 HRS): SARS Coronavirus 2: NEGATIVE

## 2019-08-31 ENCOUNTER — Encounter: Payer: Self-pay | Admitting: Internal Medicine

## 2019-09-02 ENCOUNTER — Encounter: Payer: Self-pay | Admitting: Gastroenterology

## 2019-09-02 ENCOUNTER — Other Ambulatory Visit: Payer: Self-pay

## 2019-09-02 ENCOUNTER — Ambulatory Visit (AMBULATORY_SURGERY_CENTER): Payer: 59 | Admitting: Gastroenterology

## 2019-09-02 VITALS — BP 141/80 | HR 60 | Temp 97.7°F | Resp 18 | Ht 63.75 in | Wt 185.0 lb

## 2019-09-02 DIAGNOSIS — Z85038 Personal history of other malignant neoplasm of large intestine: Secondary | ICD-10-CM | POA: Diagnosis not present

## 2019-09-02 DIAGNOSIS — Z1211 Encounter for screening for malignant neoplasm of colon: Secondary | ICD-10-CM | POA: Diagnosis not present

## 2019-09-02 DIAGNOSIS — D124 Benign neoplasm of descending colon: Secondary | ICD-10-CM

## 2019-09-02 MED ORDER — SODIUM CHLORIDE 0.9 % IV SOLN: 500.0000 mL | Freq: Once | INTRAVENOUS | Status: DC

## 2019-09-02 NOTE — Progress Notes (Signed)
Vitals by DT Temp by JB  Pt's states no medical or surgical changes since previsit or office visit.

## 2019-09-02 NOTE — Op Note (Signed)
[facility] Patient Name: Margaret Green Procedure Date: 09/02/2019 9:37 AM MRN: [id number] Endoscopist: Remo Lipps P. Havery Moros , MD Age: 58 Referring MD:  Date of Birth: 1961/03/28 Gender: Female Account #: [id number] Procedure:                Colonoscopy Indications:              High risk colon cancer surveillance: Personal                            history of malignant polyp removed endoscopically                            in 2016, last colonoscopy in 2017. Medicines:                Monitored Anesthesia Care Procedure:                Pre-Anesthesia Assessment:                           - Prior to the procedure, a History and Physical                            was performed, and patient medications and                            allergies were reviewed. The patient's tolerance of                            previous anesthesia was also reviewed. The risks                            and benefits of the procedure and the sedation                            options and risks were discussed with the patient.                            All questions were answered, and informed consent                            was obtained. Prior Anticoagulants: The patient has                            taken no previous anticoagulant or antiplatelet                            agents. ASA Grade Assessment: II - A patient with                            mild systemic disease. After reviewing the risks                            and benefits, the patient was deemed in  satisfactory condition to undergo the procedure.                           After obtaining informed consent, the colonoscope                            was passed under direct vision. Throughout the                            procedure, the patient's blood pressure, pulse, and                            oxygen saturations were monitored continuously. The                            Colonoscope was  introduced through the anus and                            advanced to the the cecum, identified by                            appendiceal orifice and ileocecal valve. The                            colonoscopy was performed without difficulty. The                            patient tolerated the procedure well. The quality                            of the bowel preparation was good. The ileocecal                            valve, appendiceal orifice, and rectum were                            photographed. Scope In: 9:41:29 AM Scope Out: 10:00:34 AM Scope Withdrawal Time: 0 hours 16 minutes 19 seconds  Total Procedure Duration: 0 hours 19 minutes 5 seconds  Findings:                 The perianal and digital rectal examinations were                            normal.                           A 3 mm polyp was found in the descending colon. The                            polyp was sessile. The polyp was removed with a                            cold snare. Resection and retrieval were complete.  A few small-mouthed diverticula were found in the                            sigmoid colon.                           The exam was otherwise without abnormality. Complications:            No immediate complications. Estimated blood loss:                            Minimal. Estimated Blood Loss:     Estimated blood loss was minimal. Impression:               - One 3 mm polyp in the descending colon, removed                            with a cold snare. Resected and retrieved.                           - Diverticulosis in the sigmoid colon.                           - The examination was otherwise normal. Recommendation:           - Patient has a contact number available for                            emergencies. The signs and symptoms of potential                            delayed complications were discussed with the                            patient. Return to normal  activities tomorrow.                            Written discharge instructions were provided to the                            patient.                           - Resume previous diet.                           - Continue present medications.                           - Await pathology results. Anticipate repeat                            colonoscopy in 5 years for surveillance. Remo Lipps P. Karl Erway, MD 09/02/2019 10:03:54 AM This report has been signed electronically.

## 2019-09-02 NOTE — Progress Notes (Signed)
Called to room to assist during endoscopic procedure.  Patient ID and intended procedure confirmed with present staff. Received instructions for my participation in the procedure from the performing physician.  

## 2019-09-02 NOTE — Progress Notes (Signed)
A and O x3. Report to RN. Tolerated MAC anesthesia well.

## 2019-09-02 NOTE — Patient Instructions (Signed)
Information on polyps and diverticulosis given to you today.  Await pathology results.  YOU HAD AN ENDOSCOPIC PROCEDURE TODAY AT THE [facility]:   Refer to the procedure report that was given to you for any specific questions about what was found during the examination.  If the procedure report does not answer your questions, please call your gastroenterologist to clarify.  If you requested that your care partner not be given the details of your procedure findings, then the procedure report has been included in a sealed envelope for you to review at your convenience later.  YOU SHOULD EXPECT: Some feelings of bloating in the abdomen. Passage of more gas than usual.  Walking can help get rid of the air that was put into your GI tract during the procedure and reduce the bloating. If you had a lower endoscopy (such as a colonoscopy or flexible sigmoidoscopy) you may notice spotting of blood in your stool or on the toilet paper. If you underwent a bowel prep for your procedure, you may not have a normal bowel movement for a few days.  Please Note:  You might notice some irritation and congestion in your nose or some drainage.  This is from the oxygen used during your procedure.  There is no need for concern and it should clear up in a day or so.  SYMPTOMS TO REPORT IMMEDIATELY:   Following lower endoscopy (colonoscopy or flexible sigmoidoscopy):  Excessive amounts of blood in the stool  Significant tenderness or worsening of abdominal pains  Swelling of the abdomen that is new, acute  Fever of 100F or higher   For urgent or emergent issues, a gastroenterologist can be reached at any hour by calling [phone].   DIET:  We do recommend a small meal at first, but then you may proceed to your regular diet.  Drink plenty of fluids but you should avoid alcoholic beverages for 24 hours.  ACTIVITY:  You should plan to take it easy for the rest of today and you should NOT DRIVE or use  heavy machinery until tomorrow (because of the sedation medicines used during the test).    FOLLOW UP: Our staff will call the number listed on your records 48-72 hours following your procedure to check on you and address any questions or concerns that you may have regarding the information given to you following your procedure. If we do not reach you, we will leave a message.  We will attempt to reach you two times.  During this call, we will ask if you have developed any symptoms of COVID 19. If you develop any symptoms (ie: fever, flu-like symptoms, shortness of breath, cough etc.) before then, please call [phone].  If you test positive for Covid 19 in the 2 weeks post procedure, please call and report this information to us.    If any biopsies were taken you will be contacted by phone or by letter within the next 1-3 weeks.  Please call us at [phone] if you have not heard about the biopsies in 3 weeks.    SIGNATURES/CONFIDENTIALITY: You and/or your care partner have signed paperwork which will be entered into your electronic medical record.  These signatures attest to the fact that that the information above on your After Visit Summary has been reviewed and is understood.  Full responsibility of the confidentiality of this discharge information lies with you and/or your care-partner. 

## 2019-09-04 ENCOUNTER — Telehealth: Payer: Self-pay

## 2019-09-04 NOTE — Telephone Encounter (Signed)
  Follow up Call-  Call back number 09/02/2019  Post procedure Call Back phone  # [phone]  Permission to leave phone message Yes  Some recent data might be hidden     Patient questions:  Do you have a fever, pain , or abdominal swelling? No. Pain Score  0 *  Have you tolerated food without any problems? Yes.    Have you been able to return to your normal activities? Yes.    Do you have any questions about your discharge instructions: Diet   No. Medications  No. Follow up visit  No.  Do you have questions or concerns about your Care? No.  Actions: * If pain score is 4 or above: No action needed, pain <4.  1. Have you developed a fever since your procedure? no  2.   Have you had an respiratory symptoms (SOB or cough) since your procedure? no  3.   Have you tested positive for COVID 19 since your procedure no  4.   Have you had any family members/close contacts diagnosed with the COVID 19 since your procedure?  no   If yes to any of these questions please route to Joylene John, RN and Alphonsa Gin, Therapist, sports.

## 2019-09-12 MED FILL — LEVOTHYROXINE 88 MCG TABLET: 88 | 90 days supply | Qty: 90 | Fill #1

## 2019-09-12 MED FILL — GABAPENTIN 300 MG CAPSULE: 300 | 30 days supply | Qty: 30 | Fill #2

## 2019-09-12 MED FILL — PHENTERMINE 37.5 MG TABLET: 37.5 | 30 days supply | Qty: 30 | Fill #2

## 2019-09-17 ENCOUNTER — Other Ambulatory Visit: Payer: Self-pay | Admitting: Obstetrics & Gynecology

## 2019-09-17 MED FILL — ESCITALOPRAM 20 MG TABLET: 20 | 30 days supply | Qty: 30 | Fill #0

## 2019-09-17 MED FILL — HYDROXYCHLOROQUINE SULFATE: 200 | 30 days supply | Qty: 60 | Fill #2

## 2019-09-17 MED FILL — DOTTI 0.05 MG/24HR PTTW: 0.05 | 84 days supply | Qty: 24 | Fill #0

## 2019-09-17 MED FILL — LOSARTAN POTASSIUM 50 MG TA: 50 | 30 days supply | Qty: 30 | Fill #5

## 2019-10-04 MED FILL — ROSUVASTATIN CALCIUM 20 MG: 20 | 30 days supply | Qty: 30 | Fill #4

## 2019-10-04 MED FILL — CEVIMELINE HCL 30 MG CAPS: 30 | 30 days supply | Qty: 90 | Fill #2

## 2019-10-11 ENCOUNTER — Encounter: Payer: Self-pay | Admitting: Internal Medicine

## 2019-10-11 MED ORDER — DIPHENOXYLATE-ATROPINE 2.5-0.025 MG PO TABS: 1.0000 | ORAL_TABLET | Freq: Four times a day (QID) | ORAL | 0 refills | Status: DC | PRN

## 2019-10-11 MED ORDER — ONDANSETRON 4 MG PO TBDP: 4.0000 mg | ORAL_TABLET | Freq: Three times a day (TID) | ORAL | 0 refills | Status: DC | PRN

## 2019-10-11 MED FILL — DIPHENOXYLATE-ATROPINE 2.5-: 2.5-0.025 | 8 days supply | Qty: 30 | Fill #0

## 2019-10-11 MED FILL — ONDANSETRON ODT 4 MG TABLET: 4 | 7 days supply | Qty: 20 | Fill #0

## 2019-10-16 ENCOUNTER — Other Ambulatory Visit: Payer: Self-pay | Admitting: Internal Medicine

## 2019-10-16 MED FILL — LOSARTAN POTASSIUM 50 MG TA: 50 | 30 days supply | Qty: 30 | Fill #0

## 2019-10-16 MED FILL — ESCITALOPRAM 20 MG TABLET: 20 | 30 days supply | Qty: 30 | Fill #0

## 2019-10-17 ENCOUNTER — Ambulatory Visit: Payer: 59 | Attending: Internal Medicine

## 2019-10-18 ENCOUNTER — Encounter: Payer: Self-pay | Admitting: Family

## 2019-10-18 ENCOUNTER — Ambulatory Visit (INDEPENDENT_AMBULATORY_CARE_PROVIDER_SITE_OTHER): Payer: 59 | Admitting: Family

## 2019-10-18 DIAGNOSIS — J019 Acute sinusitis, unspecified: Secondary | ICD-10-CM | POA: Diagnosis not present

## 2019-10-18 MED ORDER — AMOXICILLIN-POT CLAVULANATE 875-125 MG PO TABS: 1.0000 | ORAL_TABLET | Freq: Two times a day (BID) | ORAL | 0 refills | Status: AC

## 2019-10-18 NOTE — Progress Notes (Signed)
Margaret Green is a 59 y.o. female with the following history as recorded in EpicCare:  Patient Active Problem List   Diagnosis Date Noted  . Lumbosacral radiculopathy at S1 08/12/2019  . Vitamin D deficiency 08/12/2019  . Obesity (BMI 30.0-34.9) 02/05/2019  . Vertigo 07/17/2018  . B12 deficiency 07/02/2018  . Prediabetes 12/23/2017  . Osteopenia 08/10/2017  . Multinodular goiter 03/28/2017  . Chest pain with low risk for cardiac etiology 01/02/2017  . Sjogren's disease ([place]) 08/27/2015  . Depression 07/23/2015  . Adenocarcinoma, colon ([place]) 07/23/2015  . Migraine 08/14/2014  . Allergic rhinitis 08/14/2014  . IBS (irritable bowel syndrome) 08/14/2014  . Essential hypertension, benign 08/14/2014  . GERD (gastroesophageal reflux disease) 08/14/2014  . Right thyroid nodule 09/24/2013  . GAD (generalized anxiety disorder) 09/29/2010  . ANEMIA, PERNICIOUS 02/24/2009  . Hyperlipidemia 04/13/2007  . RAYNAUD'S DISEASE 04/13/2007  . Osteoarthritis 04/13/2007  . Fibromyalgia 04/13/2007  . Hypothyroidism 04/03/2007    Current Outpatient Medications  Medication Sig Dispense Refill  . amoxicillin-clavulanate (AUGMENTIN) 875-125 MG tablet Take 1 tablet by mouth 2 (two) times daily for 10 days. 20 tablet 0  . Calcium Carbonate-Vitamin D (CALCIUM 600+D) 600-400 MG-UNIT tablet Take 1 tablet by mouth daily.    . cevimeline (EVOXAC) 30 MG capsule     . diazepam (VALIUM) 2 MG tablet Take 1-2 tablets (2-4 mg total) by mouth every 8 (eight) hours as needed for anxiety. 15 tablet 0  . diphenoxylate-atropine (LOMOTIL) 2.5-0.025 MG tablet Take 1 tablet by mouth 4 (four) times daily as needed for diarrhea or loose stools. 30 tablet 0  . DOTTI 0.05 MG/24HR patch PLACE 1 PATCH (0.05 MG TOTAL) ONTO THE SKIN 2 (TWO) TIMES A WEEK. PT PLACES ON WEDNESDAY AND SUNDAY. 24 patch 3  . escitalopram (LEXAPRO) 20 MG tablet TAKE 1 TABLET BY MOUTH ONCE DAILY 30 tablet 5  . gabapentin (NEURONTIN) 300 MG capsule  Take 300 mg by mouth as needed.    . hydroxychloroquine (PLAQUENIL) 200 MG tablet     . ipratropium (ATROVENT) 0.06 % nasal spray Place 2 sprays into both nostrils 4 (four) times daily. 15 mL 1  . levothyroxine (SYNTHROID) 88 MCG tablet TAKE 1 TABLET BY MOUTH DAILY BEFORE BREAKFAST. 90 tablet 3  . losartan (COZAAR) 50 MG tablet TAKE 1 TABLET (50 MG TOTAL) BY MOUTH DAILY. 30 tablet 5  . ondansetron (ZOFRAN ODT) 4 MG disintegrating tablet Take 1 tablet (4 mg total) by mouth every 8 (eight) hours as needed for nausea or vomiting. 20 tablet 0  . rosuvastatin (CRESTOR) 20 MG tablet TAKE 1 TABLET (20 MG TOTAL) BY MOUTH DAILY. 30 tablet 5  . tiZANidine (ZANAFLEX) 4 MG tablet TAKE 1 TABLET BY MOUTH TWICE DAILY AS NEEDED MUSCLE SPASM 60 tablet 0   No current facility-administered medications for this visit.    Allergies: Abilify [aripiprazole], Codeine, Omeprazole, Meloxicam, Oxycodone-aspirin, and Sulfamethoxazole  Past Medical History:  Diagnosis Date  . Adenocarcinoma, colon ([place]) 07/23/2015   Removed completely by polypectomy 06/2015   . Allergy   . Anxiety   . Arthritis   . Endometriosis 08/19/2009   Hysterectomy.     . Family history of diabetes mellitus   . GERD (gastroesophageal reflux disease)   . Headache(784.0)   . Heart murmur    past hx of heart murmur  . Hyperlipidemia   . Hypertension   . Hypothyroidism   . Neutropenia   . Raynaud's syndrome   . Sjogrens syndrome ([place])   .  Stricture and stenosis of esophagus     Past Surgical History:  Procedure Laterality Date  . ABDOMINAL HYSTERECTOMY    . BREAST BIOPSY     benign  . BREAST REDUCTION SURGERY    . COLONOSCOPY  08/19/2016  . HERNIA REPAIR    . POLYPECTOMY    . REDUCTION MAMMAPLASTY    . UPPER GASTROINTESTINAL ENDOSCOPY      Family History  Problem Relation Age of Onset  . Coronary artery disease Father        age 82 - 23 stents, all 3 siblings with early cardiac disease  . Colon polyps Father   . Heart attack  Father 31  . Heart disease Father   . Cirrhosis Mother        alcohol related  . Alcohol abuse Mother   . Ovarian cancer Maternal Aunt   . Stomach cancer Paternal Aunt   . Esophageal cancer Paternal Aunt   . Diabetes Paternal Grandmother        and aunt  . Colon polyps Sister   . Heart attack Paternal Uncle   . Colon cancer Neg Hx   . Rectal cancer Neg Hx     Social History   Tobacco Use  . Smoking status: Never Smoker  . Smokeless tobacco: Never Used  Substance Use Topics  . Alcohol use: Yes    Alcohol/week: 0.0 standard drinks    Comment: occasionally    Subjective:    I connected with Margaret Green on 10/18/19 at  1:20 PM EST by a video enabled telemedicine application and verified that I am speaking with the correct person using two identifiers.   I discussed the limitations of evaluation and management by telemedicine and the availability of in person appointments. The patient expressed understanding and agreed to proceed.  Provider in office/ patient is at home; provider and patient are only 2 people on video call.   Patient is concerned that she has a sinus infection- has symptoms suggestive of viral infection including congestion, body aches, chills, runny nose but recent COVID test was negative; notes that she started "feeling bad" earlier this week; + facial pressure; no fever;  Purchased Flonase yesterday but has not started taking yet;    Objective:  There were no vitals filed for this visit.  General: Well developed, well nourished, in no acute distress  Head: Normocephalic and atraumatic  Lungs: Respirations unlabored;  Neurologic: Alert and oriented; speech intact; face symmetrical;  Assessment:  1. Acute sinusitis, recurrence not specified, unspecified location     Plan:  Rx for Augmentin 875 mg bid x 10 days; encouraged to use Flonase and Ibuprofen; increase fluids, rest and follow-up worse, no better.   No follow-ups on file.  No orders of  the defined types were placed in this encounter.   Requested Prescriptions   Signed Prescriptions Disp Refills  . amoxicillin-clavulanate (AUGMENTIN) 875-125 MG tablet 20 tablet 0    Sig: Take 1 tablet by mouth 2 (two) times daily for 10 days.

## 2019-10-21 ENCOUNTER — Other Ambulatory Visit: Payer: Self-pay | Admitting: Internal Medicine

## 2019-10-21 MED FILL — GABAPENTIN 300 MG CAPSULE: 300 | 30 days supply | Qty: 30 | Fill #3

## 2019-10-22 MED FILL — PHENTERMINE 37.5 MG TABLET: 37.5 | 30 days supply | Qty: 30 | Fill #0

## 2019-10-22 MED FILL — tiZANidine HCL 4 MG TABS: 4 | 30 days supply | Qty: 60 | Fill #0

## 2019-10-30 MED FILL — HYDROXYCHLOROQUINE SULFATE: 200 | 30 days supply | Qty: 60 | Fill #3

## 2019-11-12 DIAGNOSIS — E669 Obesity, unspecified: Secondary | ICD-10-CM | POA: Diagnosis not present

## 2019-11-12 DIAGNOSIS — E041 Nontoxic single thyroid nodule: Secondary | ICD-10-CM | POA: Diagnosis not present

## 2019-11-12 DIAGNOSIS — D72820 Lymphocytosis (symptomatic): Secondary | ICD-10-CM | POA: Diagnosis not present

## 2019-11-12 DIAGNOSIS — M3501 Sicca syndrome with keratoconjunctivitis: Secondary | ICD-10-CM | POA: Diagnosis not present

## 2019-11-12 DIAGNOSIS — Z6830 Body mass index (BMI) 30.0-30.9, adult: Secondary | ICD-10-CM | POA: Diagnosis not present

## 2019-11-12 DIAGNOSIS — I7300 Raynaud's syndrome without gangrene: Secondary | ICD-10-CM | POA: Diagnosis not present

## 2019-11-12 DIAGNOSIS — M797 Fibromyalgia: Secondary | ICD-10-CM | POA: Diagnosis not present

## 2019-11-12 DIAGNOSIS — K029 Dental caries, unspecified: Secondary | ICD-10-CM | POA: Diagnosis not present

## 2019-11-12 MED FILL — CEVIMELINE HCL 30 MG CAPS: 30 | 90 days supply | Qty: 270 | Fill #0

## 2019-11-19 ENCOUNTER — Encounter: Payer: Self-pay | Admitting: Internal Medicine

## 2019-11-19 MED FILL — GABAPENTIN 300 MG CAPSULE: 300 | 30 days supply | Qty: 30 | Fill #0

## 2019-11-19 MED FILL — LOSARTAN POTASSIUM 50 MG TA: 50 | 30 days supply | Qty: 30 | Fill #1

## 2019-11-19 MED FILL — ESCITALOPRAM 20 MG TABLET: 20 | 30 days supply | Qty: 30 | Fill #1

## 2019-11-20 ENCOUNTER — Other Ambulatory Visit: Payer: Self-pay

## 2019-11-20 MED ORDER — FLUTICASONE PROPIONATE 50 MCG/ACT NA SUSP: 2.0000 | Freq: Every day | NASAL | 2 refills | Status: DC

## 2019-11-20 MED FILL — FLUTICASONE PROP 50 MCG SPR: 50 | 30 days supply | Qty: 16 | Fill #0

## 2019-11-29 MED FILL — ROSUVASTATIN CALCIUM 20 MG: 20 | 30 days supply | Qty: 30 | Fill #5

## 2019-12-02 ENCOUNTER — Other Ambulatory Visit: Payer: Self-pay | Admitting: Internal Medicine

## 2019-12-02 MED FILL — tiZANidine HCL 4 MG TABS: 4 | 30 days supply | Qty: 60 | Fill #0

## 2019-12-09 ENCOUNTER — Encounter: Payer: Self-pay | Admitting: Internal Medicine

## 2019-12-09 ENCOUNTER — Ambulatory Visit (INDEPENDENT_AMBULATORY_CARE_PROVIDER_SITE_OTHER): Payer: 59 | Admitting: Internal Medicine

## 2019-12-09 DIAGNOSIS — J019 Acute sinusitis, unspecified: Secondary | ICD-10-CM

## 2019-12-09 MED ORDER — AMOXICILLIN-POT CLAVULANATE 875-125 MG PO TABS: 1.0000 | ORAL_TABLET | Freq: Two times a day (BID) | ORAL | 0 refills | Status: DC

## 2019-12-09 MED ORDER — HYDROCOD POLST-CPM POLST ER 10-8 MG/5ML PO SUER: 5.0000 mL | Freq: Two times a day (BID) | ORAL | 0 refills | Status: DC | PRN

## 2019-12-09 MED FILL — HYDROXYCHLOROQUINE SULFATE: 200 | 30 days supply | Qty: 60 | Fill #4

## 2019-12-09 MED FILL — HYDROCODONE-CHLORPHEN ER SU: 10-8 | 10 days supply | Qty: 100 | Fill #0

## 2019-12-09 MED FILL — AMOX-CLAV 875-125 MG TABLET: 875-125 | 10 days supply | Qty: 20 | Fill #0

## 2019-12-09 NOTE — Assessment & Plan Note (Signed)
Likely bacterial, especially given her history of Sjogren's Start augmentin Prescription cough syrup otc cold medications Rest, fluid Call if no improvement

## 2019-12-09 NOTE — Progress Notes (Signed)
Virtual Visit via Video Note  I connected with Margaret Green on 12/09/19 at 10:30 AM EDT by a video enabled telemedicine application and verified that I am speaking with the correct person using two identifiers.   I discussed the limitations of evaluation and management by telemedicine and the availability of in person appointments. The patient expressed understanding and agreed to proceed.  Present for the visit:  Myself, Dr Billey Gosling, [facility].  The patient is currently at home and I am in the office.    No referring provider.    History of Present Illness: She is here for an acute visit for cold symptoms.   Her symptoms started several days ago.  She is experiencing sore throat, nasal congestion with yellow discharge, right ear fullness, frontal and orbital headache, dry cough, some chest tightness this morning only.  She has not had any significant sinus pain, fevers, wheezing or shortness of breath.  She did have some body aches, but those have resolved.  She has been taking Advil Cold and Sinus and other allergy/cold medications without improvement.  She has a history of sinus infections and was treated for them last month.     Review of Systems  Constitutional: Negative for fever.  HENT: Positive for congestion, ear pain and sore throat. Negative for sinus pain.   Respiratory: Positive for cough (dry). Negative for shortness of breath and wheezing.   Musculoskeletal: Positive for myalgias.  Neurological: Positive for headaches.      Social History   Socioeconomic History  . Marital status: Single    Spouse name: Not on file  . Number of children: Not on file  . Years of education: Not on file  . Highest education level: Not on file  Occupational History  . Not on file  Tobacco Use  . Smoking status: Never Smoker  . Smokeless tobacco: Never Used  Substance and Sexual Activity  . Alcohol use: Yes    Alcohol/week: 0.0 standard drinks    Comment:  occasionally  . Drug use: No  . Sexual activity: Not Currently    Comment: 1st intercourse- 21, partners- 3,   Other Topics Concern  . Not on file  Social History Narrative   Partner for 2 years in 2013. Prior 20 year relationship-still friends. No children. 1 dog-peekapoo.       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       Hobbies: golf, cooking, gardening, poker   Social Determinants of Health   Financial Resource Strain:   . Difficulty of Paying Living Expenses:   Food Insecurity:   . Worried About Charity fundraiser in the Last Year:   . Arboriculturist in the Last Year:   Transportation Needs:   . Film/video editor (Medical):   Marland Kitchen Lack of Transportation (Non-Medical):   Physical Activity:   . Days of Exercise per Week:   . Minutes of Exercise per Session:   Stress:   . Feeling of Stress :   Social Connections:   . Frequency of Communication with Friends and Family:   . Frequency of Social Gatherings with Friends and Family:   . Attends Religious Services:   . Active Member of Clubs or Organizations:   . Attends Archivist Meetings:   Marland Kitchen Marital Status:      Observations/Objective: Appears well in NAD Breathing normally and speaking in full sentences Skin appears warm and dry  Assessment and Plan:  See Problem List for Assessment and Plan of chronic medical problems.   Follow Up Instructions:    I discussed the assessment and treatment plan with the patient. The patient was provided an opportunity to ask questions and all were answered. The patient agreed with the plan and demonstrated an understanding of the instructions.   The patient was advised to call back or seek an in-person evaluation if the symptoms worsen or if the condition fails to improve as anticipated.    Binnie Rail, MD

## 2019-12-17 ENCOUNTER — Encounter: Payer: Self-pay | Admitting: Internal Medicine

## 2019-12-17 MED ORDER — METHYLPREDNISOLONE 4 MG PO TBPK: ORAL_TABLET | ORAL | 0 refills | Status: DC

## 2019-12-17 MED FILL — METHYLPREDNISOLONE 4 MG TAB: 4 | 6 days supply | Qty: 21 | Fill #0

## 2019-12-18 MED FILL — LIDOCAINE 2% VISCOUS SOLN: 2 | 14 days supply | Qty: 200 | Fill #0

## 2019-12-19 MED FILL — LOSARTAN POTASSIUM 50 MG TA: 50 | 30 days supply | Qty: 30 | Fill #2

## 2019-12-20 MED FILL — ESCITALOPRAM 20 MG TABLET: 20 | 30 days supply | Qty: 30 | Fill #2

## 2020-01-06 MED FILL — FLUTICASONE PROP 50 MCG SPR: 50 | 30 days supply | Qty: 16 | Fill #1

## 2020-01-07 MED FILL — GABAPENTIN 300 MG CAPSULE: 300 | 30 days supply | Qty: 30 | Fill #1

## 2020-01-07 MED FILL — LEVOTHYROXINE 88 MCG TABLET: 88 | 90 days supply | Qty: 90 | Fill #2

## 2020-01-10 ENCOUNTER — Other Ambulatory Visit: Payer: Self-pay | Admitting: Internal Medicine

## 2020-01-10 MED FILL — tiZANidine HCL 4 MG TABS: 4 | 30 days supply | Qty: 60 | Fill #0

## 2020-01-10 MED FILL — PHENTERMINE 37.5 MG TABLET: 37.5 | 30 days supply | Qty: 30 | Fill #1

## 2020-01-13 ENCOUNTER — Other Ambulatory Visit: Payer: Self-pay | Admitting: Internal Medicine

## 2020-01-13 MED FILL — ROSUVASTATIN CALCIUM 20 MG: 20 | 30 days supply | Qty: 30 | Fill #0

## 2020-02-06 NOTE — Progress Notes (Signed)
Subjective:    Patient ID: Margaret Green, female    DOB: 09-20-1960, 59 y.o.   MRN: [id number]  HPI The patient is here for follow up of their chronic medical problems, including htn, hyperlipidemia, hypothyroidism, B12 def, prediabetes, depression, anxiety.  She is taking all of her medications as prescribed.    She is exercising regularly.   She rides a stationary bike.      Medications and allergies reviewed with patient and updated if appropriate.  Patient Active Problem List   Diagnosis Date Noted  . Acute sinus infection 12/09/2019  . Lumbosacral radiculopathy at S1 08/12/2019  . Vitamin D deficiency 08/12/2019  . Obesity (BMI 30.0-34.9) 02/05/2019  . Vertigo 07/17/2018  . B12 deficiency 07/02/2018  . Prediabetes 12/23/2017  . Osteopenia 08/10/2017  . Multinodular goiter 03/28/2017  . Chest pain with low risk for cardiac etiology 01/02/2017  . Sjogren's disease ([place]) 08/27/2015  . Depression 07/23/2015  . Adenocarcinoma, colon ([place]) 07/23/2015  . Migraine 08/14/2014  . Allergic rhinitis 08/14/2014  . IBS (irritable bowel syndrome) 08/14/2014  . Essential hypertension, benign 08/14/2014  . GERD (gastroesophageal reflux disease) 08/14/2014  . Right thyroid nodule 09/24/2013  . GAD (generalized anxiety disorder) 09/29/2010  . ANEMIA, PERNICIOUS 02/24/2009  . Hyperlipidemia 04/13/2007  . RAYNAUD'S DISEASE 04/13/2007  . Osteoarthritis 04/13/2007  . Fibromyalgia 04/13/2007  . Hypothyroidism 04/03/2007    Current Outpatient Medications on File Prior to Visit  Medication Sig Dispense Refill  . amoxicillin-clavulanate (AUGMENTIN) 875-125 MG tablet Take 1 tablet by mouth 2 (two) times daily. 20 tablet 0  . Calcium Carbonate-Vitamin D (CALCIUM 600+D) 600-400 MG-UNIT tablet Take 1 tablet by mouth daily.    . cevimeline (EVOXAC) 30 MG capsule     . chlorpheniramine-HYDROcodone (TUSSIONEX PENNKINETIC ER) 10-8 MG/5ML SUER Take 5 mLs by mouth every 12 (twelve) hours  as needed for cough. 100 mL 0  . diazepam (VALIUM) 2 MG tablet Take 1-2 tablets (2-4 mg total) by mouth every 8 (eight) hours as needed for anxiety. 15 tablet 0  . diphenoxylate-atropine (LOMOTIL) 2.5-0.025 MG tablet Take 1 tablet by mouth 4 (four) times daily as needed for diarrhea or loose stools. 30 tablet 0  . DOTTI 0.05 MG/24HR patch PLACE 1 PATCH (0.05 MG TOTAL) ONTO THE SKIN 2 (TWO) TIMES A WEEK. PT PLACES ON WEDNESDAY AND SUNDAY. 24 patch 3  . escitalopram (LEXAPRO) 20 MG tablet TAKE 1 TABLET BY MOUTH ONCE DAILY 30 tablet 5  . fluticasone (FLONASE) 50 MCG/ACT nasal spray Place 2 sprays into both nostrils daily. 16 g 2  . gabapentin (NEURONTIN) 300 MG capsule Take 300 mg by mouth as needed.    . hydroxychloroquine (PLAQUENIL) 200 MG tablet     . ipratropium (ATROVENT) 0.06 % nasal spray Place 2 sprays into both nostrils 4 (four) times daily. 15 mL 1  . levothyroxine (SYNTHROID) 88 MCG tablet TAKE 1 TABLET BY MOUTH DAILY BEFORE BREAKFAST. 90 tablet 3  . losartan (COZAAR) 50 MG tablet TAKE 1 TABLET (50 MG TOTAL) BY MOUTH DAILY. 30 tablet 5  . methylPREDNISolone (MEDROL DOSEPAK) 4 MG TBPK tablet 24 mg PO on day 1, then decr. by 4 mg/day x5 days 21 tablet 0  . ondansetron (ZOFRAN ODT) 4 MG disintegrating tablet Take 1 tablet (4 mg total) by mouth every 8 (eight) hours as needed for nausea or vomiting. 20 tablet 0  . phentermine (ADIPEX-P) 37.5 MG tablet TAKE 1 TABLET BY MOUTH EVERY MORNING. 30 tablet 2  .  rosuvastatin (CRESTOR) 20 MG tablet TAKE 1 TABLET (20 MG TOTAL) BY MOUTH DAILY. 30 tablet 1  . tiZANidine (ZANAFLEX) 4 MG tablet TAKE 1 TABLET BY MOUTH TWICE A DAY AS NEEDED FOR MUSCLE SPASM 60 tablet 0   No current facility-administered medications on file prior to visit.    Past Medical History:  Diagnosis Date  . Adenocarcinoma, colon ([place]) 07/23/2015   Removed completely by polypectomy 06/2015   . Allergy   . Anxiety   . Arthritis   . Endometriosis 08/19/2009   Hysterectomy.     .  Family history of diabetes mellitus   . GERD (gastroesophageal reflux disease)   . Headache(784.0)   . Heart murmur    past hx of heart murmur  . Hyperlipidemia   . Hypertension   . Hypothyroidism   . Neutropenia   . Raynaud's syndrome   . Sjogrens syndrome ([place])   . Stricture and stenosis of esophagus     Past Surgical History:  Procedure Laterality Date  . ABDOMINAL HYSTERECTOMY    . BREAST BIOPSY     benign  . BREAST REDUCTION SURGERY    . COLONOSCOPY  08/19/2016  . HERNIA REPAIR    . POLYPECTOMY    . REDUCTION MAMMAPLASTY    . UPPER GASTROINTESTINAL ENDOSCOPY      Social History   Socioeconomic History  . Marital status: Single    Spouse name: Not on file  . Number of children: Not on file  . Years of education: Not on file  . Highest education level: Not on file  Occupational History  . Not on file  Tobacco Use  . Smoking status: Never Smoker  . Smokeless tobacco: Never Used  Substance and Sexual Activity  . Alcohol use: Yes    Alcohol/week: 0.0 standard drinks    Comment: occasionally  . Drug use: No  . Sexual activity: Not Currently    Comment: 1st intercourse- 21, partners- 3,   Other Topics Concern  . Not on file  Social History Narrative   Partner for 2 years in 2013. Prior 20 year relationship-still friends. No children. 1 dog-peekapoo.       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       Hobbies: golf, cooking, gardening, poker   Social Determinants of Health   Financial Resource Strain:   . Difficulty of Paying Living Expenses:   Food Insecurity:   . Worried About Charity fundraiser in the Last Year:   . Arboriculturist in the Last Year:   Transportation Needs:   . Film/video editor (Medical):   Marland Kitchen Lack of Transportation (Non-Medical):   Physical Activity:   . Days of Exercise per Week:   . Minutes of Exercise per Session:   Stress:   . Feeling of Stress :     Social Connections:   . Frequency of Communication with Friends and Family:   . Frequency of Social Gatherings with Friends and Family:   . Attends Religious Services:   . Active Member of Clubs or Organizations:   . Attends Archivist Meetings:   Marland Kitchen Marital Status:     Family History  Problem Relation Age of Onset  . Coronary artery disease Father        age 57 - 25 stents, all 3 siblings with early cardiac disease  . Colon polyps Father   . Heart attack Father 34  . Heart  disease Father   . Cirrhosis Mother        alcohol related  . Alcohol abuse Mother   . Ovarian cancer Maternal Aunt   . Stomach cancer Paternal Aunt   . Esophageal cancer Paternal Aunt   . Diabetes Paternal Grandmother        and aunt  . Colon polyps Sister   . Heart attack Paternal Uncle   . Colon cancer Neg Hx   . Rectal cancer Neg Hx     Review of Systems     Objective:  There were no vitals filed for this visit. BP Readings from Last 3 Encounters:  09/02/19 (!) 141/80  08/12/19 (!) 142/78  05/01/19 120/70   Wt Readings from Last 3 Encounters:  09/02/19 185 lb (83.9 kg)  08/26/19 185 lb (83.9 kg)  08/12/19 183 lb (83 kg)   There is no height or weight on file to calculate BMI.   Physical Exam    Constitutional: Appears well-developed and well-nourished. No distress.  HENT:  Head: Normocephalic and atraumatic.  Neck: Neck supple. No tracheal deviation present. No thyromegaly present.  No cervical lymphadenopathy Cardiovascular: Normal rate, regular rhythm and normal heart sounds.   No murmur heard. No carotid bruit .  No edema Pulmonary/Chest: Effort normal and breath sounds normal. No respiratory distress. No has no wheezes. No rales.  Skin: Skin is warm and dry. Not diaphoretic.  Psychiatric: Normal mood and affect. Behavior is normal.      Assessment & Plan:    See Problem List for Assessment and Plan of chronic medical problems.    This visit occurred during the  SARS-CoV-2 public health emergency.  Safety protocols were in place, including screening questions prior to the visit, additional usage of staff PPE, and extensive cleaning of exam room while observing appropriate contact time as indicated for disinfecting solutions.    This encounter was created in error - please disregard.

## 2020-02-06 NOTE — Patient Instructions (Signed)
  Blood work was ordered.     Medications reviewed and updated.  Changes include :     Your prescription(s) have been submitted to your pharmacy. Please take as directed and contact our office if you believe you are having problem(s) with the medication(s).  A referral was ordered for        Someone will call you to schedule this.    Please followup in 6 months   

## 2020-02-07 ENCOUNTER — Encounter: Payer: 59 | Admitting: Internal Medicine

## 2020-02-11 MED FILL — FLUTICASONE PROP 50 MCG SPR: 50 | 30 days supply | Qty: 16 | Fill #2

## 2020-02-11 MED FILL — ESCITALOPRAM 20 MG TABLET: 20 | 30 days supply | Qty: 30 | Fill #3

## 2020-02-11 MED FILL — GABAPENTIN 300 MG CAPSULE: 300 | 30 days supply | Qty: 30 | Fill #2

## 2020-02-11 NOTE — Progress Notes (Signed)
Subjective:    Patient ID: Margaret Green, female    DOB: 1960-12-06, 59 y.o.   MRN: [id number]  HPI The patient is here for follow up of their chronic medical problems, including htn, hyperlipidemia, hypothyroidism, B12 def, prediabetes, depression, anxiety.  She is taking all of her medications as prescribed.    She is exercising regularly.   She rides a stationary bike.    She tried to taper off the lexapro.  She was very emotional and restarted the lexapro yesterday.   She wants to try something different for weight loss - the phentermine has not worked.    Medications and allergies reviewed with patient and updated if appropriate.  Patient Active Problem List   Diagnosis Date Noted  . Acute sinus infection 12/09/2019  . Lumbosacral radiculopathy at S1 08/12/2019  . Vitamin D deficiency 08/12/2019  . Obesity (BMI 30.0-34.9) 02/05/2019  . Vertigo 07/17/2018  . B12 deficiency 07/02/2018  . Prediabetes 12/23/2017  . Osteopenia 08/10/2017  . Multinodular goiter 03/28/2017  . Chest pain with low risk for cardiac etiology 01/02/2017  . Sjogren's disease ([place]) 08/27/2015  . Depression 07/23/2015  . Adenocarcinoma, colon ([place]) 07/23/2015  . Migraine 08/14/2014  . Allergic rhinitis 08/14/2014  . IBS (irritable bowel syndrome) 08/14/2014  . Essential hypertension, benign 08/14/2014  . GERD (gastroesophageal reflux disease) 08/14/2014  . Right thyroid nodule 09/24/2013  . GAD (generalized anxiety disorder) 09/29/2010  . ANEMIA, PERNICIOUS 02/24/2009  . Hyperlipidemia 04/13/2007  . RAYNAUD'S DISEASE 04/13/2007  . Osteoarthritis 04/13/2007  . Fibromyalgia 04/13/2007  . Hypothyroidism 04/03/2007    Current Outpatient Medications on File Prior to Visit  Medication Sig Dispense Refill  . Calcium Carbonate-Vitamin D (CALCIUM 600+D) 600-400 MG-UNIT tablet Take 1 tablet by mouth daily.    . cevimeline (EVOXAC) 30 MG capsule     . diphenoxylate-atropine (LOMOTIL)  2.5-0.025 MG tablet Take 1 tablet by mouth 4 (four) times daily as needed for diarrhea or loose stools. 30 tablet 0  . DOTTI 0.05 MG/24HR patch PLACE 1 PATCH (0.05 MG TOTAL) ONTO THE SKIN 2 (TWO) TIMES A WEEK. PT PLACES ON WEDNESDAY AND SUNDAY. 24 patch 3  . escitalopram (LEXAPRO) 20 MG tablet TAKE 1 TABLET BY MOUTH ONCE DAILY 30 tablet 5  . fluticasone (FLONASE) 50 MCG/ACT nasal spray Place 2 sprays into both nostrils daily. 16 g 2  . gabapentin (NEURONTIN) 300 MG capsule Take 300 mg by mouth as needed.    . hydroxychloroquine (PLAQUENIL) 200 MG tablet     . levothyroxine (SYNTHROID) 88 MCG tablet TAKE 1 TABLET BY MOUTH DAILY BEFORE BREAKFAST. 90 tablet 3  . losartan (COZAAR) 50 MG tablet TAKE 1 TABLET (50 MG TOTAL) BY MOUTH DAILY. 30 tablet 5  . ondansetron (ZOFRAN ODT) 4 MG disintegrating tablet Take 1 tablet (4 mg total) by mouth every 8 (eight) hours as needed for nausea or vomiting. 20 tablet 0  . rosuvastatin (CRESTOR) 20 MG tablet TAKE 1 TABLET (20 MG TOTAL) BY MOUTH DAILY. 30 tablet 1  . tiZANidine (ZANAFLEX) 4 MG tablet TAKE 1 TABLET BY MOUTH TWICE A DAY AS NEEDED FOR MUSCLE SPASM 60 tablet 0   No current facility-administered medications on file prior to visit.    Past Medical History:  Diagnosis Date  . Adenocarcinoma, colon ([place]) 07/23/2015   Removed completely by polypectomy 06/2015   . Allergy   . Anxiety   . Arthritis   . Endometriosis 08/19/2009   Hysterectomy.     Marland Kitchen  Family history of diabetes mellitus   . GERD (gastroesophageal reflux disease)   . Headache(784.0)   . Heart murmur    past hx of heart murmur  . Hyperlipidemia   . Hypertension   . Hypothyroidism   . Neutropenia   . Raynaud's syndrome   . Sjogrens syndrome ([place])   . Stricture and stenosis of esophagus     Past Surgical History:  Procedure Laterality Date  . ABDOMINAL HYSTERECTOMY    . BREAST BIOPSY     benign  . BREAST REDUCTION SURGERY    . COLONOSCOPY  08/19/2016  . HERNIA REPAIR    .  POLYPECTOMY    . REDUCTION MAMMAPLASTY    . UPPER GASTROINTESTINAL ENDOSCOPY      Social History   Socioeconomic History  . Marital status: Single    Spouse name: Not on file  . Number of children: Not on file  . Years of education: Not on file  . Highest education level: Not on file  Occupational History  . Not on file  Tobacco Use  . Smoking status: Never Smoker  . Smokeless tobacco: Never Used  Substance and Sexual Activity  . Alcohol use: Yes    Alcohol/week: 0.0 standard drinks    Comment: occasionally  . Drug use: No  . Sexual activity: Not Currently    Comment: 1st intercourse- 21, partners- 3,   Other Topics Concern  . Not on file  Social History Narrative   Partner for 2 years in 2013. Prior 20 year relationship-still friends. No children. 1 dog-peekapoo.       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       Hobbies: golf, cooking, gardening, poker   Social Determinants of Health   Financial Resource Strain:   . Difficulty of Paying Living Expenses:   Food Insecurity:   . Worried About Charity fundraiser in the Last Year:   . Arboriculturist in the Last Year:   Transportation Needs:   . Film/video editor (Medical):   Marland Kitchen Lack of Transportation (Non-Medical):   Physical Activity:   . Days of Exercise per Week:   . Minutes of Exercise per Session:   Stress:   . Feeling of Stress :   Social Connections:   . Frequency of Communication with Friends and Family:   . Frequency of Social Gatherings with Friends and Family:   . Attends Religious Services:   . Active Member of Clubs or Organizations:   . Attends Archivist Meetings:   Marland Kitchen Marital Status:     Family History  Problem Relation Age of Onset  . Coronary artery disease Father        age 65 - 15 stents, all 3 siblings with early cardiac disease  . Colon polyps Father   . Heart attack Father 63  . Heart disease Father     . Cirrhosis Mother        alcohol related  . Alcohol abuse Mother   . Ovarian cancer Maternal Aunt   . Stomach cancer Paternal Aunt   . Esophageal cancer Paternal Aunt   . Diabetes Paternal Grandmother        and aunt  . Colon polyps Sister   . Heart attack Paternal Uncle   . Colon cancer Neg Hx   . Rectal cancer Neg Hx     Review of Systems  Constitutional: Negative for chills and fever.  Respiratory: Negative for cough, shortness of breath and wheezing.   Cardiovascular: Negative for chest pain, palpitations and leg swelling.  Neurological: Negative for light-headedness and headaches.       Objective:   Vitals:   02/12/20 1514  BP: 138/80  Pulse: 65  Resp: 16  Temp: 98.4 F (36.9 C)  SpO2: 98%   BP Readings from Last 3 Encounters:  02/12/20 138/80  09/02/19 (!) 141/80  08/12/19 (!) 142/78   Wt Readings from Last 3 Encounters:  02/12/20 186 lb (84.4 kg)  09/02/19 185 lb (83.9 kg)  08/26/19 185 lb (83.9 kg)   Body mass index is 32.18 kg/m.   Physical Exam    Constitutional: Appears well-developed and well-nourished. No distress.  HENT:  Head: Normocephalic and atraumatic.  Neck: Neck supple. No tracheal deviation present. No thyromegaly present.  No cervical lymphadenopathy Cardiovascular: Normal rate, regular rhythm and normal heart sounds.   No murmur heard. No carotid bruit .  No edema Pulmonary/Chest: Effort normal and breath sounds normal. No respiratory distress. No has no wheezes. No rales.  Skin: Skin is warm and dry. Not diaphoretic.  Psychiatric: Normal mood and affect. Behavior is normal.      Assessment & Plan:    See Problem List for Assessment and Plan of chronic medical problems.    This visit occurred during the SARS-CoV-2 public health emergency.  Safety protocols were in place, including screening questions prior to the visit, additional usage of staff PPE, and extensive cleaning of exam room while observing appropriate contact  time as indicated for disinfecting solutions.

## 2020-02-12 ENCOUNTER — Encounter: Payer: Self-pay | Admitting: Internal Medicine

## 2020-02-12 ENCOUNTER — Ambulatory Visit: Payer: 59 | Admitting: Internal Medicine

## 2020-02-12 ENCOUNTER — Other Ambulatory Visit: Payer: Self-pay

## 2020-02-12 ENCOUNTER — Other Ambulatory Visit (HOSPITAL_COMMUNITY): Payer: Self-pay | Admitting: Internal Medicine

## 2020-02-12 VITALS — BP 138/80 | HR 65 | Temp 98.4°F | Resp 16 | Ht 63.75 in | Wt 186.0 lb

## 2020-02-12 DIAGNOSIS — R7303 Prediabetes: Secondary | ICD-10-CM | POA: Diagnosis not present

## 2020-02-12 DIAGNOSIS — E039 Hypothyroidism, unspecified: Secondary | ICD-10-CM

## 2020-02-12 DIAGNOSIS — E7849 Other hyperlipidemia: Secondary | ICD-10-CM

## 2020-02-12 DIAGNOSIS — I10 Essential (primary) hypertension: Secondary | ICD-10-CM | POA: Diagnosis not present

## 2020-02-12 DIAGNOSIS — F3289 Other specified depressive episodes: Secondary | ICD-10-CM

## 2020-02-12 DIAGNOSIS — E559 Vitamin D deficiency, unspecified: Secondary | ICD-10-CM | POA: Diagnosis not present

## 2020-02-12 DIAGNOSIS — E538 Deficiency of other specified B group vitamins: Secondary | ICD-10-CM

## 2020-02-12 DIAGNOSIS — F411 Generalized anxiety disorder: Secondary | ICD-10-CM

## 2020-02-12 DIAGNOSIS — E669 Obesity, unspecified: Secondary | ICD-10-CM

## 2020-02-12 MED ORDER — SAXENDA 18 MG/3ML ~~LOC~~ SOPN: PEN_INJECTOR | SUBCUTANEOUS | 5 refills | Status: DC

## 2020-02-12 NOTE — Assessment & Plan Note (Signed)
Chronic Check lipid panel  Continue daily statin Regular exercise and healthy diet encouraged  

## 2020-02-12 NOTE — Assessment & Plan Note (Signed)
Chronic Taking vitamin d Will check level

## 2020-02-12 NOTE — Assessment & Plan Note (Signed)
Chronic Phentermine has not been effective  - will discontinue Will try saxenda - looks like it is covered by her insurance Continue regular exercise

## 2020-02-12 NOTE — Patient Instructions (Signed)
  Blood work was ordered.     Medications reviewed and updated.  Changes include :   saxenda injection daily  Your prescription(s) have been submitted to your pharmacy. Please take as directed and contact our office if you believe you are having problem(s) with the medication(s).     Please followup in 6 months

## 2020-02-12 NOTE — Assessment & Plan Note (Signed)
Chronic Check a1c Low sugar / carb diet Stressed regular exercise  

## 2020-02-12 NOTE — Assessment & Plan Note (Signed)
Chronic She tried to taper off the lexapro but became very emotional and restarted the medication Continue current dose of medication - lexapro 20 mg daily

## 2020-02-12 NOTE — Assessment & Plan Note (Signed)
Chronic Taking B12 daily Check level 

## 2020-02-12 NOTE — Assessment & Plan Note (Signed)
Managed by Dr Loanne Drilling

## 2020-02-12 NOTE — Assessment & Plan Note (Signed)
Chronic Controlled, stable Continue current dose of medication lexapro 20 mg daily 

## 2020-02-12 NOTE — Assessment & Plan Note (Signed)
Chronic BP well controlled Current regimen effective and well tolerated Continue current medications at current doses cmp  

## 2020-02-13 ENCOUNTER — Other Ambulatory Visit: Payer: Self-pay | Admitting: Internal Medicine

## 2020-02-13 ENCOUNTER — Encounter: Payer: Self-pay | Admitting: Internal Medicine

## 2020-02-13 LAB — CBC WITH DIFFERENTIAL/PLATELET
Basophils Absolute: 0.1 10*3/uL (ref 0.0–0.1)
Basophils Relative: 1 % (ref 0.0–3.0)
Eosinophils Absolute: 0.2 10*3/uL (ref 0.0–0.7)
Eosinophils Relative: 2.7 % (ref 0.0–5.0)
HCT: 37.8 % (ref 36.0–46.0)
Hemoglobin: 12.6 g/dL (ref 12.0–15.0)
Lymphocytes Relative: 38 % (ref 12.0–46.0)
Lymphs Abs: 2.5 10*3/uL (ref 0.7–4.0)
MCHC: 33.4 g/dL (ref 30.0–36.0)
MCV: 92.9 fl (ref 78.0–100.0)
Monocytes Absolute: 0.6 10*3/uL (ref 0.1–1.0)
Monocytes Relative: 9.4 % (ref 3.0–12.0)
Neutro Abs: 3.2 10*3/uL (ref 1.4–7.7)
Neutrophils Relative %: 48.9 % (ref 43.0–77.0)
Platelets: 288 10*3/uL (ref 150.0–400.0)
RBC: 4.07 Mil/uL (ref 3.87–5.11)
RDW: 13.7 % (ref 11.5–15.5)
WBC: 6.5 10*3/uL (ref 4.0–10.5)

## 2020-02-13 LAB — COMPREHENSIVE METABOLIC PANEL
ALT: 13 U/L (ref 0–35)
AST: 16 U/L (ref 0–37)
Albumin: 4.3 g/dL (ref 3.5–5.2)
Alkaline Phosphatase: 56 U/L (ref 39–117)
BUN: 19 mg/dL (ref 6–23)
CO2: 30 mEq/L (ref 19–32)
Calcium: 9.2 mg/dL (ref 8.4–10.5)
Chloride: 102 mEq/L (ref 96–112)
Creatinine, Ser: 0.73 mg/dL (ref 0.40–1.20)
GFR: 81.86 mL/min (ref >60.00)
Glucose, Bld: 85 mg/dL (ref 70–99)
Potassium: 3.9 mEq/L (ref 3.5–5.1)
Sodium: 139 mEq/L (ref 135–145)
Total Bilirubin: 0.3 mg/dL (ref 0.2–1.2)
Total Protein: 7.2 g/dL (ref 6.0–8.3)

## 2020-02-13 LAB — VITAMIN D 25 HYDROXY (VIT D DEFICIENCY, FRACTURES): VITD: 37.17 ng/mL (ref 30.00–100.00)

## 2020-02-13 LAB — LIPID PANEL
Cholesterol: 203 mg/dL — ABNORMAL HIGH (ref 0–200)
HDL: 57.2 mg/dL (ref >39.00)
LDL Cholesterol: 124 mg/dL — ABNORMAL HIGH (ref 0–99)
NonHDL: 145.58
Total CHOL/HDL Ratio: 4
Triglycerides: 110 mg/dL (ref 0.0–149.0)
VLDL: 22 mg/dL (ref 0.0–40.0)

## 2020-02-13 LAB — VITAMIN B12: Vitamin B-12: 395 pg/mL (ref 211–911)

## 2020-02-13 LAB — TSH: TSH: 0.95 u[IU]/mL (ref 0.35–4.50)

## 2020-02-13 LAB — HEMOGLOBIN A1C: Hgb A1c MFr Bld: 5.8 % (ref 4.6–6.5)

## 2020-02-14 MED FILL — tiZANidine HCL 4 MG TABS: 4 | 30 days supply | Qty: 60 | Fill #0

## 2020-02-19 ENCOUNTER — Telehealth: Payer: Self-pay

## 2020-02-19 NOTE — Telephone Encounter (Signed)
Key: [id number]

## 2020-02-26 ENCOUNTER — Other Ambulatory Visit: Payer: Self-pay | Admitting: Internal Medicine

## 2020-02-26 ENCOUNTER — Encounter: Payer: Self-pay | Admitting: Internal Medicine

## 2020-02-26 MED ORDER — CLOTRIMAZOLE 1 % EX CREA: 1.0000 "application " | TOPICAL_CREAM | Freq: Two times a day (BID) | CUTANEOUS | 0 refills | Status: DC

## 2020-02-26 MED ORDER — NYSTATIN 100000 UNIT/GM EX POWD
1.0000 | Freq: Three times a day (TID) | CUTANEOUS | 5 refills | Status: DC
Start: 2020-02-26 — End: 2020-02-26

## 2020-02-26 MED FILL — NYSTATIN 100000 UNIT/GM POW: 100000 | 20 days supply | Qty: 60 | Fill #0

## 2020-02-26 NOTE — Addendum Note (Signed)
Addended by: Binnie Rail on: 02/26/2020 12:51 PM   Modules accepted: Orders

## 2020-03-10 MED FILL — ESCITALOPRAM 20 MG TABLET: 20 | 30 days supply | Qty: 30 | Fill #4

## 2020-03-10 MED FILL — ROSUVASTATIN CALCIUM 20 MG: 20 | 30 days supply | Qty: 30 | Fill #1

## 2020-03-11 MED FILL — HYDROXYCHLOROQUINE SULFATE: 200 | 30 days supply | Qty: 60 | Fill #0

## 2020-03-25 DIAGNOSIS — T148XXA Other injury of unspecified body region, initial encounter: Secondary | ICD-10-CM | POA: Diagnosis not present

## 2020-03-25 DIAGNOSIS — S6982XD Other specified injuries of left wrist, hand and finger(s), subsequent encounter: Secondary | ICD-10-CM | POA: Diagnosis not present

## 2020-03-25 MED FILL — SAXENDA 18 MG/3 ML PEN: 18 | 30 days supply | Qty: 15 | Fill #1

## 2020-03-25 MED FILL — LOSARTAN POTASSIUM 50 MG TA: 50 | 30 days supply | Qty: 30 | Fill #5

## 2020-03-25 MED FILL — MELOXICAM 7.5 MG TABLET: 7.5 | 30 days supply | Qty: 30 | Fill #0

## 2020-03-29 ENCOUNTER — Encounter: Payer: Self-pay | Admitting: Internal Medicine

## 2020-04-07 MED FILL — DOTTI 0.05 MG/24HR PTTW: 0.05 | 84 days supply | Qty: 24 | Fill #1

## 2020-04-07 MED FILL — NYSTATIN 100000 UNIT/GM POW: 100000 | 20 days supply | Qty: 60 | Fill #1

## 2020-04-08 MED FILL — ESCITALOPRAM 20 MG TABLET: 20 | 30 days supply | Qty: 30 | Fill #5

## 2020-04-08 MED FILL — GABAPENTIN 300 MG CAPSULE: 300 | 30 days supply | Qty: 30 | Fill #3

## 2020-04-10 MED FILL — LEVOTHYROXINE 88 MCG TABLET: 88 | 90 days supply | Qty: 90 | Fill #3

## 2020-04-16 MED FILL — SF 5000 PLUS CREAM: 1.1 | 30 days supply | Qty: 51 | Fill #0

## 2020-04-17 ENCOUNTER — Encounter: Payer: Self-pay | Admitting: Internal Medicine

## 2020-04-20 ENCOUNTER — Other Ambulatory Visit: Payer: Self-pay | Admitting: Internal Medicine

## 2020-04-20 MED FILL — CEVIMELINE HCL 30 MG CAPS: 30 | 90 days supply | Qty: 270 | Fill #1

## 2020-04-20 MED FILL — tiZANidine HCL 4 MG TABS: 4 | 30 days supply | Qty: 60 | Fill #0

## 2020-04-20 MED FILL — HYDROXYCHLOROQUINE SULFATE: 200 | 30 days supply | Qty: 60 | Fill #1

## 2020-04-20 MED FILL — ROSUVASTATIN CALCIUM 20 MG: 20 | 30 days supply | Qty: 30 | Fill #0

## 2020-04-20 MED FILL — LOSARTAN POTASSIUM 50 MG TA: 50 | 30 days supply | Qty: 30 | Fill #0

## 2020-04-27 MED FILL — SAXENDA 18 MG/3 ML PEN: 18 | 30 days supply | Qty: 15 | Fill #2

## 2020-05-01 ENCOUNTER — Encounter: Payer: 59 | Admitting: Obstetrics & Gynecology

## 2020-05-04 ENCOUNTER — Other Ambulatory Visit: Payer: Self-pay | Admitting: Internal Medicine

## 2020-05-04 MED FILL — FLUTICASONE PROP 50 MCG SPR: 50 | 30 days supply | Qty: 16 | Fill #0

## 2020-05-08 ENCOUNTER — Other Ambulatory Visit: Payer: Self-pay | Admitting: Internal Medicine

## 2020-05-08 MED FILL — ESCITALOPRAM 20 MG TABLET: 20 | 30 days supply | Qty: 30 | Fill #0

## 2020-05-15 ENCOUNTER — Encounter: Payer: Self-pay | Admitting: Internal Medicine

## 2020-05-15 MED ORDER — VALACYCLOVIR HCL 1 G PO TABS: 1000.0000 mg | ORAL_TABLET | Freq: Three times a day (TID) | ORAL | 0 refills | Status: DC

## 2020-05-15 MED FILL — valACYclovir HCL 1 GM TABS: 1 | 7 days supply | Qty: 21 | Fill #0

## 2020-05-20 MED FILL — LOSARTAN POTASSIUM 50 MG TA: 50 | 30 days supply | Qty: 30 | Fill #1

## 2020-05-22 MED FILL — UNIFINE PENTIPS 6MM 31G: 31G X 6 MM | 90 days supply | Qty: 100 | Fill #1

## 2020-05-29 MED FILL — SAXENDA 18 MG/3 ML PEN: 18 | 30 days supply | Qty: 15 | Fill #3

## 2020-06-09 MED FILL — ESCITALOPRAM 20 MG TABLET: 20 | 30 days supply | Qty: 30 | Fill #1

## 2020-06-09 MED FILL — HYDROXYCHLOROQUINE SULFATE: 200 | 30 days supply | Qty: 60 | Fill #2

## 2020-06-09 MED FILL — ROSUVASTATIN CALCIUM 20 MG: 20 | 30 days supply | Qty: 30 | Fill #1

## 2020-06-10 ENCOUNTER — Other Ambulatory Visit: Payer: Self-pay | Admitting: Family

## 2020-06-13 ENCOUNTER — Other Ambulatory Visit: Payer: Self-pay | Admitting: Internal Medicine

## 2020-06-13 MED FILL — tiZANidine HCL 4 MG TABS: 4 | 30 days supply | Qty: 60 | Fill #0

## 2020-06-17 MED FILL — GABAPENTIN 300 MG CAPSULE: 300 | 30 days supply | Qty: 30 | Fill #4

## 2020-06-19 ENCOUNTER — Telehealth: Payer: Self-pay | Admitting: *Deleted

## 2020-06-19 NOTE — Telephone Encounter (Signed)
Completed PA vis cover-my-meds for Saxenda w/ Key # : [id number]. Rec'd msg [employer] is processing your PA request and will respond shortly with next steps.Marland KitchenJohny Green

## 2020-06-25 MED FILL — GABAPENTIN 300 MG CAPSULE: 300 | 30 days supply | Qty: 30 | Fill #4

## 2020-06-25 MED FILL — tiZANidine HCL 4 MG TABS: 4 | 30 days supply | Qty: 60 | Fill #0

## 2020-06-26 MED FILL — LOSARTAN POTASSIUM 50 MG TA: 50 | 30 days supply | Qty: 30 | Fill #2

## 2020-07-09 ENCOUNTER — Other Ambulatory Visit (HOSPITAL_COMMUNITY): Payer: Self-pay | Admitting: Physician Assistant

## 2020-07-09 DIAGNOSIS — E669 Obesity, unspecified: Secondary | ICD-10-CM | POA: Diagnosis not present

## 2020-07-09 DIAGNOSIS — M3501 Sicca syndrome with keratoconjunctivitis: Secondary | ICD-10-CM | POA: Diagnosis not present

## 2020-07-09 DIAGNOSIS — K029 Dental caries, unspecified: Secondary | ICD-10-CM | POA: Diagnosis not present

## 2020-07-09 DIAGNOSIS — Z6831 Body mass index (BMI) 31.0-31.9, adult: Secondary | ICD-10-CM | POA: Diagnosis not present

## 2020-07-09 DIAGNOSIS — D72820 Lymphocytosis (symptomatic): Secondary | ICD-10-CM | POA: Diagnosis not present

## 2020-07-09 DIAGNOSIS — M797 Fibromyalgia: Secondary | ICD-10-CM | POA: Diagnosis not present

## 2020-07-09 DIAGNOSIS — E041 Nontoxic single thyroid nodule: Secondary | ICD-10-CM | POA: Diagnosis not present

## 2020-07-09 DIAGNOSIS — I7300 Raynaud's syndrome without gangrene: Secondary | ICD-10-CM | POA: Diagnosis not present

## 2020-07-09 MED FILL — CEVIMELINE HCL 30 MG CAPS: 30 | 90 days supply | Qty: 270 | Fill #0

## 2020-07-09 MED FILL — HYDROXYCHLOROQUINE SULFATE: 200 | 90 days supply | Qty: 180 | Fill #0

## 2020-07-15 MED FILL — NYSTATIN 100,000 UNIT/GM PO: 100000 | 20 days supply | Qty: 60 | Fill #0

## 2020-07-16 ENCOUNTER — Other Ambulatory Visit: Payer: Self-pay | Admitting: Endocrinology

## 2020-07-16 MED FILL — ESCITALOPRAM 20 MG TABLET: 20 | 30 days supply | Qty: 30 | Fill #2

## 2020-07-16 MED FILL — LEVOTHYROXINE 88 MCG TABLET: 88 | 90 days supply | Qty: 90 | Fill #0

## 2020-07-21 ENCOUNTER — Telehealth: Payer: Self-pay | Admitting: Internal Medicine

## 2020-07-21 NOTE — Telephone Encounter (Signed)
Liraglutide -Weight Management (SAXENDA) 18 MG/3ML SOPN [employer]eds calling for a prior authorization for this medication  Reference # [facility]

## 2020-07-23 NOTE — Telephone Encounter (Signed)
Form completed and faxed to [facility] today for approval along with clinical notes.

## 2020-07-24 MED FILL — LOSARTAN POTASSIUM 50 MG TA: 50 | 30 days supply | Qty: 30 | Fill #3

## 2020-07-24 MED FILL — GABAPENTIN 300 MG CAPSULE: 300 | 30 days supply | Qty: 30 | Fill #5

## 2020-07-30 MED FILL — SAXENDA 18 MG/3 ML PEN: 18 | 30 days supply | Qty: 15 | Fill #4

## 2020-08-10 ENCOUNTER — Other Ambulatory Visit: Payer: Self-pay | Admitting: Internal Medicine

## 2020-08-10 MED FILL — ROSUVASTATIN CALCIUM 20 MG: 20 | 30 days supply | Qty: 30 | Fill #0

## 2020-08-10 NOTE — Patient Instructions (Addendum)
Blood work was ordered.     Pneumovax immunization administered today.   Medications changes include : wegovy     Your prescription(s) have been submitted to your pharmacy.      Please followup in 6 months    Health Maintenance, Female Adopting a healthy lifestyle and getting preventive care are important in promoting health and wellness. Ask your health care provider about:  The right schedule for you to have regular tests and exams.  Things you can do on your own to prevent diseases and keep yourself healthy. What should I know about diet, weight, and exercise? Eat a healthy diet   Eat a diet that includes plenty of vegetables, fruits, low-fat dairy products, and lean protein.  Do not eat a lot of foods that are high in solid fats, added sugars, or sodium. Maintain a healthy weight Body mass index (BMI) is used to identify weight problems. It estimates body fat based on height and weight. Your health care provider can help determine your BMI and help you achieve or maintain a healthy weight. Get regular exercise Get regular exercise. This is one of the most important things you can do for your health. Most adults should:  Exercise for at least 150 minutes each week. The exercise should increase your heart rate and make you sweat (moderate-intensity exercise).  Do strengthening exercises at least twice a week. This is in addition to the moderate-intensity exercise.  Spend less time sitting. Even light physical activity can be beneficial. Watch cholesterol and blood lipids Have your blood tested for lipids and cholesterol at 58 years of age, then have this test every 5 years. Have your cholesterol levels checked more often if:  Your lipid or cholesterol levels are high.  You are older than 58 years of age.  You are at high risk for heart disease. What should I know about cancer screening? Depending on your health history and family history, you may need to have  cancer screening at various ages. This may include screening for:  Breast cancer.  Cervical cancer.  Colorectal cancer.  Skin cancer.  Lung cancer. What should I know about heart disease, diabetes, and high blood pressure? Blood pressure and heart disease  High blood pressure causes heart disease and increases the risk of stroke. This is more likely to develop in people who have high blood pressure readings, are of African descent, or are overweight.  Have your blood pressure checked: ? Every 3-5 years if you are 26-23 years of age. ? Every year if you are 40 years old or older. Diabetes Have regular diabetes screenings. This checks your fasting blood sugar level. Have the screening done:  Once every three years after age 33 if you are at a normal weight and have a low risk for diabetes.  More often and at a younger age if you are overweight or have a high risk for diabetes. What should I know about preventing infection? Hepatitis B If you have a higher risk for hepatitis B, you should be screened for this virus. Talk with your health care provider to find out if you are at risk for hepatitis B infection. Hepatitis C Testing is recommended for:  Everyone born from [birth year] through 1964.  Anyone with known risk factors for hepatitis C. Sexually transmitted infections (STIs)  Get screened for STIs, including gonorrhea and chlamydia, if: ? You are sexually active and are younger than 58 years of age. ? You are older than 58 years of age  and your health care provider tells you that you are at risk for this type of infection. ? Your sexual activity has changed since you were last screened, and you are at increased risk for chlamydia or gonorrhea. Ask your health care provider if you are at risk.  Ask your health care provider about whether you are at high risk for HIV. Your health care provider may recommend a prescription medicine to help prevent HIV infection. If you choose to take  medicine to prevent HIV, you should first get tested for HIV. You should then be tested every 3 months for as long as you are taking the medicine. Pregnancy  If you are about to stop having your period (premenopausal) and you may become pregnant, seek counseling before you get pregnant.  Take 400 to 800 micrograms (mcg) of folic acid every day if you become pregnant.  Ask for birth control (contraception) if you want to prevent pregnancy. Osteoporosis and menopause Osteoporosis is a disease in which the bones lose minerals and strength with aging. This can result in bone fractures. If you are 39 years old or older, or if you are at risk for osteoporosis and fractures, ask your health care provider if you should:  Be screened for bone loss.  Take a calcium or vitamin D supplement to lower your risk of fractures.  Be given hormone replacement therapy (HRT) to treat symptoms of menopause. Follow these instructions at home: Lifestyle  Do not use any products that contain nicotine or tobacco, such as cigarettes, e-cigarettes, and chewing tobacco. If you need help quitting, ask your health care provider.  Do not use street drugs.  Do not share needles.  Ask your health care provider for help if you need support or information about quitting drugs. Alcohol use  Do not drink alcohol if: ? Your health care provider tells you not to drink. ? You are pregnant, may be pregnant, or are planning to become pregnant.  If you drink alcohol: ? Limit how much you use to 0-1 drink a day. ? Limit intake if you are breastfeeding.  Be aware of how much alcohol is in your drink. In the [place] one drink equals one 12 oz bottle of beer (355 mL), one 5 oz glass of wine (148 mL), or one 1 oz glass of hard liquor (44 mL). General instructions  Schedule regular health, dental, and eye exams.  Stay current with your vaccines.  Tell your health care provider if: ? You often feel depressed. ? You have  ever been abused or do not feel safe at home. Summary  Adopting a healthy lifestyle and getting preventive care are important in promoting health and wellness.  Follow your health care provider's instructions about healthy diet, exercising, and getting tested or screened for diseases.  Follow your health care provider's instructions on monitoring your cholesterol and blood pressure. This information is not intended to replace advice given to you by your health care provider. Make sure you discuss any questions you have with your health care provider. Document Revised: 08/22/2018 Document Reviewed: 08/22/2018 Elsevier Patient Education  2020 [employer].

## 2020-08-10 NOTE — Progress Notes (Signed)
Subjective:    Patient ID: Margaret Green, female    DOB: [birth year], 59 y.o.   MRN: [id number]   This visit occurred during the SARS-CoV-2 public health emergency.  Safety protocols were in place, including screening questions prior to the visit, additional usage of staff PPE, and extensive cleaning of exam room while observing appropriate contact time as indicated for disinfecting solutions.    HPI She is here for a physical exam.   Change in bowel movements-increased frequency-started about 3 weeks ago-she is having bowel movements 4-6 times a day ( usual 1-2) very soft - like mud.  occ leaks out on its own.  She has been eating a lot of apples and oranges and wondered if that was the cause.  She is eating 2-3 apples and 1-3 oranges a day.  She is having a lot of gas.  She did make an appt with GI for next week. No new meds/supplements.  Started tumeric x 4 months, but does not think that is the cause.   She denies blood in the stool and abdominal pain.       Difficulty losing weight - breakfast low fat yogurt with berries.  Microwave cup of rice, peas,  Lettuce with low skim mozzarella cheese and little salad dressing.  Dinner -soup or cereal, egg/toast.  She lost 7 lbs on saxenda.  She is riding her bike most days of the week.  She is active at work.  She is wondering what else she can do.    Medications and allergies reviewed with patient and updated if appropriate.  Patient Active Problem List   Diagnosis Date Noted  . Lumbosacral radiculopathy at S1 08/12/2019  . Vitamin D deficiency 08/12/2019  . Obesity (BMI 30.0-34.9) 02/05/2019  . Vertigo 07/17/2018  . B12 deficiency 07/02/2018  . Prediabetes 12/23/2017  . Osteopenia 08/10/2017  . Multinodular goiter 03/28/2017  . Chest pain with low risk for cardiac etiology 01/02/2017  . Sjogren's disease ([place]) 08/27/2015  . Depression 07/23/2015  . History of colon cancer, stage I 07/23/2015  . Migraine 08/14/2014  .  Allergic rhinitis 08/14/2014  . IBS (irritable bowel syndrome) 08/14/2014  . Essential hypertension, benign 08/14/2014  . GERD (gastroesophageal reflux disease) 08/14/2014  . GAD (generalized anxiety disorder) 09/29/2010  . ANEMIA, PERNICIOUS 02/24/2009  . Hyperlipidemia 04/13/2007  . RAYNAUD'S DISEASE 04/13/2007  . Osteoarthritis 04/13/2007  . Fibromyalgia 04/13/2007  . Hypothyroidism 04/03/2007    Current Outpatient Medications on File Prior to Visit  Medication Sig Dispense Refill  . Calcium Carbonate-Vitamin D (CALCIUM 600+D) 600-400 MG-UNIT tablet Take 1 tablet by mouth daily.    . cevimeline (EVOXAC) 30 MG capsule     . clotrimazole (CLOTRIMAZOLE ANTI-FUNGAL) 1 % cream Apply 1 application topically 2 (two) times daily. 30 g 0  . diphenoxylate-atropine (LOMOTIL) 2.5-0.025 MG tablet Take 1 tablet by mouth 4 (four) times daily as needed for diarrhea or loose stools. 30 tablet 0  . DOTTI 0.05 MG/24HR patch PLACE 1 PATCH (0.05 MG TOTAL) ONTO THE SKIN 2 (TWO) TIMES A WEEK. PT PLACES ON WEDNESDAY AND SUNDAY. 24 patch 3  . escitalopram (LEXAPRO) 20 MG tablet TAKE 1 TABLET BY MOUTH ONCE DAILY 30 tablet 5  . fluticasone (FLONASE) 50 MCG/ACT nasal spray IMSTILL 2 SPRAYS INTO BOTH NOSTRILS DAILY. 16 g 2  . gabapentin (NEURONTIN) 300 MG capsule Take 300 mg by mouth as needed.    . hydroxychloroquine (PLAQUENIL) 200 MG tablet     . levothyroxine (  SYNTHROID) 75 MCG tablet 88 mcg.    . levothyroxine (SYNTHROID) 88 MCG tablet TAKE 1 TABLET BY MOUTH DAILY BEFORE BREAKFAST. 90 tablet 3  . Liraglutide -Weight Management (SAXENDA) 18 MG/3ML SOPN 0.6 mg daily x 1 week, then increase by 0.6 mg weekly for max dose of 3 mg daily 3 mL 5  . losartan (COZAAR) 50 MG tablet TAKE 1 TABLET (50 MG TOTAL) BY MOUTH DAILY. 30 tablet 5  . nystatin (MYCOSTATIN/NYSTOP) powder Apply 1 application topically 3 (three) times daily. 60 g 5  . rosuvastatin (CRESTOR) 20 MG tablet TAKE 1 TABLET (20 MG TOTAL) BY MOUTH DAILY. 30  tablet 1  . tiZANidine (ZANAFLEX) 4 MG tablet TAKE 1 TABLET BY MOUTH TWICE A DAY AS NEEDED FOR MUSCLE SPASM 60 tablet 0  . SF 5000 PLUS 1.1 % CREA dental cream Take by mouth.    Marland Kitchen UNIFINE PENTIPS 31G X 6 MM MISC USE AS DIRECTED WITH SAXENDA     No current facility-administered medications on file prior to visit.    Past Medical History:  Diagnosis Date  . Adenocarcinoma, colon ([place]) 07/23/2015   Removed completely by polypectomy 06/2015   . Allergy   . Anxiety   . Arthritis   . Endometriosis 08/19/2009   Hysterectomy.     . Family history of diabetes mellitus   . GERD (gastroesophageal reflux disease)   . Headache(784.0)   . Heart murmur    past hx of heart murmur  . Hyperlipidemia   . Hypertension   . Hypothyroidism   . Neutropenia   . Raynaud's syndrome   . Sjogrens syndrome ([place])   . Stricture and stenosis of esophagus     Past Surgical History:  Procedure Laterality Date  . ABDOMINAL HYSTERECTOMY    . BREAST BIOPSY     benign  . BREAST REDUCTION SURGERY    . COLONOSCOPY  08/19/2016  . HERNIA REPAIR    . POLYPECTOMY    . REDUCTION MAMMAPLASTY    . UPPER GASTROINTESTINAL ENDOSCOPY      Social History   Socioeconomic History  . Marital status: Single    Spouse name: Not on file  . Number of children: Not on file  . Years of education: Not on file  . Highest education level: Not on file  Occupational History  . Not on file  Tobacco Use  . Smoking status: Never Smoker  . Smokeless tobacco: Never Used  Vaping Use  . Vaping Use: Never used  Substance and Sexual Activity  . Alcohol use: Yes    Alcohol/week: 0.0 standard drinks    Comment: occasionally  . Drug use: No  . Sexual activity: Not Currently    Comment: 1st intercourse- 21, partners- 3,   Other Topics Concern  . Not on file  Social History Narrative   Partner for 2 years in 2013. Prior 20 year relationship-still friends. No children. 1 dog-peekapoo.       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       Hobbies: golf, cooking, gardening, poker   Social Determinants of Health   Financial Resource Strain:   . Difficulty of Paying Living Expenses: Not on file  Food Insecurity:   . Worried About Charity fundraiser in the Last Year: Not on file  . Ran Out of Food in the Last Year: Not on file  Transportation Needs:   . Lack of Transportation (Medical): Not on file  .  Lack of Transportation (Non-Medical): Not on file  Physical Activity:   . Days of Exercise per Week: Not on file  . Minutes of Exercise per Session: Not on file  Stress:   . Feeling of Stress : Not on file  Social Connections:   . Frequency of Communication with Friends and Family: Not on file  . Frequency of Social Gatherings with Friends and Family: Not on file  . Attends Religious Services: Not on file  . Active Member of Clubs or Organizations: Not on file  . Attends Archivist Meetings: Not on file  . Marital Status: Not on file    Family History  Problem Relation Age of Onset  . Coronary artery disease Father        age 10 - 57 stents, all 3 siblings with early cardiac disease  . Colon polyps Father   . Heart attack Father 89  . Heart disease Father   . Cirrhosis Mother        alcohol related  . Alcohol abuse Mother   . Ovarian cancer Maternal Aunt   . Stomach cancer Paternal Aunt   . Esophageal cancer Paternal Aunt   . Diabetes Paternal Grandmother        and aunt  . Colon polyps Sister   . Heart attack Paternal Uncle   . Colon cancer Neg Hx   . Rectal cancer Neg Hx     Review of Systems  Constitutional: Negative for appetite change, fatigue and fever.  Eyes: Negative for visual disturbance.  Respiratory: Positive for shortness of breath (with exertion) and wheezing (mild with walking at imes). Negative for cough and chest tightness.   Cardiovascular: Negative for chest pain, palpitations and leg swelling.    Gastrointestinal: Negative for abdominal pain, blood in stool, constipation and diarrhea.       More frequent stools  Mud like consistency  Genitourinary: Negative for dysuria and hematuria.  Musculoskeletal: Positive for arthralgias (controlled). Negative for back pain.  Skin: Positive for color change (new moles). Negative for rash.  Neurological: Negative for light-headedness and headaches.  Psychiatric/Behavioral: Positive for dysphoric mood and sleep disturbance (wakes a couple of times). The patient is nervous/anxious.        Objective:   Vitals:   08/11/20 1526  BP: 130/78  Pulse: 86  Temp: 98.3 F (36.8 C)  SpO2: 97%   Filed Weights   08/11/20 1526  Weight: 187 lb (84.8 kg)   Body mass index is 32.35 kg/m.  BP Readings from Last 3 Encounters:  08/11/20 130/78  02/12/20 138/80  09/02/19 (!) 141/80    Wt Readings from Last 3 Encounters:  08/11/20 187 lb (84.8 kg)  02/12/20 186 lb (84.4 kg)  09/02/19 185 lb (83.9 kg)     Physical Exam Constitutional: She appears well-developed and well-nourished. No distress.  HENT:  Head: Normocephalic and atraumatic.  Right Ear: External ear normal. Normal ear canal and TM Left Ear: External ear normal.  Normal ear canal and TM Mouth/Throat: Oropharynx is clear and moist.  Eyes: Conjunctivae and EOM are normal.  Neck: Neck supple. No tracheal deviation present. No thyromegaly present.  No carotid bruit  Cardiovascular: Normal rate, regular rhythm and normal heart sounds.   No murmur heard.  No edema. Pulmonary/Chest: Effort normal and breath sounds normal. No respiratory distress. She has no wheezes. She has no rales.  Breast: deferred   Abdominal: Soft. She exhibits no distension. There is no tenderness.  Lymphadenopathy: She has no  cervical adenopathy.  Skin: Skin is warm and dry. She is not diaphoretic.  Psychiatric: She has a normal mood and affect. Her behavior is normal.        Assessment & Plan:    Physical exam: Screening blood work    ordered Immunizations  Had covid and flu; discussed shingrix, pneumnovax today. Colonoscopy  Up to date  Mammogram  Up to date  Gyn  N/a-status post hysterectomy DEXA-due Eye exams  Up to date Exercise  Riding bike most days Weight  Advised weight loss Substance abuse   none      See Problem List for Assessment and Plan of chronic medical problems.

## 2020-08-11 ENCOUNTER — Encounter: Payer: Self-pay | Admitting: Internal Medicine

## 2020-08-11 ENCOUNTER — Ambulatory Visit (INDEPENDENT_AMBULATORY_CARE_PROVIDER_SITE_OTHER): Payer: 59 | Admitting: Internal Medicine

## 2020-08-11 ENCOUNTER — Other Ambulatory Visit: Payer: Self-pay

## 2020-08-11 VITALS — BP 130/78 | HR 86 | Temp 98.3°F | Ht 63.75 in | Wt 187.0 lb

## 2020-08-11 DIAGNOSIS — M8589 Other specified disorders of bone density and structure, multiple sites: Secondary | ICD-10-CM | POA: Diagnosis not present

## 2020-08-11 DIAGNOSIS — E7849 Other hyperlipidemia: Secondary | ICD-10-CM

## 2020-08-11 DIAGNOSIS — F3289 Other specified depressive episodes: Secondary | ICD-10-CM

## 2020-08-11 DIAGNOSIS — Z23 Encounter for immunization: Secondary | ICD-10-CM

## 2020-08-11 DIAGNOSIS — E039 Hypothyroidism, unspecified: Secondary | ICD-10-CM | POA: Diagnosis not present

## 2020-08-11 DIAGNOSIS — I10 Essential (primary) hypertension: Secondary | ICD-10-CM

## 2020-08-11 DIAGNOSIS — E669 Obesity, unspecified: Secondary | ICD-10-CM | POA: Diagnosis not present

## 2020-08-11 DIAGNOSIS — E559 Vitamin D deficiency, unspecified: Secondary | ICD-10-CM | POA: Diagnosis not present

## 2020-08-11 DIAGNOSIS — R7303 Prediabetes: Secondary | ICD-10-CM | POA: Diagnosis not present

## 2020-08-11 DIAGNOSIS — Z0000 Encounter for general adult medical examination without abnormal findings: Secondary | ICD-10-CM

## 2020-08-11 DIAGNOSIS — E538 Deficiency of other specified B group vitamins: Secondary | ICD-10-CM | POA: Diagnosis not present

## 2020-08-11 DIAGNOSIS — F411 Generalized anxiety disorder: Secondary | ICD-10-CM

## 2020-08-11 DIAGNOSIS — M3500 Sicca syndrome, unspecified: Secondary | ICD-10-CM

## 2020-08-11 DIAGNOSIS — Z85038 Personal history of other malignant neoplasm of large intestine: Secondary | ICD-10-CM

## 2020-08-11 MED ORDER — SEMAGLUTIDE-WEIGHT MANAGEMENT 0.25 MG/0.5ML ~~LOC~~ SOAJ: 0.2500 mg | SUBCUTANEOUS | 0 refills | Status: DC

## 2020-08-11 NOTE — Assessment & Plan Note (Signed)
History of colon cancer Colonoscopy is up-to-date Has had some change in bowel habits-has appointment to see GI next week Will stop apples and oranges to see if her bowel movements return to normal

## 2020-08-11 NOTE — Assessment & Plan Note (Signed)
Chronic Check a1c Low sugar / carb diet Stressed regular exercise  

## 2020-08-11 NOTE — Assessment & Plan Note (Signed)
Chronic BP well controlled Continue losartan 50 mg daily cmp  

## 2020-08-11 NOTE — Assessment & Plan Note (Signed)
Chronic She did have some success with Saxenda which we can try again, but she was hoping to have more success We will try will go away if she can get a savings coupon Continue regular exercise.  She is also very active at work She is eating healthy overall-advised that she start doing calorie counting because she is likely consuming more calories than she realizes-goal 1200 cal a day

## 2020-08-11 NOTE — Assessment & Plan Note (Signed)
Chronic  Clinically euthyroid  managed by Dr. Loanne Drilling

## 2020-08-11 NOTE — Assessment & Plan Note (Signed)
Chronic Following with rheumatology On hydroxychloroquine, cevimeline Symptoms controlled

## 2020-08-11 NOTE — Assessment & Plan Note (Signed)
Chronic taking vitamin B12 daily We will check vitamin B12 level

## 2020-08-11 NOTE — Assessment & Plan Note (Signed)
Chronic Taking vitamin D daily Check vitamin D level  

## 2020-08-11 NOTE — Assessment & Plan Note (Signed)
Chronic Controlled, stable Continue Lexapro 20 mg daily

## 2020-08-11 NOTE — Assessment & Plan Note (Signed)
Chronic DEXA due-ordered She is exercising and is active throughout the day Taking calcium and vitamin D-vitamin D level was low-we will check

## 2020-08-11 NOTE — Assessment & Plan Note (Signed)
Chronic Check lipid panel  Continue Crestor 20 mg daily Regular exercise and healthy diet encouraged  

## 2020-08-12 ENCOUNTER — Encounter: Payer: Self-pay | Admitting: Internal Medicine

## 2020-08-12 LAB — COMPREHENSIVE METABOLIC PANEL
ALT: 16 U/L (ref 0–35)
AST: 19 U/L (ref 0–37)
Albumin: 4.2 g/dL (ref 3.5–5.2)
Alkaline Phosphatase: 56 U/L (ref 39–117)
BUN: 13 mg/dL (ref 6–23)
CO2: 30 mEq/L (ref 19–32)
Calcium: 9.9 mg/dL (ref 8.4–10.5)
Chloride: 102 mEq/L (ref 96–112)
Creatinine, Ser: 0.8 mg/dL (ref 0.40–1.20)
GFR: 81.12 mL/min (ref >60.00)
Glucose, Bld: 82 mg/dL (ref 70–99)
Potassium: 4.4 mEq/L (ref 3.5–5.1)
Sodium: 138 mEq/L (ref 135–145)
Total Bilirubin: 0.3 mg/dL (ref 0.2–1.2)
Total Protein: 7.4 g/dL (ref 6.0–8.3)

## 2020-08-12 LAB — VITAMIN D 25 HYDROXY (VIT D DEFICIENCY, FRACTURES): VITD: 42.13 ng/mL (ref 30.00–100.00)

## 2020-08-12 LAB — CBC WITH DIFFERENTIAL/PLATELET
Basophils Absolute: 0.1 10*3/uL (ref 0.0–0.1)
Basophils Relative: 1.2 % (ref 0.0–3.0)
Eosinophils Absolute: 0.1 10*3/uL (ref 0.0–0.7)
Eosinophils Relative: 2.3 % (ref 0.0–5.0)
HCT: 39.1 % (ref 36.0–46.0)
Hemoglobin: 13 g/dL (ref 12.0–15.0)
Lymphocytes Relative: 40.2 % (ref 12.0–46.0)
Lymphs Abs: 2.1 10*3/uL (ref 0.7–4.0)
MCHC: 33.3 g/dL (ref 30.0–36.0)
MCV: 92.8 fl (ref 78.0–100.0)
Monocytes Absolute: 0.5 10*3/uL (ref 0.1–1.0)
Monocytes Relative: 8.8 % (ref 3.0–12.0)
Neutro Abs: 2.5 10*3/uL (ref 1.4–7.7)
Neutrophils Relative %: 47.5 % (ref 43.0–77.0)
Platelets: 297 10*3/uL (ref 150.0–400.0)
RBC: 4.21 Mil/uL (ref 3.87–5.11)
RDW: 13.7 % (ref 11.5–15.5)
WBC: 5.2 10*3/uL (ref 4.0–10.5)

## 2020-08-12 LAB — LIPID PANEL
Cholesterol: 187 mg/dL (ref 0–200)
HDL: 64.2 mg/dL (ref >39.00)
LDL Cholesterol: 103 mg/dL — ABNORMAL HIGH (ref 0–99)
NonHDL: 122.32
Total CHOL/HDL Ratio: 3
Triglycerides: 99 mg/dL (ref 0.0–149.0)
VLDL: 19.8 mg/dL (ref 0.0–40.0)

## 2020-08-12 LAB — TSH: TSH: 1.58 u[IU]/mL (ref 0.35–4.50)

## 2020-08-12 LAB — HEMOGLOBIN A1C: Hgb A1c MFr Bld: 5.5 % (ref 4.6–6.5)

## 2020-08-12 LAB — VITAMIN B12: Vitamin B-12: 421 pg/mL (ref 211–911)

## 2020-08-12 NOTE — Addendum Note (Signed)
Addended by: Marcina Millard on: 08/12/2020 08:42 AM   Modules accepted: Orders

## 2020-08-13 ENCOUNTER — Other Ambulatory Visit: Payer: Self-pay | Admitting: Internal Medicine

## 2020-08-13 MED ORDER — SAXENDA 18 MG/3ML ~~LOC~~ SOPN
PEN_INJECTOR | SUBCUTANEOUS | 5 refills | Status: DC
Start: 2020-08-13 — End: 2020-08-14

## 2020-08-14 ENCOUNTER — Other Ambulatory Visit: Payer: Self-pay | Admitting: Internal Medicine

## 2020-08-14 ENCOUNTER — Telehealth: Payer: Self-pay | Admitting: Internal Medicine

## 2020-08-14 MED ORDER — SAXENDA 18 MG/3ML ~~LOC~~ SOPN
PEN_INJECTOR | SUBCUTANEOUS | 5 refills | Status: DC
Start: 2020-08-14 — End: 2020-08-14

## 2020-08-14 NOTE — Telephone Encounter (Signed)
error 

## 2020-08-14 NOTE — Telephone Encounter (Signed)
Margaret Green from [facility] called regarding   Liraglutide -Weight Management (SAXENDA) 18 MG/3ML SOPN  Needing to adjust the order for a 5 pack instead for a 30 day supply. The medication comes pre-packaged for 5.   Pharmacy:  [facility], [place] - [address]. Phone:  [phone]  Fax:  [phone]

## 2020-08-19 ENCOUNTER — Ambulatory Visit: Payer: 59 | Admitting: Nurse Practitioner

## 2020-08-20 MED FILL — FLUTICASONE PROP 50 MCG SPR: 50 | 30 days supply | Qty: 16 | Fill #1

## 2020-08-20 MED FILL — ESCITALOPRAM 20 MG TABLET: 20 | 30 days supply | Qty: 30 | Fill #3

## 2020-08-24 MED FILL — SAXENDA 18 MG/3 ML PEN: 18 | 30 days supply | Qty: 15 | Fill #0

## 2020-08-25 ENCOUNTER — Other Ambulatory Visit (HOSPITAL_COMMUNITY): Payer: Self-pay | Admitting: Physician Assistant

## 2020-08-25 MED FILL — GABAPENTIN 300 MG CAPSULE: 300 | 30 days supply | Qty: 30 | Fill #0

## 2020-08-26 MED FILL — UNIFINE PENTIPS 6MM 31G: 31G X 6 MM | 90 days supply | Qty: 100 | Fill #2

## 2020-08-28 MED FILL — LOSARTAN POTASSIUM 50 MG TA: 50 | 30 days supply | Qty: 30 | Fill #4

## 2020-09-03 ENCOUNTER — Encounter: Payer: Self-pay | Admitting: Internal Medicine

## 2020-09-03 ENCOUNTER — Other Ambulatory Visit: Payer: Self-pay | Admitting: Internal Medicine

## 2020-09-03 MED ORDER — DIPHENOXYLATE-ATROPINE 2.5-0.025 MG PO TABS: 1.0000 | ORAL_TABLET | Freq: Four times a day (QID) | ORAL | 0 refills | Status: DC | PRN

## 2020-09-03 MED FILL — DIPHENOXYLATE-ATROPINE 2.5-: 2.5-0.025 | 8 days supply | Qty: 30 | Fill #0

## 2020-09-13 ENCOUNTER — Other Ambulatory Visit: Payer: Self-pay | Admitting: Family

## 2020-09-14 ENCOUNTER — Other Ambulatory Visit: Payer: Self-pay | Admitting: Internal Medicine

## 2020-09-14 MED FILL — tiZANidine HCL 4 MG TABS: 4 | 30 days supply | Qty: 60 | Fill #0

## 2020-09-21 ENCOUNTER — Encounter: Payer: Self-pay | Admitting: Internal Medicine

## 2020-09-24 ENCOUNTER — Ambulatory Visit: Payer: 59 | Admitting: Physician Assistant

## 2020-09-24 ENCOUNTER — Encounter: Payer: Self-pay | Admitting: Physician Assistant

## 2020-09-24 ENCOUNTER — Ambulatory Visit (INDEPENDENT_AMBULATORY_CARE_PROVIDER_SITE_OTHER)
Admission: RE | Admit: 2020-09-24 | Discharge: 2020-09-24 | Disposition: A | Discharge status: Home / Self Care | Payer: 59 | Source: Ambulatory Visit | Attending: Physician Assistant | Admitting: Physician Assistant

## 2020-09-24 ENCOUNTER — Other Ambulatory Visit: Payer: 59

## 2020-09-24 ENCOUNTER — Other Ambulatory Visit: Payer: Self-pay

## 2020-09-24 VITALS — BP 110/68 | HR 67 | Ht 63.75 in | Wt 188.6 lb

## 2020-09-24 DIAGNOSIS — R140 Abdominal distension (gaseous): Secondary | ICD-10-CM

## 2020-09-24 DIAGNOSIS — R194 Change in bowel habit: Secondary | ICD-10-CM

## 2020-09-24 NOTE — Patient Instructions (Addendum)
LABS: Your provider has requested that you go to the basement level for lab work before leaving today. Press "B" on the elevator. The lab is located at the first door on the left as you exit the elevator.  HEALTHCARE LAWS AND MY CHART RESULTS: Due to recent changes in healthcare laws, you may see the results of your imaging and laboratory studies on MyChart before your provider has had a chance to review them.  We understand that in some cases there may be results that are confusing or concerning to you. Not all laboratory results come back in the same time frame and the provider may be waiting for multiple results in order to interpret others.  Please give [place] 48 hours in order for your provider to thoroughly review all the results before contacting the office for clarification of your results.   Your provider has requested that you have an abdominal x ray before leaving today. Please go to the basement floor to our Radiology department for the test.  If you are age 59 or younger, your body mass index should be between 19-25. Your Body mass index is 32.63 kg/m. If this is out of the aformentioned range listed, please consider follow up with your Primary Care Provider.   Thank you for trusting me with your gastrointestinal care!    Ellouise Newer, [place]

## 2020-09-24 NOTE — Progress Notes (Signed)
Agree with assessment and plan as outlined. She may want to try fiber supplement like Citrucel to provide some regularity / form in the interim.

## 2020-09-24 NOTE — Progress Notes (Signed)
Chief Complaint: Change in bowel habits  HPI:    [name] is a 60 year old Caucasian female with a past medical history as listed below including adenocarcinoma of the colon removed completely by polypectomy 06/2015, known to Dr. Havery Green, who presents to clinic today with a complaint of change in bowel habits.    09/02/2019 colonoscopy with a 3 mm polyp in the descending colon, few small mouth diverticula in the sigmoid colon otherwise normal.  Pathology showed adenomatous polyp.  Repeat recommended in 5 years.    08/11/2020 patient saw PCP and described a change in bowel movements with increased frequency which started 3 weeks prior to that, described having bowel movements 4-6 times a day very soft-like mild with occasional leaking out on its own.  Apparently she been eating a lot of apples and oranges at that time wondered if it was the cause.  Also a lot of gas.  She has started turmeric 4 months prior.    Today, the patient presents to clinic and tells me that for the past month and a half she has had a change in bowel habits noting that they are not normal and solid about 80 to 90% of the time.  Describes that they look like "mud", they are typically very urgent and she has occasional "leaking".  Also describes "a lot of gas".  Tells me she started Gas-X recently which maybe helps a little bit.  Also occasional right lower quadrant tenderness which she describes as a "muscle soreness".  This is unrelated to bowel movements.  Tells me that she only has 1-2 bowel movements a day.  Was initially eating a lot of excessive fruit, but stopped doing this and there has been no change in her stools.    Denies fever, chills or blood in her stool.  Past Medical History:  Diagnosis Date  . Adenocarcinoma, colon ([place]) 07/23/2015   Removed completely by polypectomy 06/2015   . Allergy   . Anxiety   . Arthritis   . Endometriosis 08/19/2009   Hysterectomy.     . Family history of diabetes mellitus    . GERD (gastroesophageal reflux disease)   . Headache(784.0)   . Heart murmur    past hx of heart murmur  . Hyperlipidemia   . Hypertension   . Hypothyroidism   . Neutropenia   . Raynaud's syndrome   . Sjogrens syndrome ([place])   . Stricture and stenosis of esophagus     Past Surgical History:  Procedure Laterality Date  . ABDOMINAL HYSTERECTOMY    . BREAST BIOPSY     benign  . BREAST REDUCTION SURGERY    . COLONOSCOPY  08/19/2016  . HERNIA REPAIR    . POLYPECTOMY    . REDUCTION MAMMAPLASTY    . UPPER GASTROINTESTINAL ENDOSCOPY      Current Outpatient Medications  Medication Sig Dispense Refill  . Calcium Carbonate-Vitamin D (CALCIUM 600+D) 600-400 MG-UNIT tablet Take 1 tablet by mouth daily.    . cevimeline (EVOXAC) 30 MG capsule     . clotrimazole (CLOTRIMAZOLE ANTI-FUNGAL) 1 % cream Apply 1 application topically 2 (two) times daily. 30 g 0  . diphenoxylate-atropine (LOMOTIL) 2.5-0.025 MG tablet TAKE 1 TABLET BY MOUTH 4 (FOUR) TIMES DAILY AS NEEDED FOR DIARRHEA OR LOOSE STOOLS. 30 tablet 0  . diphenoxylate-atropine (LOMOTIL) 2.5-0.025 MG tablet Take 1 tablet by mouth 4 (four) times daily as needed for diarrhea or loose stools. 30 tablet 0  . DOTTI 0.05 MG/24HR patch PLACE 1  PATCH (0.05 MG TOTAL) ONTO THE SKIN 2 (TWO) TIMES A WEEK. PT PLACES ON WEDNESDAY AND SUNDAY. 24 patch 3  . escitalopram (LEXAPRO) 20 MG tablet TAKE 1 TABLET BY MOUTH ONCE DAILY 30 tablet 5  . fluticasone (FLONASE) 50 MCG/ACT nasal spray IMSTILL 2 SPRAYS INTO BOTH NOSTRILS DAILY. 16 g 2  . gabapentin (NEURONTIN) 300 MG capsule Take 300 mg by mouth as needed.    . hydroxychloroquine (PLAQUENIL) 200 MG tablet     . levothyroxine (SYNTHROID) 88 MCG tablet TAKE 1 TABLET BY MOUTH DAILY BEFORE BREAKFAST. 90 tablet 3  . Liraglutide -Weight Management (SAXENDA) 18 MG/3ML SOPN 0.6 mg daily x 1 week, then increase by 0.6 mg weekly for max dose of 3 mg daily 15 mL 5  . losartan (COZAAR) 50 MG tablet TAKE 1 TABLET (50  MG TOTAL) BY MOUTH DAILY. 30 tablet 5  . nystatin (MYCOSTATIN/NYSTOP) powder Apply 1 application topically 3 (three) times daily. 60 g 5  . rosuvastatin (CRESTOR) 20 MG tablet TAKE 1 TABLET (20 MG TOTAL) BY MOUTH DAILY. 30 tablet 1  . SF 5000 PLUS 1.1 % CREA dental cream Take by mouth.    Marland Kitchen tiZANidine (ZANAFLEX) 4 MG tablet TAKE 1 TABLET BY MOUTH TWICE A DAY AS NEEDED FOR MUSCLE SPASM 60 tablet 2  . UNIFINE PENTIPS 31G X 6 MM MISC USE AS DIRECTED WITH SAXENDA     No current facility-administered medications for this visit.    Allergies as of 09/24/2020 - Review Complete 09/24/2020  Allergen Reaction Noted  . Abilify [aripiprazole]  06/01/2017  . Codeine Nausea And Vomiting   . Omeprazole Hives 04/30/2015  . Meloxicam Itching and Rash 03/14/2007  . Oxycodone-aspirin Itching and Rash 03/14/2007  . Sulfamethoxazole Itching and Rash 03/14/2007    Family History  Problem Relation Age of Onset  . Coronary artery disease Father        age 46 - 21 stents, all 3 siblings with early cardiac disease  . Colon polyps Father   . Heart attack Father 14  . Heart disease Father   . Cirrhosis Mother        alcohol related  . Alcohol abuse Mother   . Ovarian cancer Maternal Aunt   . Stomach cancer Paternal Aunt   . Esophageal cancer Paternal Aunt   . Diabetes Paternal Grandmother        and aunt  . Colon polyps Sister   . Heart attack Paternal Uncle   . Colon cancer Neg Hx   . Rectal cancer Neg Hx     Social History   Socioeconomic History  . Marital status: Single    Spouse name: Not on file  . Number of children: Not on file  . Years of education: Not on file  . Highest education level: Not on file  Occupational History  . Not on file  Tobacco Use  . Smoking status: Never Smoker  . Smokeless tobacco: Never Used  Vaping Use  . Vaping Use: Never used  Substance and Sexual Activity  . Alcohol use: Yes    Alcohol/week: 0.0 standard drinks    Comment: occasionally  . Drug use: No   . Sexual activity: Not Currently    Comment: 1st intercourse- 21, partners- 3,   Other Topics Concern  . Not on file  Social History Narrative   Partner for 2 years in 2013. Prior 20 year relationship-still friends. No children. 1 dog-peekapoo.       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       Hobbies: golf, cooking, gardening, poker   Social Determinants of Health   Financial Resource Strain: Not on file  Food Insecurity: Not on file  Transportation Needs: Not on file  Physical Activity: Not on file  Stress: Not on file  Social Connections: Not on file  Intimate Partner Violence: Not on file    Review of Systems:    Constitutional: No weight loss, fever or chills Cardiovascular: No chest pain   Respiratory: No SOB  Gastrointestinal: See HPI and otherwise negative   Physical Exam:  Vital signs: BP 110/68   Pulse 67   Ht 5' 3.75" (1.619 m)   Wt 188 lb 9.6 oz (85.5 kg)   SpO2 98%   BMI 32.63 kg/m   Constitutional:   Pleasant overweight Caucasian female appears to be in NAD, Well developed, Well nourished, alert and cooperative Respiratory: Respirations even and unlabored. Lungs clear to auscultation bilaterally.   No wheezes, crackles, or rhonchi.  Cardiovascular: Normal S1, S2. No MRG. Regular rate and rhythm. No peripheral edema, cyanosis or pallor.  Gastrointestinal:  Soft, nondistended, nontender. No rebound or guarding. Normal bowel sounds. No appreciable masses or hepatomegaly. Rectal:  Not performed.  Psychiatric: Oriented to person, place and time. Demonstrates good judgement and reason without abnormal affect or behaviors.  RELEVANT LABS AND IMAGING: CBC    Component Value Date/Time   WBC 5.2 08/11/2020 1630   RBC 4.21 08/11/2020 1630   HGB 13.0 08/11/2020 1630   HCT 39.1 08/11/2020 1630   PLT 297.0 08/11/2020 1630   MCV 92.8 08/11/2020 1630   MCV 90.9 07/24/2014 1246   MCH 31.4  11/08/2017 0708   MCHC 33.3 08/11/2020 1630   RDW 13.7 08/11/2020 1630   LYMPHSABS 2.1 08/11/2020 1630   MONOABS 0.5 08/11/2020 1630   EOSABS 0.1 08/11/2020 1630   BASOSABS 0.1 08/11/2020 1630    CMP     Component Value Date/Time   NA 138 08/11/2020 1630   K 4.4 08/11/2020 1630   CL 102 08/11/2020 1630   CO2 30 08/11/2020 1630   GLUCOSE 82 08/11/2020 1630   BUN 13 08/11/2020 1630   CREATININE 0.80 08/11/2020 1630   CREATININE 0.69 07/24/2014 1244   CALCIUM 9.9 08/11/2020 1630   PROT 7.4 08/11/2020 1630   ALBUMIN 4.2 08/11/2020 1630   AST 19 08/11/2020 1630   ALT 16 08/11/2020 1630   ALKPHOS 56 08/11/2020 1630   BILITOT 0.3 08/11/2020 1630   GFRNONAA >60 11/08/2017 0708   GFRAA >60 11/08/2017 0708    Assessment: 1.  Change in bowel habits: Describes some incontinence and "mud" like stools over the past month and a half with some urgency but no real consistent abdominal pain, recent colonoscopy in December 2020 with 1 polyp and otherwise normal; consider IBS versus constipation versus diarrhea  Plan: 1.  Ordered an abdominal x-ray today to try and decipher whether patient is constipated with overflow or if she is truly experiencing diarrhea.  If x-ray shows constipation recommend MiraLAX once daily, titrated up to 4 times a day in order to achieve good soft solid bowel movements. 2.  If abdominal x-ray does not show constipation then would recommend further work-up with stool studies including a GI pathogen panel and O&P for diarrhea.  These tests were ordered today and patient picked up supplies, but she will not provide a sample until hearing from above. 3.  Patient to  follow in clinic per recommendations after testing above.  Ellouise Newer, PA-C [facility] 09/24/2020, 2:58 PM  Cc: Binnie Rail, MD

## 2020-09-25 MED FILL — SAXENDA 18 MG/3 ML PEN: 18 | 30 days supply | Qty: 15 | Fill #1

## 2020-09-25 MED FILL — NYSTATIN 100,000 UNIT/GM PO: 100000 | 20 days supply | Qty: 60 | Fill #1

## 2020-09-25 MED FILL — ESCITALOPRAM 20 MG TABLET: 20 | 30 days supply | Qty: 30 | Fill #4

## 2020-09-25 MED FILL — GABAPENTIN 300 MG CAPSULE: 300 | 30 days supply | Qty: 30 | Fill #1

## 2020-09-29 MED FILL — LOSARTAN POTASSIUM 50 MG TA: 50 | 30 days supply | Qty: 30 | Fill #5

## 2020-10-13 ENCOUNTER — Telehealth: Payer: Self-pay

## 2020-10-13 MED FILL — ROSUVASTATIN CALCIUM 20 MG: 20 | 30 days supply | Qty: 30 | Fill #1

## 2020-10-13 NOTE — Telephone Encounter (Signed)
-----   Message from Yevette Edwards, RN sent at 09/25/2020  9:56 AM EST ----- Regarding: Update Symptom update, see 09/24/20 X-ray result note for more information.

## 2020-10-13 NOTE — Telephone Encounter (Signed)
Called patient, vm is full, unable to leave a message at this time 

## 2020-10-15 NOTE — Telephone Encounter (Signed)
Lm on vm for patient to return call with symptom update.

## 2020-10-19 NOTE — Telephone Encounter (Signed)
Spoke with patient, she states that fiber may have helped a little bit, she states that her symptoms are not as bad as they were, pt states that she still hasn't left the stool samples because her father was in the hospital last week and she has been assisting him. Advised that she drop off the sample at her convenience, she states that she will try to get that done this week. Advised patient to continue fiber supplement, she is aware that once we have the stool study results Anderson Malta will advise on any further recommendations. Patient verbalized understanding of all information and thanked me for the follow up call.

## 2020-10-20 MED FILL — NYSTATIN 100,000 UNIT/GM PO: 100000 | 20 days supply | Qty: 60 | Fill #2

## 2020-10-22 ENCOUNTER — Other Ambulatory Visit: Payer: 59

## 2020-10-22 DIAGNOSIS — R140 Abdominal distension (gaseous): Secondary | ICD-10-CM

## 2020-10-22 DIAGNOSIS — R194 Change in bowel habit: Secondary | ICD-10-CM

## 2020-10-26 LAB — GI PROFILE, STOOL, PCR

## 2020-10-27 LAB — OVA AND PARASITE EXAMINATION
CONCENTRATE RESULT:: NONE SEEN
MICRO NUMBER:: 11519499
SPECIMEN QUALITY:: ADEQUATE
TRICHROME RESULT:: NONE SEEN

## 2020-10-27 MED FILL — FLUTICASONE PROP 50 MCG SPR: 50 | 30 days supply | Qty: 16 | Fill #2

## 2020-10-27 MED FILL — GABAPENTIN 300 MG CAPSULE: 300 | 30 days supply | Qty: 30 | Fill #2

## 2020-10-27 MED FILL — ESCITALOPRAM 20 MG TABLET: 20 | 30 days supply | Qty: 30 | Fill #5

## 2020-11-02 ENCOUNTER — Other Ambulatory Visit: Payer: Self-pay | Admitting: Internal Medicine

## 2020-11-02 MED FILL — LOSARTAN POTASSIUM 50 MG TA: 50 | 30 days supply | Qty: 30 | Fill #0

## 2020-11-09 MED FILL — LEVOTHYROXINE 88 MCG TABLET: 88 | 90 days supply | Qty: 90 | Fill #1

## 2020-11-26 ENCOUNTER — Other Ambulatory Visit: Payer: Self-pay | Admitting: Internal Medicine

## 2020-12-02 MED FILL — LOSARTAN POTASSIUM 50 MG TA: 50 | 30 days supply | Qty: 30 | Fill #1

## 2020-12-16 ENCOUNTER — Other Ambulatory Visit (HOSPITAL_COMMUNITY): Payer: Self-pay

## 2020-12-18 ENCOUNTER — Other Ambulatory Visit (HOSPITAL_COMMUNITY): Payer: Self-pay

## 2020-12-18 MED FILL — Nystatin Topical Powder 100000 Unit/GM: CUTANEOUS | 20 days supply | Qty: 60 | Fill #0 | Status: AC

## 2020-12-29 ENCOUNTER — Other Ambulatory Visit (HOSPITAL_COMMUNITY): Payer: Self-pay

## 2020-12-29 MED ORDER — AZELASTINE-FLUTICASONE 137-50 MCG/ACT NA SUSP
2.0000 | Freq: Every day | NASAL | 5 refills | Status: AC
  Filled 2020-12-29: qty 23, 30d supply, fill #0

## 2020-12-31 ENCOUNTER — Other Ambulatory Visit (HOSPITAL_COMMUNITY): Payer: Self-pay

## 2020-12-31 MED FILL — Losartan Potassium Tab 50 MG: ORAL | 30 days supply | Qty: 30 | Fill #0 | Status: AC

## 2020-12-31 MED FILL — Escitalopram Oxalate Tab 20 MG (Base Equiv): ORAL | 30 days supply | Qty: 30 | Fill #0 | Status: AC

## 2021-01-08 ENCOUNTER — Other Ambulatory Visit (HOSPITAL_COMMUNITY): Payer: Self-pay

## 2021-01-08 MED FILL — Hydroxychloroquine Sulfate Tab 200 MG: ORAL | 90 days supply | Qty: 180 | Fill #0 | Status: AC

## 2021-01-14 ENCOUNTER — Other Ambulatory Visit (HOSPITAL_COMMUNITY): Payer: Self-pay

## 2021-01-14 MED FILL — Rosuvastatin Calcium Tab 20 MG: ORAL | 30 days supply | Qty: 30 | Fill #0 | Status: AC

## 2021-01-18 ENCOUNTER — Other Ambulatory Visit: Payer: Self-pay | Admitting: Internal Medicine

## 2021-01-18 ENCOUNTER — Other Ambulatory Visit (HOSPITAL_COMMUNITY): Payer: Self-pay

## 2021-01-18 MED ORDER — NYSTATIN 100000 UNIT/GM EX POWD
CUTANEOUS | 3 refills | Status: AC
  Filled 2021-01-18: qty 60, 10d supply, fill #0
  Filled 2021-03-19: qty 60, 10d supply, fill #1

## 2021-01-28 MED FILL — Gabapentin Cap 300 MG: ORAL | 30 days supply | Qty: 30 | Fill #0 | Status: AC

## 2021-01-29 ENCOUNTER — Other Ambulatory Visit (HOSPITAL_COMMUNITY): Payer: Self-pay

## 2021-02-02 ENCOUNTER — Other Ambulatory Visit (HOSPITAL_COMMUNITY): Payer: Self-pay

## 2021-02-02 ENCOUNTER — Ambulatory Visit
Admission: RE | Admit: 2021-02-02 | Discharge: 2021-02-02 | Disposition: A | Discharge status: Home / Self Care | Payer: 59 | Source: Ambulatory Visit | Attending: Internal Medicine | Admitting: Internal Medicine

## 2021-02-02 ENCOUNTER — Other Ambulatory Visit: Payer: Self-pay

## 2021-02-02 DIAGNOSIS — Z780 Asymptomatic menopausal state: Secondary | ICD-10-CM | POA: Diagnosis not present

## 2021-02-02 DIAGNOSIS — M8589 Other specified disorders of bone density and structure, multiple sites: Secondary | ICD-10-CM

## 2021-02-02 MED FILL — Losartan Potassium Tab 50 MG: ORAL | 30 days supply | Qty: 30 | Fill #1 | Status: AC

## 2021-02-02 MED FILL — Escitalopram Oxalate Tab 20 MG (Base Equiv): ORAL | 30 days supply | Qty: 30 | Fill #1 | Status: AC

## 2021-02-03 DIAGNOSIS — H5213 Myopia, bilateral: Secondary | ICD-10-CM | POA: Diagnosis not present

## 2021-02-09 ENCOUNTER — Ambulatory Visit: Payer: 59 | Admitting: Internal Medicine

## 2021-02-18 ENCOUNTER — Other Ambulatory Visit (HOSPITAL_COMMUNITY): Payer: Self-pay

## 2021-02-18 MED FILL — Levothyroxine Sodium Tab 88 MCG: ORAL | 90 days supply | Qty: 90 | Fill #0 | Status: AC

## 2021-03-01 ENCOUNTER — Other Ambulatory Visit (HOSPITAL_COMMUNITY): Payer: Self-pay

## 2021-03-01 MED FILL — Tizanidine HCl Tab 4 MG (Base Equivalent): ORAL | 30 days supply | Qty: 60 | Fill #0 | Status: AC

## 2021-03-03 ENCOUNTER — Other Ambulatory Visit (HOSPITAL_COMMUNITY): Payer: Self-pay

## 2021-03-03 MED FILL — Escitalopram Oxalate Tab 20 MG (Base Equiv): ORAL | 30 days supply | Qty: 30 | Fill #2 | Status: AC

## 2021-03-03 MED FILL — Losartan Potassium Tab 50 MG: ORAL | 30 days supply | Qty: 30 | Fill #2 | Status: AC

## 2021-03-19 ENCOUNTER — Other Ambulatory Visit (HOSPITAL_COMMUNITY): Payer: Self-pay

## 2021-04-05 ENCOUNTER — Other Ambulatory Visit (HOSPITAL_COMMUNITY): Payer: Self-pay

## 2021-04-12 ENCOUNTER — Encounter: Payer: Self-pay | Admitting: Internal Medicine

## 2021-04-14 ENCOUNTER — Other Ambulatory Visit (HOSPITAL_COMMUNITY): Payer: Self-pay

## 2021-04-16 MED ORDER — TIRZEPATIDE 2.5 MG/0.5ML ~~LOC~~ SOAJ: 2.5000 mg | SUBCUTANEOUS | 0 refills | Status: DC

## 2021-04-19 ENCOUNTER — Telehealth: Payer: Self-pay | Admitting: Internal Medicine

## 2021-04-19 NOTE — Telephone Encounter (Signed)
   Patient requesting refill for escitalopram (LEXAPRO) 20 MG tablet   Margaret Green

## 2021-04-20 ENCOUNTER — Other Ambulatory Visit: Payer: Self-pay

## 2021-04-20 MED ORDER — ESCITALOPRAM OXALATE 20 MG PO TABS: ORAL_TABLET | Freq: Every day | ORAL | 5 refills | Status: DC

## 2021-04-20 NOTE — Telephone Encounter (Signed)
Sent in today 

## 2021-04-21 ENCOUNTER — Other Ambulatory Visit: Payer: Self-pay | Admitting: Internal Medicine

## 2021-04-22 ENCOUNTER — Other Ambulatory Visit (HOSPITAL_COMMUNITY): Payer: Self-pay

## 2021-04-22 ENCOUNTER — Telehealth: Payer: Self-pay | Admitting: Internal Medicine

## 2021-04-22 ENCOUNTER — Other Ambulatory Visit: Payer: Self-pay

## 2021-04-22 MED ORDER — TIZANIDINE HCL 4 MG PO TABS: ORAL_TABLET | ORAL | 2 refills | Status: DC

## 2021-04-22 MED ORDER — ROSUVASTATIN CALCIUM 20 MG PO TABS: 20.0000 mg | ORAL_TABLET | Freq: Every day | ORAL | 1 refills | Status: DC

## 2021-04-22 MED ORDER — ROSUVASTATIN CALCIUM 20 MG PO TABS
20.0000 mg | ORAL_TABLET | Freq: Every day | ORAL | 1 refills | Status: DC
  Filled 2021-04-22: qty 30, 30d supply, fill #0

## 2021-04-22 NOTE — Telephone Encounter (Signed)
1.Medication Requested: tiZANidine (ZANAFLEX) 4 MG tablet   2. Pharmacy (Name, [place], [facility]): [place], [place] [address]  Phone:  [phone] Fax:  [phone]   3. On Med List: yes  4. Last Visit with PCP: 05.31.22  5. Next visit date with PCP: n/a   Agent: Please be advised that RX refills may take up to 3 business days. We ask that you follow-up with your pharmacy.

## 2021-05-01 ENCOUNTER — Other Ambulatory Visit: Payer: Self-pay | Admitting: Internal Medicine

## 2021-05-01 MED ORDER — LOSARTAN POTASSIUM 50 MG PO TABS: ORAL_TABLET | Freq: Every day | ORAL | 1 refills | Status: DC

## 2021-05-01 NOTE — Addendum Note (Signed)
Addended by: Binnie Rail on: 05/01/2021 11:03 AM   Modules accepted: Orders

## 2021-05-10 ENCOUNTER — Other Ambulatory Visit: Payer: Self-pay | Admitting: Internal Medicine

## 2021-05-10 ENCOUNTER — Other Ambulatory Visit (HOSPITAL_COMMUNITY): Payer: Self-pay

## 2021-05-10 ENCOUNTER — Encounter: Payer: Self-pay | Admitting: Internal Medicine

## 2021-05-10 MED ORDER — TIRZEPATIDE 2.5 MG/0.5ML ~~LOC~~ SOAJ: 2.5000 mg | SUBCUTANEOUS | 0 refills | Status: DC

## 2021-05-10 MED ORDER — MOUNJARO 5 MG/0.5ML ~~LOC~~ SOAJ
5.0000 mg | SUBCUTANEOUS | 0 refills | Status: DC
  Filled 2021-05-10: qty 2, 28d supply, fill #0

## 2021-05-18 ENCOUNTER — Other Ambulatory Visit (HOSPITAL_COMMUNITY): Payer: Self-pay

## 2021-05-25 ENCOUNTER — Other Ambulatory Visit (HOSPITAL_COMMUNITY): Payer: Self-pay

## 2021-06-10 MED ORDER — MOUNJARO 5 MG/0.5ML ~~LOC~~ SOAJ: 5.0000 mg | SUBCUTANEOUS | 0 refills | Status: DC

## 2021-06-10 NOTE — Addendum Note (Signed)
Addended by: Binnie Rail on: 06/10/2021 07:27 AM   Modules accepted: Orders

## 2021-06-16 ENCOUNTER — Telehealth: Payer: Self-pay

## 2021-06-18 NOTE — Telephone Encounter (Signed)
PA started.  (Key: [id number])

## 2021-07-07 ENCOUNTER — Other Ambulatory Visit: Payer: Self-pay

## 2021-07-07 ENCOUNTER — Other Ambulatory Visit (HOSPITAL_COMMUNITY): Payer: Self-pay

## 2021-07-07 MED ORDER — MOUNJARO 5 MG/0.5ML ~~LOC~~ SOAJ
5.0000 mg | SUBCUTANEOUS | 0 refills | Status: DC
  Filled 2021-07-07: qty 2, 28d supply, fill #0

## 2021-07-09 MED ORDER — TIRZEPATIDE 7.5 MG/0.5ML ~~LOC~~ SOAJ: 7.5000 mg | SUBCUTANEOUS | 2 refills | Status: DC

## 2021-07-09 NOTE — Addendum Note (Signed)
Addended by: Binnie Rail on: 07/09/2021 12:59 PM   Modules accepted: Orders

## 2021-07-19 ENCOUNTER — Other Ambulatory Visit: Payer: Self-pay | Admitting: Internal Medicine

## 2021-07-31 ENCOUNTER — Other Ambulatory Visit: Payer: Self-pay | Admitting: Internal Medicine

## 2021-08-03 ENCOUNTER — Encounter: Payer: Self-pay | Admitting: Internal Medicine

## 2021-08-06 MED ORDER — TIRZEPATIDE 5 MG/0.5ML ~~LOC~~ SOAJ: 5.0000 mg | SUBCUTANEOUS | 0 refills | Status: DC

## 2021-08-13 ENCOUNTER — Encounter: Payer: 59 | Admitting: Internal Medicine

## 2021-08-26 ENCOUNTER — Encounter: Payer: Self-pay | Admitting: Internal Medicine

## 2021-08-26 NOTE — Patient Instructions (Addendum)
Blood work was ordered.     Medications changes include :   none  Your prescription(s) have been submitted to your pharmacy. Please take as directed and contact our office if you believe you are having problem(s) with the medication(s).    Please followup in 6 months   Health Maintenance, Female Adopting a healthy lifestyle and getting preventive care are important in promoting health and wellness. Ask your health care provider about: The right schedule for you to have regular tests and exams. Things you can do on your own to prevent diseases and keep yourself healthy. What should I know about diet, weight, and exercise? Eat a healthy diet  Eat a diet that includes plenty of vegetables, fruits, low-fat dairy products, and lean protein. Do not eat a lot of foods that are high in solid fats, added sugars, or sodium. Maintain a healthy weight Body mass index (BMI) is used to identify weight problems. It estimates body fat based on height and weight. Your health care provider can help determine your BMI and help you achieve or maintain a healthy weight. Get regular exercise Get regular exercise. This is one of the most important things you can do for your health. Most adults should: Exercise for at least 150 minutes each week. The exercise should increase your heart rate and make you sweat (moderate-intensity exercise). Do strengthening exercises at least twice a week. This is in addition to the moderate-intensity exercise. Spend less time sitting. Even light physical activity can be beneficial. Watch cholesterol and blood lipids Have your blood tested for lipids and cholesterol at 59 years of age, then have this test every 5 years. Have your cholesterol levels checked more often if: Your lipid or cholesterol levels are high. You are older than 59 years of age. You are at high risk for heart disease. What should I know about cancer screening? Depending on your health history and  family history, you may need to have cancer screening at various ages. This may include screening for: Breast cancer. Cervical cancer. Colorectal cancer. Skin cancer. Lung cancer. What should I know about heart disease, diabetes, and high blood pressure? Blood pressure and heart disease High blood pressure causes heart disease and increases the risk of stroke. This is more likely to develop in people who have high blood pressure readings or are overweight. Have your blood pressure checked: Every 3-5 years if you are 7-43 years of age. Every year if you are 37 years old or older. Diabetes Have regular diabetes screenings. This checks your fasting blood sugar level. Have the screening done: Once every three years after age 61 if you are at a normal weight and have a low risk for diabetes. More often and at a younger age if you are overweight or have a high risk for diabetes. What should I know about preventing infection? Hepatitis B If you have a higher risk for hepatitis B, you should be screened for this virus. Talk with your health care provider to find out if you are at risk for hepatitis B infection. Hepatitis C Testing is recommended for: Everyone born from [birth year] through 1964. Anyone with known risk factors for hepatitis C. Sexually transmitted infections (STIs) Get screened for STIs, including gonorrhea and chlamydia, if: You are sexually active and are younger than 59 years of age. You are older than 59 years of age and your health care provider tells you that you are at risk for this type of infection. Your sexual activity has changed  since you were last screened, and you are at increased risk for chlamydia or gonorrhea. Ask your health care provider if you are at risk. Ask your health care provider about whether you are at high risk for HIV. Your health care provider may recommend a prescription medicine to help prevent HIV infection. If you choose to take medicine to prevent HIV,  you should first get tested for HIV. You should then be tested every 3 months for as long as you are taking the medicine. Pregnancy If you are about to stop having your period (premenopausal) and you may become pregnant, seek counseling before you get pregnant. Take 400 to 800 micrograms (mcg) of folic acid every day if you become pregnant. Ask for birth control (contraception) if you want to prevent pregnancy. Osteoporosis and menopause Osteoporosis is a disease in which the bones lose minerals and strength with aging. This can result in bone fractures. If you are 39 years old or older, or if you are at risk for osteoporosis and fractures, ask your health care provider if you should: Be screened for bone loss. Take a calcium or vitamin D supplement to lower your risk of fractures. Be given hormone replacement therapy (HRT) to treat symptoms of menopause. Follow these instructions at home: Alcohol use Do not drink alcohol if: Your health care provider tells you not to drink. You are pregnant, may be pregnant, or are planning to become pregnant. If you drink alcohol: Limit how much you have to: 0-1 drink a day. Know how much alcohol is in your drink. In the [place] one drink equals one 12 oz bottle of beer (355 mL), one 5 oz glass of wine (148 mL), or one 1 oz glass of hard liquor (44 mL). Lifestyle Do not use any products that contain nicotine or tobacco. These products include cigarettes, chewing tobacco, and vaping devices, such as e-cigarettes. If you need help quitting, ask your health care provider. Do not use street drugs. Do not share needles. Ask your health care provider for help if you need support or information about quitting drugs. General instructions Schedule regular health, dental, and eye exams. Stay current with your vaccines. Tell your health care provider if: You often feel depressed. You have ever been abused or do not feel safe at home. Summary Adopting a healthy  lifestyle and getting preventive care are important in promoting health and wellness. Follow your health care provider's instructions about healthy diet, exercising, and getting tested or screened for diseases. Follow your health care provider's instructions on monitoring your cholesterol and blood pressure. This information is not intended to replace advice given to you by your health care provider. Make sure you discuss any questions you have with your health care provider. Document Revised: 01/18/2021 Document Reviewed: 01/18/2021 Elsevier Patient Education  [place].

## 2021-08-26 NOTE — Progress Notes (Signed)
Subjective:    Patient ID: Margaret Green, female    DOB: 1961-02-11, 60 y.o.   MRN: [id number]   This visit occurred during the SARS-CoV-2 public health emergency.  Safety protocols were in place, including screening questions prior to the visit, additional usage of staff PPE, and extensive cleaning of exam room while observing appropriate contact time as indicated for disinfecting solutions.    HPI She is here for a physical exam.   She feels good and has no concerns.  She has lost weight and that has helped a lot of things.    Medications and allergies reviewed with patient and updated if appropriate.  Patient Active Problem List   Diagnosis Date Noted   Lumbosacral radiculopathy at S1 08/12/2019   Vitamin D deficiency 08/12/2019   Obesity (BMI 30.0-34.9) 02/05/2019   Vertigo 07/17/2018   B12 deficiency 07/02/2018   Prediabetes 12/23/2017   Osteopenia 08/10/2017   Multinodular goiter 03/28/2017   Chest pain with low risk for cardiac etiology 01/02/2017   Sjogren's disease ([place]) 08/27/2015   Depression 07/23/2015   History of colon cancer, stage I 07/23/2015   Migraine 08/14/2014   Allergic rhinitis 08/14/2014   IBS (irritable bowel syndrome) 08/14/2014   Essential hypertension, benign 08/14/2014   GERD (gastroesophageal reflux disease) 08/14/2014   GAD (generalized anxiety disorder) 09/29/2010   ANEMIA, PERNICIOUS 02/24/2009   Hyperlipidemia 04/13/2007   RAYNAUD'S DISEASE 04/13/2007   Osteoarthritis 04/13/2007   Fibromyalgia 04/13/2007   Hypothyroidism 04/03/2007    Current Outpatient Medications on File Prior to Visit  Medication Sig Dispense Refill   Azelastine-Fluticasone 137-50 MCG/ACT SUSP Place 2 sprays into both nostrils daily. 23 g 5   Calcium Carbonate-Vitamin D 600-400 MG-UNIT tablet Take 1 tablet by mouth daily.     clotrimazole (CLOTRIMAZOLE ANTI-FUNGAL) 1 % cream Apply 1 application topically 2 (two) times daily. (Patient taking differently:  Apply 1 application topically as needed.) 30 g 0   diphenoxylate-atropine (LOMOTIL) 2.5-0.025 MG tablet Take 1 tablet by mouth 4 (four) times daily as needed for diarrhea or loose stools. 30 tablet 0   DOTTI 0.05 MG/24HR patch PLACE 1 PATCH (0.05 MG TOTAL) ONTO THE SKIN 2 (TWO) TIMES A WEEK. PT PLACES ON WEDNESDAY AND SUNDAY. 24 patch 3   escitalopram (LEXAPRO) 20 MG tablet TAKE 1 TABLET BY MOUTH ONCE DAILY 30 tablet 5   gabapentin (NEURONTIN) 300 MG capsule Take 300 mg by mouth as needed.     losartan (COZAAR) 50 MG tablet TAKE 1 TABLET (50 MG TOTAL) BY MOUTH DAILY. 90 tablet 1   nystatin (MYCOSTATIN/NYSTOP) powder APPLY TOPICALLY TO AFFECTED AREA THREE TIMES DAILY. 60 g 3   rosuvastatin (CRESTOR) 20 MG tablet Take 1 tablet (20 mg total) by mouth daily. 30 tablet 1   SF 5000 PLUS 1.1 % CREA dental cream Take 1 application by mouth at bedtime.     tirzepatide [facility]) 5 MG/0.5ML Pen Inject 5 mg into the skin once a week. 6 mL 0   tiZANidine (ZANAFLEX) 4 MG tablet TAKE 1 TABLET BY MOUTH TWICE A DAY AS NEEDED FOR MUSCLE SPASM 60 tablet 2   UNIFINE PENTIPS 31G X 6 MM MISC USE AS DIRECTED WITH SAXENDA     fluticasone (FLONASE) 50 MCG/ACT nasal spray INSTILL 2 SPRAYS INTO BOTH NOSTRILS DAILY. 16 g 2   gabapentin (NEURONTIN) 300 MG capsule TAKE 1 CAPSULE BY MOUTH ONCE DAILY 30 capsule 5   levothyroxine (SYNTHROID) 88 MCG tablet TAKE 1 TABLET BY MOUTH  DAILY BEFORE BREAKFAST. 90 tablet 3   TYRVAYA 0.03 MG/ACT SOLN SMARTSIG:1 Spray(s) Both Nares Twice a Week     No current facility-administered medications on file prior to visit.    Past Medical History:  Diagnosis Date   Adenocarcinoma, colon ([place]) 07/23/2015   Removed completely by polypectomy 06/2015    Allergy    Anxiety    Arthritis    Endometriosis 08/19/2009   Hysterectomy.      Family history of diabetes mellitus    GERD (gastroesophageal reflux disease)    Headache(784.0)    Heart murmur    past hx of heart murmur   Hyperlipidemia     Hypertension    Hypothyroidism    Neutropenia    Raynaud's syndrome    Sjogrens syndrome (H[facility])    Stricture and stenosis of esophagus     Past Surgical History:  Procedure Laterality Date   ABDOMINAL HYSTERECTOMY     BREAST BIOPSY     benign   BREAST REDUCTION SURGERY     COLONOSCOPY  08/19/2016   HERNIA REPAIR     POLYPECTOMY     REDUCTION MAMMAPLASTY     UPPER GASTROINTESTINAL ENDOSCOPY      Social History   Socioeconomic History   Marital status: Single    Spouse name: Not on file   Number of children: Not on file   Years of education: Not on file   Highest education level: Not on file  Occupational History   Not on file  Tobacco Use   Smoking status: Never   Smokeless tobacco: Never  Vaping Use   Vaping Use: Never used  Substance and Sexual Activity   Alcohol use: Yes    Alcohol/week: 0.0 standard drinks    Comment: occasionally   Drug use: No   Sexual activity: Not Currently    Comment: 1st intercourse- 21, partners- 3,   Other Topics Concern   Not on file  Social History Narrative   Partner for 2 years in 2013. Prior 20 year relationship-still friends. No children. 1 dog-peekapoo.       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       Hobbies: golf, cooking, gardening, poker   Social Determinants of Health   Financial Resource Strain: Not on file  Food Insecurity: Not on file  Transportation Needs: Not on file  Physical Activity: Not on file  Stress: Not on file  Social Connections: Not on file    Family History  Problem Relation Age of Onset   Coronary artery disease Father        age 33 - 26 stents, all 3 siblings with early cardiac disease   Colon polyps Father    Heart attack Father 8   Heart disease Father    Cirrhosis Mother        alcohol related   Alcohol abuse Mother    Ovarian cancer Maternal Aunt    Stomach cancer Paternal Aunt    Esophageal cancer Paternal  Aunt    Diabetes Paternal Grandmother        and aunt   Colon polyps Sister    Heart attack Paternal Uncle    Colon cancer Neg Hx    Rectal cancer Neg Hx     Review of Systems  Constitutional:  Negative for chills and fever.  Eyes:  Negative for visual disturbance.  Respiratory:  Negative for cough, shortness of breath and wheezing.   Cardiovascular:  Negative for chest pain, palpitations and leg swelling.  Gastrointestinal:  Negative for abdominal pain, blood in stool, constipation, diarrhea and nausea.       No gerd  Genitourinary:  Negative for dysuria and hematuria.  Musculoskeletal:  Positive for arthralgias and myalgias.  Skin:  Negative for color change and rash.  Neurological:  Negative for light-headedness and headaches.  Psychiatric/Behavioral:  Negative for dysphoric mood. The patient is not nervous/anxious.       Objective:   Vitals:   08/27/21 1506  BP: 116/80  Pulse: 64  Temp: 98.1 F (36.7 C)  SpO2: 99%   Filed Weights   08/27/21 1506  Weight: 158 lb (71.7 kg)   Body mass index is 27.33 kg/m.  BP Readings from Last 3 Encounters:  08/27/21 116/80  09/24/20 110/68  08/11/20 130/78    Wt Readings from Last 3 Encounters:  08/27/21 158 lb (71.7 kg)  09/24/20 188 lb 9.6 oz (85.5 kg)  08/11/20 187 lb (84.8 kg)    Depression screen [facility] 2/9 02/12/2020 08/20/2019 02/05/2019 03/31/2017  Decreased Interest 0 0 0 1  Down, Depressed, Hopeless 0 0 0 2  PHQ - 2 Score 0 0 0 3  Altered sleeping - 2 0 3  Tired, decreased energy - 0 0 3  Change in appetite - 0 0 3  Feeling bad or failure about yourself  - 0 0 0  Trouble concentrating - 0 0 3  Moving slowly or fidgety/restless - 0 0 0  Suicidal thoughts - 0 0 0  PHQ-9 Score - 2 0 15  Some recent data might be hidden     GAD 7 : Generalized Anxiety Score 08/20/2019 03/31/2017  Nervous, Anxious, on Edge 0 3  Control/stop worrying 0 3  Worry too much - different things 0 3  Trouble relaxing 0 3  Restless 0 0   Easily annoyed or irritable 0 0  Afraid - awful might happen 0 0  Total GAD 7 Score 0 12       Physical Exam Constitutional: She appears well-developed and well-nourished. No distress.  HENT:  Head: Normocephalic and atraumatic.  Right Ear: External ear normal. Normal ear canal and TM Left Ear: External ear normal.  Normal ear canal and TM Mouth/Throat: Oropharynx is clear and moist.  Eyes: Conjunctivae and EOM are normal.  Neck: Neck supple. No tracheal deviation present. No thyromegaly present.  No carotid bruit  Cardiovascular: Normal rate, regular rhythm and normal heart sounds.   No murmur heard.  No edema. Pulmonary/Chest: Effort normal and breath sounds normal. No respiratory distress. She has no wheezes. She has no rales.  Breast: deferred   Abdominal: Soft. She exhibits no distension. There is no tenderness.  Lymphadenopathy: She has no cervical adenopathy.  Skin: Skin is warm and dry. She is not diaphoretic.  Psychiatric: She has a normal mood and affect. Her behavior is normal.     Lab Results  Component Value Date   WBC 4.9 08/27/2021   HGB 14.4 08/27/2021   HCT 44.3 08/27/2021   PLT 258 08/27/2021   GLUCOSE 86 08/27/2021   CHOL 153 08/27/2021   TRIG 105 08/27/2021   HDL 43 (L) 08/27/2021   LDLDIRECT 103.0 02/05/2019   LDLCALC 90 08/27/2021   ALT 23 08/27/2021   AST 22 08/27/2021   NA 140 08/27/2021   K 4.2 08/27/2021   CL 103 08/27/2021   CREATININE 0.76 08/27/2021   BUN 8 08/27/2021   CO2 29 08/27/2021  TSH 0.07 (L) 08/27/2021   HGBA1C 5.4 08/27/2021         Assessment & Plan:   Physical exam: Screening blood work  ordered Exercise  regular - biking, walking dog Weight  working on weight loss Substance abuse  none   Reviewed recommended immunizations.   Health Maintenance  Topic Date Due   MAMMOGRAM  10/05/2020   COVID-19 Vaccine (1) 09/12/2021 (Originally 07/10/1961)   Zoster Vaccines- Shingrix (1 of 2) 11/25/2021 (Originally  01/09/2012)   DEXA SCAN  02/03/2023   COLONOSCOPY (Pts 45-54yrs Insurance coverage will need to be confirmed)  09/01/2024   TETANUS/TDAP  06/21/2027   INFLUENZA VACCINE  Completed   Hepatitis C Screening  Completed   HIV Screening  Completed   Pneumococcal Vaccine 11-59 Years old  Aged Out   HPV VACCINES  Aged Out          See Problem List for Assessment and Plan of chronic medical problems.

## 2021-08-27 ENCOUNTER — Other Ambulatory Visit: Payer: Self-pay

## 2021-08-27 ENCOUNTER — Ambulatory Visit (INDEPENDENT_AMBULATORY_CARE_PROVIDER_SITE_OTHER): Payer: BC Managed Care – PPO | Admitting: Internal Medicine

## 2021-08-27 VITALS — BP 116/80 | HR 64 | Temp 98.1°F | Ht 63.75 in | Wt 158.0 lb

## 2021-08-27 DIAGNOSIS — Z0000 Encounter for general adult medical examination without abnormal findings: Secondary | ICD-10-CM

## 2021-08-27 DIAGNOSIS — E538 Deficiency of other specified B group vitamins: Secondary | ICD-10-CM

## 2021-08-27 DIAGNOSIS — F411 Generalized anxiety disorder: Secondary | ICD-10-CM

## 2021-08-27 DIAGNOSIS — M797 Fibromyalgia: Secondary | ICD-10-CM

## 2021-08-27 DIAGNOSIS — F3289 Other specified depressive episodes: Secondary | ICD-10-CM | POA: Diagnosis not present

## 2021-08-27 DIAGNOSIS — I10 Essential (primary) hypertension: Secondary | ICD-10-CM

## 2021-08-27 DIAGNOSIS — E039 Hypothyroidism, unspecified: Secondary | ICD-10-CM

## 2021-08-27 DIAGNOSIS — M8589 Other specified disorders of bone density and structure, multiple sites: Secondary | ICD-10-CM

## 2021-08-27 DIAGNOSIS — E559 Vitamin D deficiency, unspecified: Secondary | ICD-10-CM

## 2021-08-27 DIAGNOSIS — R7303 Prediabetes: Secondary | ICD-10-CM

## 2021-08-28 LAB — CBC WITH DIFFERENTIAL/PLATELET
Absolute Monocytes: 397 cells/uL (ref 200–950)
Basophils Absolute: 10 cells/uL (ref 0–200)
Basophils Relative: 0.2 %
Eosinophils Absolute: 39 cells/uL (ref 15–500)
Eosinophils Relative: 0.8 %
HCT: 44.3 % (ref 35.0–45.0)
Hemoglobin: 14.4 g/dL (ref 11.7–15.5)
Lymphs Abs: 2200 cells/uL (ref 850–3900)
MCH: 30.3 pg (ref 27.0–33.0)
MCHC: 32.5 g/dL (ref 32.0–36.0)
MCV: 93.1 fL (ref 80.0–100.0)
MPV: 10.2 fL (ref 7.5–12.5)
Monocytes Relative: 8.1 %
Neutro Abs: 2254 cells/uL (ref 1500–7800)
Neutrophils Relative %: 46 %
Platelets: 258 10*3/uL (ref 140–400)
RBC: 4.76 10*6/uL (ref 3.80–5.10)
RDW: 12.5 % (ref 11.0–15.0)
Total Lymphocyte: 44.9 %
WBC: 4.9 10*3/uL (ref 3.8–10.8)

## 2021-08-28 LAB — COMPREHENSIVE METABOLIC PANEL
AG Ratio: 1.5 (calc) (ref 1.0–2.5)
ALT: 23 U/L (ref 6–29)
AST: 22 U/L (ref 10–35)
Albumin: 4.4 g/dL (ref 3.6–5.1)
Alkaline phosphatase (APISO): 66 U/L (ref 37–153)
BUN: 8 mg/dL (ref 7–25)
CO2: 29 mmol/L (ref 20–32)
Calcium: 9.6 mg/dL (ref 8.6–10.4)
Chloride: 103 mmol/L (ref 98–110)
Creat: 0.76 mg/dL (ref 0.50–1.03)
Globulin: 2.9 g/dL (calc) (ref 1.9–3.7)
Glucose, Bld: 86 mg/dL (ref 65–99)
Potassium: 4.2 mmol/L (ref 3.5–5.3)
Sodium: 140 mmol/L (ref 135–146)
Total Bilirubin: 0.6 mg/dL (ref 0.2–1.2)
Total Protein: 7.3 g/dL (ref 6.1–8.1)

## 2021-08-28 LAB — VITAMIN B12: Vitamin B-12: 643 pg/mL (ref 200–1100)

## 2021-08-28 LAB — LIPID PANEL
Cholesterol: 153 mg/dL (ref <200)
HDL: 43 mg/dL — ABNORMAL LOW (ref >=50)
LDL Cholesterol (Calc): 90 mg/dL (calc)
Non-HDL Cholesterol (Calc): 110 mg/dL (calc) (ref <130)
Total CHOL/HDL Ratio: 3.6 (calc) (ref <5.0)
Triglycerides: 105 mg/dL (ref <150)

## 2021-08-28 LAB — VITAMIN D 25 HYDROXY (VIT D DEFICIENCY, FRACTURES): Vit D, 25-Hydroxy: 31 ng/mL (ref 30–100)

## 2021-08-28 LAB — HEMOGLOBIN A1C
Hgb A1c MFr Bld: 5.4 % of total Hgb (ref <5.7)
Mean Plasma Glucose: 108 mg/dL
eAG (mmol/L): 6 mmol/L

## 2021-08-28 LAB — TSH: TSH: 0.07 mIU/L — ABNORMAL LOW (ref 0.40–4.50)

## 2021-08-28 NOTE — Assessment & Plan Note (Signed)
Chronic Pain improved with weight loss and exercise/stretching Continue tizanidine 4 mg bid prn

## 2021-08-28 NOTE — Assessment & Plan Note (Signed)
Chronic BP well controlled and on the low side Weight loss had lowered her BP Continue losartan but she will try taking 25 mg only and monitor her BP - if ok may try to d/c and monitor closely or if needed stay on 25 mg daily  cmp

## 2021-08-28 NOTE — Assessment & Plan Note (Signed)
Chronic ?Check B12 level ?

## 2021-08-28 NOTE — Assessment & Plan Note (Signed)
Chronic Controlled, stable Continue lexparo 20 mg daily

## 2021-08-28 NOTE — Assessment & Plan Note (Signed)
Chronic Check a1c Low sugar / carb diet Stressed regular exercise  

## 2021-08-28 NOTE — Assessment & Plan Note (Signed)
Chronic Managed by Dr Loanne Drilling

## 2021-08-28 NOTE — Assessment & Plan Note (Signed)
Chronic dexa up to date Continue regular exercise Taking vitamin d and calcium

## 2021-08-28 NOTE — Assessment & Plan Note (Signed)
Chronic Taking vitamin D daily Check vitamin D level  

## 2021-08-28 NOTE — Assessment & Plan Note (Signed)
Chronic Controlled, stable Continue lexapro 20 mg daily  

## 2021-08-29 MED ORDER — LEVOTHYROXINE SODIUM 75 MCG PO TABS: 75.0000 ug | ORAL_TABLET | Freq: Every day | ORAL | 3 refills | Status: DC

## 2021-08-29 NOTE — Addendum Note (Signed)
Addended by: Binnie Rail on: 08/29/2021 04:11 PM   Modules accepted: Orders

## 2021-08-31 ENCOUNTER — Telehealth: Payer: Self-pay | Admitting: Internal Medicine

## 2021-08-31 MED ORDER — TIRZEPATIDE 2.5 MG/0.5ML ~~LOC~~ SOAJ: 2.5000 mg | SUBCUTANEOUS | 0 refills | Status: DC

## 2021-08-31 NOTE — Telephone Encounter (Signed)
Patient calling to verify correct insurance on file  Verified subscriber Id and verified policy through epic, both verified  Patient states she received a bill for $300 for previous ov on 08-27-2021

## 2021-08-31 NOTE — Addendum Note (Signed)
Addended by: Binnie Rail on: 08/31/2021 07:18 AM   Modules accepted: Orders

## 2021-09-08 ENCOUNTER — Other Ambulatory Visit: Payer: Self-pay | Admitting: Internal Medicine

## 2021-09-14 ENCOUNTER — Encounter: Payer: Self-pay | Admitting: Internal Medicine

## 2021-09-14 MED ORDER — HYDROCOD POLST-CPM POLST ER 10-8 MG/5ML PO SUER: 5.0000 mL | Freq: Two times a day (BID) | ORAL | 0 refills | Status: DC | PRN

## 2021-09-20 MED ORDER — ALBUTEROL SULFATE HFA 108 (90 BASE) MCG/ACT IN AERS: 2.0000 | INHALATION_SPRAY | Freq: Four times a day (QID) | RESPIRATORY_TRACT | 0 refills | Status: DC | PRN

## 2021-09-20 NOTE — Telephone Encounter (Signed)
Patient calling in  Wants to know what provider suggest about the wheezing she is experiencing.. see prev messages below  Please call [phone]

## 2021-09-20 NOTE — Addendum Note (Signed)
Addended by: Binnie Rail on: 09/20/2021 09:01 PM   Modules accepted: Orders

## 2021-09-22 ENCOUNTER — Other Ambulatory Visit: Payer: Self-pay | Admitting: Internal Medicine

## 2021-09-23 MED ORDER — HYDROCOD POLST-CPM POLST ER 10-8 MG/5ML PO SUER: 5.0000 mL | Freq: Two times a day (BID) | ORAL | 0 refills | Status: DC | PRN

## 2021-09-24 ENCOUNTER — Other Ambulatory Visit: Payer: Self-pay | Admitting: Internal Medicine

## 2021-09-24 MED ORDER — HYDROCOD POLST-CPM POLST ER 10-8 MG/5ML PO SUER: 5.0000 mL | Freq: Two times a day (BID) | ORAL | 0 refills | Status: DC | PRN

## 2021-09-30 ENCOUNTER — Telehealth: Payer: Self-pay | Admitting: Internal Medicine

## 2021-10-03 ENCOUNTER — Encounter: Payer: Self-pay | Admitting: Internal Medicine

## 2021-10-03 NOTE — Progress Notes (Signed)
Subjective:    Patient ID: Margaret Green, female    DOB: 04-19-1961, 61 y.o.   MRN: [id number]  This visit occurred during the SARS-CoV-2 public health emergency.  Safety protocols were in place, including screening questions prior to the visit, additional usage of staff PPE, and extensive cleaning of exam room while observing appropriate contact time as indicated for disinfecting solutions.    HPI The patient is here for an acute visit.   She had a recent URI and is still coughing and wheezing.  She was seen at urgent care initially.  She completed an abx.  She had a wet cough the whole time but would not bring anything up.    She is still wheezing.  She still coughs - sounds wet but not able to bring anything up.  Hears rattle in chest.  The cough is getting better.    She is taking tylenol prn and cough syrup at night.     Medications and allergies reviewed with patient and updated if appropriate.  Patient Active Problem List   Diagnosis Date Noted   Lumbosacral radiculopathy at S1 08/12/2019   Vitamin D deficiency 08/12/2019   Obesity (BMI 30.0-34.9) 02/05/2019   Vertigo 07/17/2018   B12 deficiency 07/02/2018   Prediabetes 12/23/2017   Osteopenia 08/10/2017   Multinodular goiter 03/28/2017   Chest pain with low risk for cardiac etiology 01/02/2017   Sjogren's disease ([place]) 08/27/2015   Depression 07/23/2015   History of colon cancer, stage I 07/23/2015   Migraine 08/14/2014   Allergic rhinitis 08/14/2014   IBS (irritable bowel syndrome) 08/14/2014   Essential hypertension, benign 08/14/2014   GERD (gastroesophageal reflux disease) 08/14/2014   GAD (generalized anxiety disorder) 09/29/2010   ANEMIA, PERNICIOUS 02/24/2009   Hyperlipidemia 04/13/2007   RAYNAUD'S DISEASE 04/13/2007   Osteoarthritis 04/13/2007   Fibromyalgia 04/13/2007   Hypothyroidism 04/03/2007    Current Outpatient Medications on File Prior to Visit  Medication Sig Dispense Refill    albuterol (VENTOLIN HFA) 108 (90 Base) MCG/ACT inhaler Inhale 2 puffs into the lungs every 6 (six) hours as needed for wheezing or shortness of breath. 8 g 0   Azelastine-Fluticasone 137-50 MCG/ACT SUSP Place 2 sprays into both nostrils daily. 23 g 5   Calcium Carbonate-Vitamin D 600-400 MG-UNIT tablet Take 1 tablet by mouth daily.     chlorpheniramine-HYDROcodone (TUSSIONEX PENNKINETIC ER) 10-8 MG/5ML SUER Take 5 mLs by mouth every 12 (twelve) hours as needed for cough. 115 mL 0   clotrimazole (CLOTRIMAZOLE ANTI-FUNGAL) 1 % cream Apply 1 application topically 2 (two) times daily. (Patient taking differently: Apply 1 application topically as needed.) 30 g 0   diphenoxylate-atropine (LOMOTIL) 2.5-0.025 MG tablet Take 1 tablet by mouth 4 (four) times daily as needed for diarrhea or loose stools. 30 tablet 0   DOTTI 0.05 MG/24HR patch PLACE 1 PATCH (0.05 MG TOTAL) ONTO THE SKIN 2 (TWO) TIMES A WEEK. PT PLACES ON WEDNESDAY AND SUNDAY. 24 patch 3   escitalopram (LEXAPRO) 20 MG tablet TAKE 1 TABLET BY MOUTH ONCE DAILY 30 tablet 5   gabapentin (NEURONTIN) 300 MG capsule Take 300 mg by mouth as needed.     levothyroxine (SYNTHROID) 75 MCG tablet Take 1 tablet (75 mcg total) by mouth daily. 90 tablet 3   losartan (COZAAR) 50 MG tablet TAKE 1 TABLET (50 MG TOTAL) BY MOUTH DAILY. 90 tablet 1   nystatin (MYCOSTATIN/NYSTOP) powder APPLY TOPICALLY TO AFFECTED AREA THREE TIMES DAILY. 60 g 3   rosuvastatin (  CRESTOR) 20 MG tablet Take 1 tablet (20 mg total) by mouth daily. 30 tablet 1   SF 5000 PLUS 1.1 % CREA dental cream Take 1 application by mouth at bedtime.     tirzepatide [facility]) 2.5 MG/0.5ML Pen Inject 2.5 mg into the skin once a week. 2 mL 0   tirzepatide (MOUNJARO) 5 MG/0.5ML Pen Inject 5 mg into the skin once a week. 6 mL 0   tiZANidine (ZANAFLEX) 4 MG tablet TAKE ONE TABLET BY MOUTH TWICE DAILY AS NEEDED FOR MUSCLE SPASMS 60 tablet 2   TYRVAYA 0.03 MG/ACT SOLN SMARTSIG:1 Spray(s) Both Nares Twice a  Week     UNIFINE PENTIPS 31G X 6 MM MISC USE AS DIRECTED WITH SAXENDA     fluticasone (FLONASE) 50 MCG/ACT nasal spray INSTILL 2 SPRAYS INTO BOTH NOSTRILS DAILY. 16 g 2   gabapentin (NEURONTIN) 300 MG capsule TAKE 1 CAPSULE BY MOUTH ONCE DAILY 30 capsule 5   No current facility-administered medications on file prior to visit.    Past Medical History:  Diagnosis Date   Adenocarcinoma, colon ([place]) 07/23/2015   Removed completely by polypectomy 06/2015    Allergy    Anxiety    Arthritis    Endometriosis 08/19/2009   Hysterectomy.      Family history of diabetes mellitus    GERD (gastroesophageal reflux disease)    Headache(784.0)    Heart murmur    past hx of heart murmur   Hyperlipidemia    Hypertension    Hypothyroidism    Neutropenia    Raynaud's syndrome    Sjogrens syndrome ([facility])    Stricture and stenosis of esophagus     Past Surgical History:  Procedure Laterality Date   ABDOMINAL HYSTERECTOMY     BREAST BIOPSY     benign   BREAST REDUCTION SURGERY     COLONOSCOPY  08/19/2016   HERNIA REPAIR     POLYPECTOMY     REDUCTION MAMMAPLASTY     UPPER GASTROINTESTINAL ENDOSCOPY      Social History   Socioeconomic History   Marital status: Single    Spouse name: Not on file   Number of children: Not on file   Years of education: Not on file   Highest education level: Not on file  Occupational History   Not on file  Tobacco Use   Smoking status: Never   Smokeless tobacco: Never  Vaping Use   Vaping Use: Never used  Substance and Sexual Activity   Alcohol use: Yes    Alcohol/week: 0.0 standard drinks    Comment: occasionally   Drug use: No   Sexual activity: Not Currently    Comment: 1st intercourse- 21, partners- 3,   Other Topics Concern   Not on file  Social History Narrative   Partner for 2 years in 2013. Prior 20 year relationship-still friends. No children. 1 dog-peekapoo.       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       Hobbies: golf, cooking, gardening, poker   Social Determinants of Health   Financial Resource Strain: Not on file  Food Insecurity: Not on file  Transportation Needs: Not on file  Physical Activity: Not on file  Stress: Not on file  Social Connections: Not on file    Family History  Problem Relation Age of Onset   Coronary artery disease Father        age 80 - 55 stents, all  3 siblings with early cardiac disease   Colon polyps Father    Heart attack Father 20   Heart disease Father    Cirrhosis Mother        alcohol related   Alcohol abuse Mother    Ovarian cancer Maternal Aunt    Stomach cancer Paternal Aunt    Esophageal cancer Paternal Aunt    Diabetes Paternal Grandmother        and aunt   Colon polyps Sister    Heart attack Paternal Uncle    Colon cancer Neg Hx    Rectal cancer Neg Hx     Review of Systems  Constitutional:  Negative for fever.  HENT:  Negative for congestion, ear pain (fullness in ears), postnasal drip, sinus pain and sore throat.   Respiratory:  Positive for cough, shortness of breath (stairs, exertion) and wheezing. Negative for chest tightness.   Cardiovascular:  Negative for chest pain.  Gastrointestinal:  Negative for abdominal pain and nausea.  Neurological:  Positive for dizziness. Negative for headaches.      Objective:   Vitals:   10/04/21 0803  BP: 110/80  Pulse: 60  Temp: 98.2 F (36.8 C)  SpO2: 99%   BP Readings from Last 3 Encounters:  10/04/21 110/80  08/27/21 116/80  09/24/20 110/68   Wt Readings from Last 3 Encounters:  10/04/21 151 lb 3.2 oz (68.6 kg)  08/27/21 158 lb (71.7 kg)  09/24/20 188 lb 9.6 oz (85.5 kg)   Body mass index is 26.16 kg/m.   Physical Exam    GENERAL APPEARANCE: Appears stated age, well appearing, NAD EYES: conjunctiva clear, no icterus HENT: bilateral tympanic membranes and ear canals normal, oropharynx with no erythema or exudates, trachea  midline, no cervical or supraclavicular lymphadenopathy LUNGS: Unlabored breathing, good air entry bilaterally, clear to auscultation without wheeze or crackles CARDIOVASCULAR: Normal S1,S2 , no edema SKIN: Warm, dry      Assessment & Plan:    See Problem List for Assessment and Plan of chronic medical problems.

## 2021-10-03 NOTE — Patient Instructions (Addendum)
° ° °  Medications changes include :   none    Continue symptomatic treatment.

## 2021-10-04 ENCOUNTER — Ambulatory Visit: Payer: BC Managed Care – PPO | Admitting: Internal Medicine

## 2021-10-04 ENCOUNTER — Other Ambulatory Visit: Payer: Self-pay

## 2021-10-04 DIAGNOSIS — J069 Acute upper respiratory infection, unspecified: Secondary | ICD-10-CM | POA: Insufficient documentation

## 2021-10-04 NOTE — Assessment & Plan Note (Signed)
Acute visit Recovering from [place] already completed antibiotics Taking Tussionex cough syrup as needed Still with wet sounding cough that is nonproductive, occasional wheeze-no other concerning symptoms Symptoms are improving and I think they are just residual from her URI Lungs clear on exam No additional treatment needed Continue cough syrup at night, Tylenol as needed Can take Mucinex to help thin the mucus Expect her symptoms to continue to improve over the next several days-she will call if they do not or if she has any questions

## 2021-10-08 ENCOUNTER — Encounter: Payer: Self-pay | Admitting: Internal Medicine

## 2021-10-10 MED ORDER — HYDROCOD POLI-CHLORPHE POLI ER 10-8 MG/5ML PO SUER: 5.0000 mL | Freq: Two times a day (BID) | ORAL | 0 refills | Status: DC | PRN

## 2021-10-11 ENCOUNTER — Encounter: Payer: Self-pay | Admitting: Internal Medicine

## 2021-10-11 NOTE — Telephone Encounter (Signed)
BCBS Rep called in  Says once patient has been prescribed WeGovy a PA will need to be completed through Cover My Meds

## 2021-10-13 MED ORDER — SEMAGLUTIDE-WEIGHT MANAGEMENT 0.25 MG/0.5ML ~~LOC~~ SOAJ: 0.2500 mg | SUBCUTANEOUS | 0 refills | Status: AC

## 2021-10-15 ENCOUNTER — Telehealth: Payer: Self-pay

## 2021-10-15 NOTE — Telephone Encounter (Signed)
(  Key: [id number])  Your information has been submitted to [place]. [employer] [place] will review the request and notify you of the determination decision directly, typically within 72 hours of receiving all information.  You will also receive your request decision electronically. To check for an update later, open this request again from your dashboard.  If [employer] [place] has not responded within the specified timeframe or if you have any questions about your PA submission, contact [place] directly at [phone].

## 2021-10-22 ENCOUNTER — Other Ambulatory Visit: Payer: Self-pay | Admitting: Internal Medicine

## 2021-10-23 ENCOUNTER — Encounter: Payer: Self-pay | Admitting: Internal Medicine

## 2021-10-25 ENCOUNTER — Encounter: Payer: Self-pay | Admitting: Internal Medicine

## 2021-10-27 ENCOUNTER — Telehealth: Payer: Self-pay | Admitting: Internal Medicine

## 2021-10-27 ENCOUNTER — Encounter: Payer: Self-pay | Admitting: Internal Medicine

## 2021-10-27 MED ORDER — ONDANSETRON HCL 4 MG PO TABS: 4.0000 mg | ORAL_TABLET | Freq: Three times a day (TID) | ORAL | 0 refills | Status: DC | PRN

## 2021-10-27 NOTE — Telephone Encounter (Signed)
Message sent to patient via mychart

## 2021-10-27 NOTE — Telephone Encounter (Signed)
Zofran sent to [place] [facility].

## 2021-10-27 NOTE — Telephone Encounter (Signed)
Patient calling in  Patient says she woke up from migraine & was severely nauseous.. wants to know if provider can send something to pharmacy for nausea  Patient says she tried OTC pepto & also ginger ale & nothing is helping as she is still feeling nauseated   Please call [phone]

## 2021-10-28 ENCOUNTER — Telehealth: Payer: Self-pay

## 2021-10-28 NOTE — Telephone Encounter (Signed)
Member appeal faxed to [facility] today to seek approval for drug [facility]

## 2021-11-04 ENCOUNTER — Encounter: Payer: Self-pay | Admitting: Internal Medicine

## 2021-11-04 ENCOUNTER — Other Ambulatory Visit: Payer: Self-pay

## 2021-11-04 MED ORDER — TIRZEPATIDE 5 MG/0.5ML ~~LOC~~ SOAJ: 5.0000 mg | SUBCUTANEOUS | 0 refills | Status: DC

## 2021-11-16 ENCOUNTER — Other Ambulatory Visit: Payer: Self-pay

## 2021-11-16 ENCOUNTER — Ambulatory Visit: Payer: BC Managed Care – PPO | Admitting: Internal Medicine

## 2021-11-16 ENCOUNTER — Encounter: Payer: Self-pay | Admitting: Internal Medicine

## 2021-11-16 VITALS — BP 110/78 | HR 80 | Temp 98.1°F | Ht 63.75 in | Wt 148.0 lb

## 2021-11-16 DIAGNOSIS — S61258A Open bite of other finger without damage to nail, initial encounter: Secondary | ICD-10-CM

## 2021-11-16 DIAGNOSIS — E039 Hypothyroidism, unspecified: Secondary | ICD-10-CM

## 2021-11-16 DIAGNOSIS — W540XXA Bitten by dog, initial encounter: Secondary | ICD-10-CM | POA: Diagnosis not present

## 2021-11-16 DIAGNOSIS — I10 Essential (primary) hypertension: Secondary | ICD-10-CM

## 2021-11-16 LAB — TSH: TSH: 0.15 u[IU]/mL — ABNORMAL LOW (ref 0.35–5.50)

## 2021-11-16 MED ORDER — AMOXICILLIN-POT CLAVULANATE 875-125 MG PO TABS: 1.0000 | ORAL_TABLET | Freq: Two times a day (BID) | ORAL | 0 refills | Status: AC

## 2021-11-16 MED ORDER — FLUCONAZOLE 150 MG PO TABS: 150.0000 mg | ORAL_TABLET | Freq: Once | ORAL | 0 refills | Status: AC

## 2021-11-16 NOTE — Assessment & Plan Note (Signed)
Chronic ?To recheck tsh today at lab ?Will titrate med dose if needed ?

## 2021-11-16 NOTE — Assessment & Plan Note (Signed)
Chronic ?BP well controlled with losartan 25 mg daily ?BP normal  ?She has lost a lot of weight ?Will try stopping losartan 25 mg daily and she will monitor BP at home to see if it is controlled ?

## 2021-11-16 NOTE — Assessment & Plan Note (Signed)
Acute ?Occurred yesterday - did not bleed a lot ?tdap up to date ?It was her dog - up to date on vaccines ?There is swelling, pain - improved swelling, but very painful -- concern for beginning of an infection ?Start Augmentin 875-125 mg bid 7 days - +/- depending on response  ?

## 2021-11-16 NOTE — Patient Instructions (Addendum)
? ? ? ? ? ?  Medications changes include :  Augmentin 875-125 mg twice daily for 5-7 days most likely - depending on infection. ? ?Stop losartan.   ? ? ?Your prescription(s) have been sent to your pharmacy.  ? ? ? ?Return if symptoms worsen or fail to improve. ? ?

## 2021-11-16 NOTE — Progress Notes (Signed)
? ? ?Subjective:  ? ? Patient ID: Margaret Green, female    DOB: 11-03-1960, 61 y.o.   MRN: [id number] ? ?This visit occurred during the SARS-CoV-2 public health emergency.  Safety protocols were in place, including screening questions prior to the visit, additional usage of staff PPE, and extensive cleaning of exam room while observing appropriate contact time as indicated for disinfecting solutions. ? ? ? ?HPI ?Margaret Green is here for  ?Chief Complaint  ?Patient presents with  ? Animal Bite  ?  Dog bite on Sunday  ? ? ?Small dog bite right finger puncture wound - occurred yesterday.  It was her dog - they were playing with a toy.  The puncture wound is on the anterior surface of distal right index finger.  There is swelling, but it is less than last night.  The finger hurts.  There is no discharge.  No N/T.   No fever/chills.   ? ? ?She is taking 1/2 losartan as discussed.  BP well controlled - like today.   ? ? ? ? ?Medications and allergies reviewed with patient and updated if appropriate. ? ?Current Outpatient Medications on File Prior to Visit  ?Medication Sig Dispense Refill  ? Azelastine-Fluticasone 137-50 MCG/ACT SUSP Place 2 sprays into both nostrils daily. 23 g 5  ? Calcium Carbonate-Vitamin D 600-400 MG-UNIT tablet Take 1 tablet by mouth daily.    ? clotrimazole (CLOTRIMAZOLE ANTI-FUNGAL) 1 % cream Apply 1 application topically 2 (two) times daily. (Patient taking differently: Apply 1 application. topically as needed.) 30 g 0  ? diphenoxylate-atropine (LOMOTIL) 2.5-0.025 MG tablet Take 1 tablet by mouth 4 (four) times daily as needed for diarrhea or loose stools. 30 tablet 0  ? DOTTI 0.05 MG/24HR patch PLACE 1 PATCH (0.05 MG TOTAL) ONTO THE SKIN 2 (TWO) TIMES A WEEK. PT PLACES ON WEDNESDAY AND SUNDAY. 24 patch 3  ? escitalopram (LEXAPRO) 20 MG tablet TAKE 1 TABLET BY MOUTH ONCE DAILY 30 tablet 5  ? gabapentin (NEURONTIN) 300 MG capsule Take 300 mg by mouth as needed.    ? levothyroxine (SYNTHROID) 75  MCG tablet Take 1 tablet (75 mcg total) by mouth daily. 90 tablet 3  ? nystatin (MYCOSTATIN/NYSTOP) powder APPLY TOPICALLY TO AFFECTED AREA THREE TIMES DAILY. 60 g 3  ? ondansetron (ZOFRAN) 4 MG tablet Take 1 tablet (4 mg total) by mouth every 8 (eight) hours as needed for nausea or vomiting. 20 tablet 0  ? rosuvastatin (CRESTOR) 20 MG tablet TAKE ONE TABLET BY MOUTH DAILY 30 tablet 1  ? SF 5000 PLUS 1.1 % CREA dental cream Take 1 application by mouth at bedtime.    ? tirzepatide (MOUNJARO) 5 MG/0.5ML Pen Inject 5 mg into the skin once a week. 6 mL 0  ? tiZANidine (ZANAFLEX) 4 MG tablet TAKE ONE TABLET BY MOUTH TWICE DAILY AS NEEDED FOR MUSCLE SPASMS 60 tablet 2  ? TYRVAYA 0.03 MG/ACT SOLN SMARTSIG:1 Spray(s) Both Nares Twice a Week    ? UNIFINE PENTIPS 31G X 6 MM MISC USE AS DIRECTED WITH SAXENDA    ? fluticasone (FLONASE) 50 MCG/ACT nasal spray INSTILL 2 SPRAYS INTO BOTH NOSTRILS DAILY. 16 g 2  ? gabapentin (NEURONTIN) 300 MG capsule TAKE 1 CAPSULE BY MOUTH ONCE DAILY 30 capsule 5  ? WEGOVY 0.25 MG/0.5ML SOAJ SMARTSIG:0.25 Milligram(s) SUB-Q Once a Week    ? ?No current facility-administered medications on file prior to visit.  ? ? ?Review of Systems ? ?   ?Objective:  ? ?Vitals:  ?  11/16/21 1447  ?BP: 110/78  ?Pulse: 80  ?Temp: 98.1 ?F (36.7 ?C)  ?SpO2: 99%  ? ?BP Readings from Last 3 Encounters:  ?11/16/21 110/78  ?10/04/21 110/80  ?08/27/21 116/80  ? ?Wt Readings from Last 3 Encounters:  ?11/16/21 148 lb (67.1 kg)  ?10/04/21 151 lb 3.2 oz (68.6 kg)  ?08/27/21 158 lb (71.7 kg)  ? ?Body mass index is 25.6 kg/m?. ? ?  ?Physical Exam ?Constitutional:   ?   General: She is not in acute distress. ?   Appearance: Normal appearance. She is not ill-appearing.  ?HENT:  ?   Head: Normocephalic.  ?Skin: ?   General: Skin is warm and dry.  ?   Comments: Right anterior distal index finger with puncture wound - no bleeding or drainage now.  Normal sensation.  DIP joint not involved.  Mild swelling and erythema around puncture  wound  ?Neurological:  ?   Mental Status: She is alert.  ? ?   ? ? ? ? ? ?Assessment & Plan:  ? ? ?See Problem List for Assessment and Plan of chronic medical problems.  ? ? ? ? ?

## 2021-11-17 MED ORDER — LEVOTHYROXINE SODIUM 50 MCG PO TABS: 50.0000 ug | ORAL_TABLET | Freq: Every day | ORAL | 3 refills | Status: DC

## 2021-11-17 NOTE — Addendum Note (Signed)
Addended by: Binnie Rail on: 11/17/2021 12:54 PM ? ? Modules accepted: Orders ? ?

## 2021-11-19 ENCOUNTER — Encounter: Payer: Self-pay | Admitting: Internal Medicine

## 2021-11-30 ENCOUNTER — Encounter: Payer: Self-pay | Admitting: Internal Medicine

## 2021-12-06 ENCOUNTER — Other Ambulatory Visit: Payer: Self-pay | Admitting: Internal Medicine

## 2021-12-07 ENCOUNTER — Encounter: Payer: Self-pay | Admitting: Internal Medicine

## 2021-12-07 MED ORDER — SEMAGLUTIDE-WEIGHT MANAGEMENT 0.5 MG/0.5ML ~~LOC~~ SOAJ: 0.5000 mg | SUBCUTANEOUS | 0 refills | Status: DC

## 2021-12-08 ENCOUNTER — Encounter: Payer: Self-pay | Admitting: Internal Medicine

## 2021-12-16 ENCOUNTER — Encounter: Payer: Self-pay | Admitting: Endocrinology

## 2021-12-16 ENCOUNTER — Ambulatory Visit: Payer: BC Managed Care – PPO | Admitting: Endocrinology

## 2021-12-16 VITALS — BP 162/98 | Ht 63.75 in | Wt 149.0 lb

## 2021-12-16 DIAGNOSIS — E039 Hypothyroidism, unspecified: Secondary | ICD-10-CM | POA: Diagnosis not present

## 2021-12-16 LAB — TSH: TSH: 1.96 u[IU]/mL (ref 0.35–5.50)

## 2021-12-16 LAB — T4, FREE: Free T4: 0.94 ng/dL (ref 0.60–1.60)

## 2021-12-16 NOTE — Patient Instructions (Addendum)
Blood tests are requested for you today.  We'll let you know about the results.  

## 2021-12-16 NOTE — Progress Notes (Signed)
? ?Subjective:  ? ? Patient ID: Margaret Green, female    DOB: [birth year], 61 y.o.   MRN: [id number] ? ?HPI ?Pt returns for MNG and chronic primary hypothyroidism (hypothyroidism was dx'ed 1982; she has been on prescribed thyroid hormone therapy since then; over the past few years, the synthroid dosage has varied between 75 and 100 mcg/day; [place] in 2015 showed small multinodular goiter; f/u US in 2020 advised no f/u needed).  pt states she feels well in general.  She does notice any change in the size of the goiter.   ?Past Medical History:  ?Diagnosis Date  ? Adenocarcinoma, colon ([place]) 07/23/2015  ? Removed completely by polypectomy 06/2015   ? Allergy   ? Anxiety   ? Arthritis   ? Endometriosis 08/19/2009  ? Hysterectomy.     ? Family history of diabetes mellitus   ? GERD (gastroesophageal reflux disease)   ? Headache(784.0)   ? Heart murmur   ? past hx of heart murmur  ? Hyperlipidemia   ? Hypertension   ? Hypothyroidism   ? Neutropenia   ? Raynaud's syndrome   ? Sjogrens syndrome ([place])   ? Stricture and stenosis of esophagus   ? ? ?Past Surgical History:  ?Procedure Laterality Date  ? ABDOMINAL HYSTERECTOMY    ? BREAST BIOPSY    ? benign  ? BREAST REDUCTION SURGERY    ? COLONOSCOPY  08/19/2016  ? HERNIA REPAIR    ? POLYPECTOMY    ? REDUCTION MAMMAPLASTY    ? UPPER GASTROINTESTINAL ENDOSCOPY    ? ? ?Social History  ? ?Socioeconomic History  ? Marital status: Single  ?  Spouse name: Not on file  ? Number of children: Not on file  ? Years of education: Not on file  ? Highest education level: Not on file  ?Occupational History  ? Not on file  ?Tobacco Use  ? Smoking status: Never  ? Smokeless tobacco: Never  ?Vaping Use  ? Vaping Use: Never used  ?Substance and Sexual Activity  ? Alcohol use: Yes  ?  Alcohol/week: 0.0 standard drinks  ?  Comment: occasionally  ? Drug use: No  ? Sexual activity: Not Currently  ?  Comment: 1st intercourse- 38, partners- 3,   ?Other Topics Concern  ? Not on file  ?Social History  Narrative  ? Partner for 2 years in 2013. Prior 20 year relationship-still friends. No children. 1 dog-peekapoo.   ?   ? 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   ?   ? Hobbies: golf, cooking, gardening, poker  ? ?Social Determinants of Health  ? ?Financial Resource Strain: Not on file  ?Food Insecurity: Not on file  ?Transportation Needs: Not on file  ?Physical Activity: Not on file  ?Stress: Not on file  ?Social Connections: Not on file  ?Intimate Partner Violence: Not on file  ? ? ?Current Outpatient Medications on File Prior to Visit  ?Medication Sig Dispense Refill  ? Azelastine-Fluticasone 137-50 MCG/ACT SUSP Place 2 sprays into both nostrils daily. 23 g 5  ? Calcium Carbonate-Vitamin D 600-400 MG-UNIT tablet Take 1 tablet by mouth daily.    ? clotrimazole (CLOTRIMAZOLE ANTI-FUNGAL) 1 % cream Apply 1 application topically 2 (two) times daily. (Patient taking differently: Apply 1 application. topically as needed.) 30 g 0  ? diphenoxylate-atropine (LOMOTIL) 2.5-0.025 MG tablet Take 1 tablet by mouth 4 (four) times daily as needed for diarrhea or loose stools. 30 tablet  0  ? escitalopram (LEXAPRO) 20 MG tablet TAKE 1 TABLET BY MOUTH ONCE DAILY 30 tablet 5  ? levothyroxine (SYNTHROID) 50 MCG tablet Take 1 tablet (50 mcg total) by mouth daily. 90 tablet 3  ? nystatin (MYCOSTATIN/NYSTOP) powder APPLY TOPICALLY TO AFFECTED AREA THREE TIMES DAILY. 60 g 3  ? ondansetron (ZOFRAN) 4 MG tablet Take 1 tablet (4 mg total) by mouth every 8 (eight) hours as needed for nausea or vomiting. 20 tablet 0  ? rosuvastatin (CRESTOR) 20 MG tablet TAKE ONE TABLET BY MOUTH DAILY 30 tablet 1  ? [START ON 01/05/2022] Semaglutide-Weight Management 0.5 MG/0.5ML SOAJ Inject 0.5 mg into the skin once a week for 28 days. 2 mL 0  ? SF 5000 PLUS 1.1 % CREA dental cream Take 1 application by mouth at bedtime.    ? tiZANidine (ZANAFLEX) 4 MG tablet TAKE ONE TABLET BY MOUTH TWICE  DAILY AS NEEDED FOR MUSCLE SPASMS 60 tablet 2  ? TYRVAYA 0.03 MG/ACT SOLN SMARTSIG:1 Spray(s) Both Nares Twice a Week    ? UNIFINE PENTIPS 31G X 6 MM MISC USE AS DIRECTED WITH SAXENDA    ? fluticasone (FLONASE) 50 MCG/ACT nasal spray INSTILL 2 SPRAYS INTO BOTH NOSTRILS DAILY. 16 g 2  ? ?No current facility-administered medications on file prior to visit.  ? ? ?Allergies  ?Allergen Reactions  ? Abilify [Aripiprazole]   ?  Nosebleeds  ? Codeine Nausea And Vomiting  ? Omeprazole Hives  ? Albuterol Other (See Comments)  ?  jittery  ? Meloxicam Itching and Rash  ? Oxycodone-Aspirin Itching and Rash  ? Sulfamethoxazole Itching and Rash  ? ? ?Family History  ?Problem Relation Age of Onset  ? Coronary artery disease Father   ?     age 33 - 49 stents, all 3 siblings with early cardiac disease  ? Colon polyps Father   ? Heart attack Father 53  ? Heart disease Father   ? Cirrhosis Mother   ?     alcohol related  ? Alcohol abuse Mother   ? Ovarian cancer Maternal Aunt   ? Stomach cancer Paternal Aunt   ? Esophageal cancer Paternal Aunt   ? Diabetes Paternal Grandmother   ?     and aunt  ? Colon polyps Sister   ? Heart attack Paternal Uncle   ? Colon cancer Neg Hx   ? Rectal cancer Neg Hx   ? ? ?BP (!) 162/98   Ht 5' 3.75" (1.619 m)   Wt 149 lb (67.6 kg)   BMI 25.78 kg/m?  ? ? ?Review of Systems ? ?   ?Objective:  ? Physical Exam ?VITAL SIGNS:  See vs page ?GENERAL: no distress ?NECK: small multinodular goiter is again noted.  No palpable lymphadenopathy at the anterior neck.    ? ? ?Lab Results  ?Component Value Date  ? TSH 1.96 12/16/2021  ? ?   ?Assessment & Plan:  ?Hypothyroidism: well-controlled.  Please continue the same synthroid ? ?

## 2021-12-31 ENCOUNTER — Ambulatory Visit (INDEPENDENT_AMBULATORY_CARE_PROVIDER_SITE_OTHER): Payer: BC Managed Care – PPO

## 2021-12-31 ENCOUNTER — Ambulatory Visit: Payer: BC Managed Care – PPO | Admitting: Internal Medicine

## 2021-12-31 ENCOUNTER — Encounter: Payer: Self-pay | Admitting: Internal Medicine

## 2021-12-31 VITALS — BP 130/80 | HR 60 | Temp 98.2°F | Ht 63.75 in | Wt 150.8 lb

## 2021-12-31 DIAGNOSIS — M533 Sacrococcygeal disorders, not elsewhere classified: Secondary | ICD-10-CM | POA: Diagnosis not present

## 2021-12-31 MED ORDER — METHYLPREDNISOLONE ACETATE 80 MG/ML IJ SUSP
80.0000 mg | Freq: Once | INTRAMUSCULAR | Status: AC
  Administered 2021-12-31: 80 mg via INTRAMUSCULAR

## 2021-12-31 MED ORDER — MELOXICAM 15 MG PO TABS: 15.0000 mg | ORAL_TABLET | Freq: Every day | ORAL | 0 refills | Status: DC

## 2021-12-31 NOTE — Assessment & Plan Note (Signed)
Acute ?Right lower back pain-likely SI joint dysfunction ?Tizanidine 4 mg at night taking-?  Help some-okay to continue ?Depo-Medrol 80 mg IM x1 ?Meloxicam 15 mg daily with food-not to be taken more than 1 month and she will try to take this for the shortest time possible ?X-ray today ?Refer to sports medicine for further evaluation ?Ice, heat ?Advise activities ?She will update me on her symptoms next week ?

## 2021-12-31 NOTE — Patient Instructions (Addendum)
? ? ?  You had a steroid injection ? ? ?Have an xray downstairs. ? ?Medications changes include :   meloxicam 15 mg with food.  ? ?Ice alternating with heat.  Continue the tizanidine.   ? ? ?Your prescription(s) have been sent to your pharmacy.  ? ? ?Sports med referral ordered.   Right lower back pain, ? SI joint, ? OMT ? ?

## 2021-12-31 NOTE — Progress Notes (Signed)
? ? ?Subjective:  ? ? Patient ID: Margaret Green, female    DOB: 1961/04/26, 61 y.o.   MRN: [id number] ? ?This visit occurred during the SARS-CoV-2 public health emergency.  Safety protocols were in place, including screening questions prior to the visit, additional usage of staff PPE, and extensive cleaning of exam room while observing appropriate contact time as indicated for disinfecting solutions. ? ? ? ?HPI ?Margaret Green is here for  ?Chief Complaint  ?Patient presents with  ? Hip Pain  ?  Right hip spasms  ? ? ?2 nights ago she rolled over in bed and felt a knot in her right lower back.  She started having spasms intermittently in this area and difficulty sleeping because of the spasms.  The whole next day she was very sore.  Last night she had spasms again and it took her over an hour to be able to get out of bed in the [place] to go to the bathroom.  She still has soreness there and can feel a knot there.  She has difficulty lifting her leg, crossing her leg.  Sitting is uncomfortable.  Laying is worse because that is when the severe spasms come.  She denies any lumbar spine pain, leg pain, numbness or tingling.  There is no radiation of the pain. ? ?She denies any injuries or new activities. ? ? ?Medications and allergies reviewed with patient and updated if appropriate. ? ?Current Outpatient Medications on File Prior to Visit  ?Medication Sig Dispense Refill  ? Azelastine-Fluticasone 137-50 MCG/ACT SUSP Place 2 sprays into both nostrils daily. 23 g 5  ? Calcium Carbonate-Vitamin D 600-400 MG-UNIT tablet Take 1 tablet by mouth daily.    ? clotrimazole (CLOTRIMAZOLE ANTI-FUNGAL) 1 % cream Apply 1 application topically 2 (two) times daily. (Patient taking differently: Apply 1 application. topically as needed.) 30 g 0  ? diphenoxylate-atropine (LOMOTIL) 2.5-0.025 MG tablet Take 1 tablet by mouth 4 (four) times daily as needed for diarrhea or loose stools. 30 tablet 0  ? escitalopram (LEXAPRO) 20 MG tablet  TAKE 1 TABLET BY MOUTH ONCE DAILY 30 tablet 5  ? levothyroxine (SYNTHROID) 50 MCG tablet Take 1 tablet (50 mcg total) by mouth daily. 90 tablet 3  ? MOUNJARO 5 MG/0.5ML Pen SMARTSIG:5 Milligram(s) SUB-Q Once a Week    ? nystatin (MYCOSTATIN/NYSTOP) powder APPLY TOPICALLY TO AFFECTED AREA THREE TIMES DAILY. 60 g 3  ? ondansetron (ZOFRAN) 4 MG tablet Take 1 tablet (4 mg total) by mouth every 8 (eight) hours as needed for nausea or vomiting. 20 tablet 0  ? rosuvastatin (CRESTOR) 20 MG tablet TAKE ONE TABLET BY MOUTH DAILY 30 tablet 1  ? [START ON 01/05/2022] Semaglutide-Weight Management 0.5 MG/0.5ML SOAJ Inject 0.5 mg into the skin once a week for 28 days. 2 mL 0  ? SF 5000 PLUS 1.1 % CREA dental cream Take 1 application by mouth at bedtime.    ? tiZANidine (ZANAFLEX) 4 MG tablet TAKE ONE TABLET BY MOUTH TWICE DAILY AS NEEDED FOR MUSCLE SPASMS 60 tablet 2  ? TYRVAYA 0.03 MG/ACT SOLN SMARTSIG:1 Spray(s) Both Nares Twice a Week    ? UNIFINE PENTIPS 31G X 6 MM MISC USE AS DIRECTED WITH SAXENDA    ? fluticasone (FLONASE) 50 MCG/ACT nasal spray INSTILL 2 SPRAYS INTO BOTH NOSTRILS DAILY. 16 g 2  ? ?No current facility-administered medications on file prior to visit.  ? ? ?Review of Systems ? ?   ?Objective:  ? ?Vitals:  ? 12/31/21 1325  ?  BP: (!) 144/80  ?Pulse: 60  ?Temp: 98.2 ?F (36.8 ?C)  ?SpO2: 99%  ? ?BP Readings from Last 3 Encounters:  ?12/31/21 (!) 144/80  ?12/16/21 (!) 162/98  ?11/16/21 110/78  ? ?Wt Readings from Last 3 Encounters:  ?12/31/21 150 lb 12.8 oz (68.4 kg)  ?12/16/21 149 lb (67.6 kg)  ?11/16/21 148 lb (67.1 kg)  ? ?Body mass index is 26.09 kg/m?. ? ?  ?Physical Exam ?HENT:  ?   Head: Normocephalic.  ?Musculoskeletal:     ?   General: No swelling or deformity.  ?   Right lower leg: No edema.  ?   Left lower leg: No edema.  ?   Comments: No lumbar spine tenderness.  Tenderness with palpation of right SI joint.  No lateral hip trochanter tenderness  ?Skin: ?   General: Skin is warm and dry.  ?Neurological:   ?   Mental Status: She is alert.  ?   Sensory: No sensory deficit.  ?   Motor: No weakness.  ?   Gait: Gait normal.  ? ?   ? ? ? ? ? ?Assessment & Plan:  ? ? ?See Problem List for Assessment and Plan of chronic medical problems.  ? ? ? ? ?

## 2022-01-03 ENCOUNTER — Other Ambulatory Visit: Payer: Self-pay | Admitting: Internal Medicine

## 2022-01-04 ENCOUNTER — Encounter: Payer: Self-pay | Admitting: Internal Medicine

## 2022-01-04 MED ORDER — WEGOVY 1 MG/0.5ML ~~LOC~~ SOAJ: 1.0000 mg | SUBCUTANEOUS | 0 refills | Status: DC

## 2022-01-04 NOTE — Progress Notes (Signed)
? ? Benito Mccreedy D.Merril Abbe ?[facility] ?[place] ?Phone: [phone] ?  ?Assessment and Plan:   ?  ?1. Muscle strain of gluteal region, initial encounter ?-Acute, uncomplicated, initial sports medicine visit ?- Suspect strain of right gluteal region based on HPI, physical exam, x-ray images ?- Start meloxicam 15 mg daily x2 weeks.  If still having pain after 2 weeks, complete 3rd-week of meloxicam. May use remaining meloxicam as needed once daily for pain control.  Do not to use additional NSAIDs while taking meloxicam.  May use Tylenol [phone] mg 2 to 3 times a day for breakthrough pain. ?- May increase chronic medication tizanidine to 4 to 8 mg 1-2 times a day ?- Start HEP ?- Reviewed x-ray images with patient in clinic.  My interpretation: No acute fracture or dislocation.  Patent SI joints bilaterally.  No significant degenerative changes in hips or L4-L5, creatinine from lab work on 08/27/2021 ?  ?Pertinent previous records reviewed include SI x-ray 12/31/2021, PCP note 12/31/21 ?  ?Follow Up: As needed if no improvement or worsening of symptoms in 3 to 4 weeks.  Could consider formal PT versus CSI ?  ?Subjective:   ?I, [name], am serving as a Education administrator for [employer] ? ?Chief Complaint: right sided hip pain  ? ?HPI:  ?01/05/2022 ?Patient is a 61 year old female complaining of right side hip pain . Patient states that her pain is in her hip started last week, she went to rollover in the bed it was really night the next night she had over 20 spasms it was really painful , no MOI had been pulling weeds and walking the dog didn't think anything of it , had a csi last PCP visit and has been taking meloxicam 3 days, pain when she sits and twisting and turning , no radiating pain, no back pain, has had xrays , SI joint pain that is now starting to go into the buttock  that started today, leaving tomorrow for vacation to [place]  ? ?Relevant  Historical Information: Hypertension, Raynaud's, GERD, IBS, fibromyalgia ? ?Additional pertinent review of systems negative. ? ? ?Current Outpatient Medications:  ?  Azelastine-Fluticasone 137-50 MCG/ACT SUSP, Place 2 sprays into both nostrils daily., Disp: 23 g, Rfl: 5 ?  Calcium Carbonate-Vitamin D 600-400 MG-UNIT tablet, Take 1 tablet by mouth daily., Disp: , Rfl:  ?  clotrimazole (CLOTRIMAZOLE ANTI-FUNGAL) 1 % cream, Apply 1 application topically 2 (two) times daily. (Patient taking differently: Apply 1 application. topically as needed.), Disp: 30 g, Rfl: 0 ?  diphenoxylate-atropine (LOMOTIL) 2.5-0.025 MG tablet, Take 1 tablet by mouth 4 (four) times daily as needed for diarrhea or loose stools., Disp: 30 tablet, Rfl: 0 ?  escitalopram (LEXAPRO) 20 MG tablet, TAKE 1 TABLET BY MOUTH ONCE DAILY, Disp: 30 tablet, Rfl: 5 ?  levothyroxine (SYNTHROID) 50 MCG tablet, Take 1 tablet (50 mcg total) by mouth daily., Disp: 90 tablet, Rfl: 3 ?  meloxicam (MOBIC) 15 MG tablet, Take 1 tablet (15 mg total) by mouth daily., Disp: 30 tablet, Rfl: 0 ?  nystatin (MYCOSTATIN/NYSTOP) powder, APPLY TOPICALLY TO AFFECTED AREA THREE TIMES DAILY., Disp: 60 g, Rfl: 3 ?  ondansetron (ZOFRAN) 4 MG tablet, Take 1 tablet (4 mg total) by mouth every 8 (eight) hours as needed for nausea or vomiting., Disp: 20 tablet, Rfl: 0 ?  rosuvastatin (CRESTOR) 20 MG tablet, TAKE ONE TABLET BY MOUTH DAILY, Disp: 30 tablet, Rfl: 1 ?  Semaglutide-Weight  Management (WEGOVY) 1 MG/0.5ML SOAJ, Inject 1 mg into the skin once a week., Disp: 2 mL, Rfl: 0 ?  SF 5000 PLUS 1.1 % CREA dental cream, Take 1 application by mouth at bedtime., Disp: , Rfl:  ?  tiZANidine (ZANAFLEX) 4 MG tablet, TAKE ONE TABLET BY MOUTH TWICE DAILY AS NEEDED FOR MUSCLE SPASMS, Disp: 60 tablet, Rfl: 2 ?  TYRVAYA 0.03 MG/ACT SOLN, SMARTSIG:1 Spray(s) Both Nares Twice a Week, Disp: , Rfl:  ?  UNIFINE PENTIPS 31G X 6 MM MISC, USE AS DIRECTED WITH SAXENDA, Disp: , Rfl:  ?  fluticasone (FLONASE)  50 MCG/ACT nasal spray, INSTILL 2 SPRAYS INTO BOTH NOSTRILS DAILY., Disp: 16 g, Rfl: 2  ? ?Objective:   ?  ?Vitals:  ? 01/05/22 1129  ?BP: 118/72  ?Pulse: 76  ?SpO2: 99%  ?Weight: 149 lb (67.6 kg)  ?Height: '5\' 3"'$  (1.6 m)  ?  ?  ?Body mass index is 26.39 kg/m?.  ?  ?Physical Exam:   ? ?Gen: Appears well, nad, nontoxic and pleasant ?Psych: Alert and oriented, appropriate mood and affect ?Neuro: sensation intact, strength is 5/5 in upper and lower extremities, muscle tone wnl ?Skin: no susupicious lesions or rashes ? ?Back - Normal skin, Spine with normal alignment and no deformity.   ?No tenderness to vertebral process palpation.   ?Paraspinous muscles are not tender and without spasm ?Straight leg raise negative, though reproduced pulling sensation in back did not radiate ?  ?Piriformis test positive on right, negative on left ?TTP gluteal musculature ? ? ?Electronically signed by:  ?Benito Mccreedy D.Merril Abbe ?[facility] ?11:59 AM 01/05/22 ?

## 2022-01-05 ENCOUNTER — Ambulatory Visit: Payer: BC Managed Care – PPO | Admitting: Sports Medicine

## 2022-01-05 VITALS — BP 118/72 | HR 76 | Ht 63.0 in | Wt 149.0 lb

## 2022-01-05 DIAGNOSIS — S76019A Strain of muscle, fascia and tendon of unspecified hip, initial encounter: Secondary | ICD-10-CM | POA: Diagnosis not present

## 2022-01-05 NOTE — Patient Instructions (Addendum)
Good to see you  ?Start meloxicam 15 mg daily x2 weeks.  If still having pain after 2 weeks, complete 3rd-week of meloxicam. May use remaining meloxicam as needed once daily for pain control.  Do not to use additional NSAIDs while taking meloxicam.  May use Tylenol [phone] mg 2 to 3 times a day for breakthrough pain. ?Can take tizanidine 4-8 mg 1-2 times a day  ?Glute piriformis low back HEP  ?As needed follow up  ? ? ?

## 2022-01-09 ENCOUNTER — Encounter: Payer: Self-pay | Admitting: Internal Medicine

## 2022-01-13 ENCOUNTER — Other Ambulatory Visit: Payer: BC Managed Care – PPO

## 2022-01-31 ENCOUNTER — Encounter: Payer: Self-pay | Admitting: Internal Medicine

## 2022-02-01 MED ORDER — WEGOVY 1 MG/0.5ML ~~LOC~~ SOAJ: 1.0000 mg | SUBCUTANEOUS | 0 refills | Status: DC

## 2022-02-01 NOTE — Addendum Note (Signed)
Addended by: Binnie Rail on: 02/01/2022 02:49 PM   Modules accepted: Orders

## 2022-02-03 ENCOUNTER — Other Ambulatory Visit: Payer: Self-pay | Admitting: Internal Medicine

## 2022-02-03 ENCOUNTER — Telehealth: Payer: Self-pay

## 2022-02-03 NOTE — Telephone Encounter (Signed)
Margaret Green (Key: [id number])  Your information has been submitted to [place]. [employer] [place] will review the request and notify you of the determination decision directly, typically within 72 hours of receiving all information.  You will also receive your request decision electronically. To check for an update later, open this request again from your dashboard.  If [employer] [place] has not responded within the specified timeframe or if you have any questions about your PA submission, contact [facility] directly at [phone]

## 2022-02-08 NOTE — Telephone Encounter (Signed)
Your prior authorization for Margaret Green has been approved! MORE INFO Personalized support and financial assistance may be available through the [employer] program. For more information, and to see program requirements, click on the More Info button to the right.  Message from plan: Effective from 02/03/2022 through 06/08/2022. Please note: This medication is to be titrated up in strength every 4 weeks as tolerated by the member. The quantity limit set of 4 pens per 180 days allows for this titration through all strengths using 4 pens of each strength every 28 days.

## 2022-02-11 MED ORDER — SEMAGLUTIDE-WEIGHT MANAGEMENT 1.7 MG/0.75ML ~~LOC~~ SOAJ: 1.7000 mg | SUBCUTANEOUS | 0 refills | Status: DC

## 2022-02-11 NOTE — Addendum Note (Signed)
Addended by: Binnie Rail on: 02/11/2022 08:05 AM   Modules accepted: Orders

## 2022-02-17 ENCOUNTER — Encounter: Payer: Self-pay | Admitting: Internal Medicine

## 2022-02-21 ENCOUNTER — Other Ambulatory Visit: Payer: Self-pay | Admitting: Internal Medicine

## 2022-02-22 MED ORDER — ONDANSETRON HCL 4 MG PO TABS: 4.0000 mg | ORAL_TABLET | Freq: Three times a day (TID) | ORAL | 0 refills | Status: DC | PRN

## 2022-03-02 ENCOUNTER — Ambulatory Visit: Payer: BC Managed Care – PPO | Admitting: Internal Medicine

## 2022-03-06 ENCOUNTER — Encounter: Payer: Self-pay | Admitting: Internal Medicine

## 2022-03-06 NOTE — Progress Notes (Signed)
Subjective:    Patient ID: Margaret Green, female    DOB: 05/30/1961, 61 y.o.   MRN: [id number]     HPI Margaret Green is here for follow up of her chronic medical problems, including htn, hld, hypothyroid, B12 def, D def, prediabetes, depression, anxiety    Medications and allergies reviewed with patient and updated if appropriate.  Current Outpatient Medications on File Prior to Visit  Medication Sig Dispense Refill   Azelastine-Fluticasone 137-50 MCG/ACT SUSP Place 2 sprays into both nostrils daily. 23 g 5   Calcium Carbonate-Vitamin D 600-400 MG-UNIT tablet Take 1 tablet by mouth daily.     clotrimazole (CLOTRIMAZOLE ANTI-FUNGAL) 1 % cream Apply 1 application topically 2 (two) times daily. (Patient taking differently: Apply 1 application. topically as needed.) 30 g 0   diphenoxylate-atropine (LOMOTIL) 2.5-0.025 MG tablet Take 1 tablet by mouth 4 (four) times daily as needed for diarrhea or loose stools. 30 tablet 0   escitalopram (LEXAPRO) 20 MG tablet TAKE 1 TABLET BY MOUTH ONCE DAILY 30 tablet 5   fluticasone (FLONASE) 50 MCG/ACT nasal spray INSTILL 2 SPRAYS INTO BOTH NOSTRILS DAILY. 16 g 2   levothyroxine (SYNTHROID) 50 MCG tablet Take 1 tablet (50 mcg total) by mouth daily. 90 tablet 3   meloxicam (MOBIC) 15 MG tablet Take 1 tablet (15 mg total) by mouth daily. 30 tablet 0   ondansetron (ZOFRAN) 4 MG tablet Take 1 tablet (4 mg total) by mouth every 8 (eight) hours as needed for nausea or vomiting. 20 tablet 0   rosuvastatin (CRESTOR) 20 MG tablet TAKE ONE TABLET BY MOUTH DAILY 30 tablet 1   Semaglutide-Weight Management 1.7 MG/0.75ML SOAJ Inject 1.7 mg into the skin once a week. 6 mL 0   SF 5000 PLUS 1.1 % CREA dental cream Take 1 application by mouth at bedtime.     tiZANidine (ZANAFLEX) 4 MG tablet TAKE ONE TABLET BY MOUTH TWICE DAILY AS NEEDED FOR MUSCLE SPASMS 60 tablet 2   TYRVAYA 0.03 MG/ACT SOLN SMARTSIG:1 Spray(s) Both Nares Twice a Week     UNIFINE PENTIPS 31G X  6 MM MISC USE AS DIRECTED WITH SAXENDA     No current facility-administered medications on file prior to visit.     Review of Systems     Objective:  There were no vitals filed for this visit. BP Readings from Last 3 Encounters:  01/05/22 118/72  12/31/21 130/80  12/16/21 (!) 162/98   Wt Readings from Last 3 Encounters:  01/05/22 149 lb (67.6 kg)  12/31/21 150 lb 12.8 oz (68.4 kg)  12/16/21 149 lb (67.6 kg)   There is no height or weight on file to calculate BMI.    Physical Exam     Lab Results  Component Value Date   WBC 4.9 08/27/2021   HGB 14.4 08/27/2021   HCT 44.3 08/27/2021   PLT 258 08/27/2021   GLUCOSE 86 08/27/2021   CHOL 153 08/27/2021   TRIG 105 08/27/2021   HDL 43 (L) 08/27/2021   LDLDIRECT 103.0 02/05/2019   LDLCALC 90 08/27/2021   ALT 23 08/27/2021   AST 22 08/27/2021   NA 140 08/27/2021   K 4.2 08/27/2021   CL 103 08/27/2021   CREATININE 0.76 08/27/2021   BUN 8 08/27/2021   CO2 29 08/27/2021   TSH 1.96 12/16/2021   HGBA1C 5.4 08/27/2021     Assessment & Plan:    See Problem List for Assessment and Plan of chronic medical problems.  This encounter was created in error - please disregard.

## 2022-03-07 ENCOUNTER — Encounter: Payer: BC Managed Care – PPO | Admitting: Internal Medicine

## 2022-03-07 DIAGNOSIS — E559 Vitamin D deficiency, unspecified: Secondary | ICD-10-CM

## 2022-03-07 DIAGNOSIS — E039 Hypothyroidism, unspecified: Secondary | ICD-10-CM

## 2022-03-07 DIAGNOSIS — F3289 Other specified depressive episodes: Secondary | ICD-10-CM

## 2022-03-07 DIAGNOSIS — E7849 Other hyperlipidemia: Secondary | ICD-10-CM

## 2022-03-07 DIAGNOSIS — E538 Deficiency of other specified B group vitamins: Secondary | ICD-10-CM

## 2022-03-07 DIAGNOSIS — I10 Essential (primary) hypertension: Secondary | ICD-10-CM

## 2022-03-07 DIAGNOSIS — F411 Generalized anxiety disorder: Secondary | ICD-10-CM

## 2022-03-07 DIAGNOSIS — R7303 Prediabetes: Secondary | ICD-10-CM

## 2022-03-08 ENCOUNTER — Encounter: Payer: Self-pay | Admitting: Internal Medicine

## 2022-03-08 NOTE — Progress Notes (Signed)
Subjective:    Patient ID: Margaret Green, female    DOB: [birth year], 61 y.o.   MRN: [id number]     HPI Margaret Green is here for follow up of her chronic medical problems, including htn, hld, hypothyroid, B12 def, D def, prediabetes, depression, anxiety    Medications and allergies reviewed with patient and updated if appropriate.  Current Outpatient Medications on File Prior to Visit  Medication Sig Dispense Refill   Azelastine-Fluticasone 137-50 MCG/ACT SUSP Place 2 sprays into both nostrils daily. 23 g 5   Calcium Carbonate-Vitamin D 600-400 MG-UNIT tablet Take 1 tablet by mouth daily.     clotrimazole (CLOTRIMAZOLE ANTI-FUNGAL) 1 % cream Apply 1 application topically 2 (two) times daily. (Patient taking differently: Apply 1 application. topically as needed.) 30 g 0   diphenoxylate-atropine (LOMOTIL) 2.5-0.025 MG tablet Take 1 tablet by mouth 4 (four) times daily as needed for diarrhea or loose stools. 30 tablet 0   escitalopram (LEXAPRO) 20 MG tablet TAKE 1 TABLET BY MOUTH ONCE DAILY 30 tablet 5   fluticasone (FLONASE) 50 MCG/ACT nasal spray INSTILL 2 SPRAYS INTO BOTH NOSTRILS DAILY. 16 g 2   levothyroxine (SYNTHROID) 50 MCG tablet Take 1 tablet (50 mcg total) by mouth daily. 90 tablet 3   meloxicam (MOBIC) 15 MG tablet Take 1 tablet (15 mg total) by mouth daily. 30 tablet 0   ondansetron (ZOFRAN) 4 MG tablet Take 1 tablet (4 mg total) by mouth every 8 (eight) hours as needed for nausea or vomiting. 20 tablet 0   rosuvastatin (CRESTOR) 20 MG tablet TAKE ONE TABLET BY MOUTH DAILY 30 tablet 1   Semaglutide-Weight Management 1.7 MG/0.75ML SOAJ Inject 1.7 mg into the skin once a week. 6 mL 0   SF 5000 PLUS 1.1 % CREA dental cream Take 1 application by mouth at bedtime.     tiZANidine (ZANAFLEX) 4 MG tablet TAKE ONE TABLET BY MOUTH TWICE DAILY AS NEEDED FOR MUSCLE SPASMS 60 tablet 2   TYRVAYA 0.03 MG/ACT SOLN SMARTSIG:1 Spray(s) Both Nares Twice a Week     UNIFINE PENTIPS 31G X  6 MM MISC USE AS DIRECTED WITH SAXENDA     No current facility-administered medications on file prior to visit.     Review of Systems     Objective:  There were no vitals filed for this visit. BP Readings from Last 3 Encounters:  01/05/22 118/72  12/31/21 130/80  12/16/21 (!) 162/98   Wt Readings from Last 3 Encounters:  01/05/22 149 lb (67.6 kg)  12/31/21 150 lb 12.8 oz (68.4 kg)  12/16/21 149 lb (67.6 kg)   There is no height or weight on file to calculate BMI.    Physical Exam     Lab Results  Component Value Date   WBC 4.9 08/27/2021   HGB 14.4 08/27/2021   HCT 44.3 08/27/2021   PLT 258 08/27/2021   GLUCOSE 86 08/27/2021   CHOL 153 08/27/2021   TRIG 105 08/27/2021   HDL 43 (L) 08/27/2021   LDLDIRECT 103.0 02/05/2019   LDLCALC 90 08/27/2021   ALT 23 08/27/2021   AST 22 08/27/2021   NA 140 08/27/2021   K 4.2 08/27/2021   CL 103 08/27/2021   CREATININE 0.76 08/27/2021   BUN 8 08/27/2021   CO2 29 08/27/2021   TSH 1.96 12/16/2021   HGBA1C 5.4 08/27/2021     Assessment & Plan:    See Problem List for Assessment and Plan of chronic medical problems.  This encounter was created in error - please disregard.

## 2022-03-08 NOTE — Patient Instructions (Addendum)
     Blood work was ordered.     Medications changes include :   None   Your prescription(s) have been sent to your pharmacy.    A referral was ordered for XX.     Someone from that office will call you to schedule an appointment.    Return in about 6 months (around 09/08/2022) for Physical Exam.

## 2022-03-09 ENCOUNTER — Encounter: Payer: BC Managed Care – PPO | Admitting: Internal Medicine

## 2022-03-09 DIAGNOSIS — E7849 Other hyperlipidemia: Secondary | ICD-10-CM

## 2022-03-09 DIAGNOSIS — F411 Generalized anxiety disorder: Secondary | ICD-10-CM

## 2022-03-09 DIAGNOSIS — E538 Deficiency of other specified B group vitamins: Secondary | ICD-10-CM

## 2022-03-09 DIAGNOSIS — I10 Essential (primary) hypertension: Secondary | ICD-10-CM

## 2022-03-09 DIAGNOSIS — R7303 Prediabetes: Secondary | ICD-10-CM

## 2022-03-09 DIAGNOSIS — F3289 Other specified depressive episodes: Secondary | ICD-10-CM

## 2022-03-09 DIAGNOSIS — E559 Vitamin D deficiency, unspecified: Secondary | ICD-10-CM

## 2022-03-09 DIAGNOSIS — E039 Hypothyroidism, unspecified: Secondary | ICD-10-CM

## 2022-03-09 NOTE — Assessment & Plan Note (Signed)
Chronic Blood pressure well controlled Was on medication but we stopped this almost 4 months ago Monitor off medication

## 2022-03-09 NOTE — Assessment & Plan Note (Signed)
Chronic Check a1c Low sugar / carb diet Stressed regular exercise  

## 2022-03-09 NOTE — Assessment & Plan Note (Signed)
Chronic Taking vitamin D daily Check vitamin D level  

## 2022-03-09 NOTE — Assessment & Plan Note (Signed)
Chronic Taking B12 daily Check B12 level 

## 2022-03-09 NOTE — Assessment & Plan Note (Signed)
Chronic  Clinically euthyroid Currently taking levothyroxine 50 mcg daily Check tsh  Titrate med dose if needed  

## 2022-03-09 NOTE — Assessment & Plan Note (Signed)
Chronic ?Controlled, Stable ?Continue Lexapro 20 mg daily ?

## 2022-03-09 NOTE — Assessment & Plan Note (Signed)
Chronic Regular exercise and healthy diet encouraged Check lipid panel  Continue Crestor 20 mg daily 

## 2022-03-14 ENCOUNTER — Encounter: Payer: Self-pay | Admitting: Internal Medicine

## 2022-03-16 ENCOUNTER — Other Ambulatory Visit: Payer: Self-pay | Admitting: Internal Medicine

## 2022-03-16 ENCOUNTER — Telehealth: Payer: Self-pay | Admitting: *Deleted

## 2022-03-16 DIAGNOSIS — G43011 Migraine without aura, intractable, with status migrainosus: Secondary | ICD-10-CM | POA: Insufficient documentation

## 2022-03-16 MED ORDER — NURTEC 75 MG PO TBDP: 1.0000 | ORAL_TABLET | Freq: Every day | ORAL | 3 refills | Status: DC | PRN

## 2022-03-16 NOTE — Telephone Encounter (Signed)
Pt was on cover-my- meds.. Need PA on Nurtec 75 mg. Submitted w/ Key: [id number]. Waiting on response from insurance.Marland KitchenJohny Chess

## 2022-03-18 NOTE — Telephone Encounter (Signed)
Rec'd determination fax MED was APPROVED. It states This request has received a Favorable outcome from [place]. Effective from 03/16/2022 through 06/07/2022. Faxing approval to pof../lm,b

## 2022-04-04 NOTE — Progress Notes (Unsigned)
Subjective:    Patient ID: Margaret Green, female    DOB: 12-03-1960, 61 y.o.   MRN: [id number]     HPI Shuronda is here for follow up of her chronic medical problems, including htn, hld, hypothyroid, B12 def, D def, prediabetes, depression, anxiety  Wegovy - taking it once every 3 weeks.    Exercising regularly-golfing, bike riding  She has noticed few weeks ago some redness on her lower back and wonders if it is from using the heating pad when she had injured her back.  She denies any pain.  Medications and allergies reviewed with patient and updated if appropriate.  Current Outpatient Medications on File Prior to Visit  Medication Sig Dispense Refill   Azelastine-Fluticasone 137-50 MCG/ACT SUSP Place 2 sprays into both nostrils daily. 23 g 5   Calcium Carbonate-Vitamin D 600-400 MG-UNIT tablet Take 1 tablet by mouth daily.     clotrimazole (CLOTRIMAZOLE ANTI-FUNGAL) 1 % cream Apply 1 application topically 2 (two) times daily. (Patient taking differently: Apply 1 application  topically as needed.) 30 g 0   diphenoxylate-atropine (LOMOTIL) 2.5-0.025 MG tablet Take 1 tablet by mouth 4 (four) times daily as needed for diarrhea or loose stools. 30 tablet 0   escitalopram (LEXAPRO) 20 MG tablet TAKE 1 TABLET BY MOUTH ONCE DAILY 30 tablet 5   levothyroxine (SYNTHROID) 50 MCG tablet Take 1 tablet (50 mcg total) by mouth daily. 90 tablet 3   ondansetron (ZOFRAN) 4 MG tablet Take 1 tablet (4 mg total) by mouth every 8 (eight) hours as needed for nausea or vomiting. 20 tablet 0   Rimegepant Sulfate (NURTEC) 75 MG TBDP Take 1 tablet by mouth daily as needed. 8 tablet 3   rosuvastatin (CRESTOR) 20 MG tablet TAKE ONE TABLET BY MOUTH DAILY 30 tablet 1   Semaglutide-Weight Management 1.7 MG/0.75ML SOAJ Inject 1.7 mg into the skin once a week. 6 mL 0   SF 5000 PLUS 1.1 % CREA dental cream Take 1 application by mouth at bedtime.     tiZANidine (ZANAFLEX) 4 MG tablet TAKE ONE TABLET BY  MOUTH TWICE DAILY AS NEEDED FOR MUSCLE SPASMS 60 tablet 2   TYRVAYA 0.03 MG/ACT SOLN SMARTSIG:1 Spray(s) Both Nares Twice a Week     UNIFINE PENTIPS 31G X 6 MM MISC USE AS DIRECTED WITH SAXENDA     fluticasone (FLONASE) 50 MCG/ACT nasal spray INSTILL 2 SPRAYS INTO BOTH NOSTRILS DAILY. 16 g 2   No current facility-administered medications on file prior to visit.     Review of Systems  Constitutional:  Negative for fever.  Respiratory:  Negative for cough, shortness of breath and wheezing.   Cardiovascular:  Negative for chest pain, palpitations and leg swelling.  Musculoskeletal:  Positive for arthralgias.  Neurological:  Positive for dizziness and headaches (rare migraines). Negative for light-headedness.       Objective:   Vitals:   04/05/22 1424  BP: 132/80  Pulse: 63  Temp: 98 F (36.7 C)  SpO2: 99%   BP Readings from Last 3 Encounters:  04/05/22 132/80  01/05/22 118/72  12/31/21 130/80   Wt Readings from Last 3 Encounters:  04/05/22 148 lb 9.6 oz (67.4 kg)  01/05/22 149 lb (67.6 kg)  12/31/21 150 lb 12.8 oz (68.4 kg)   Body mass index is 26.32 kg/m.    Physical Exam Constitutional:      General: She is not in acute distress.    Appearance: Normal appearance.  HENT:  Head: Normocephalic and atraumatic.  Eyes:     Conjunctiva/sclera: Conjunctivae normal.  Cardiovascular:     Rate and Rhythm: Normal rate and regular rhythm.     Heart sounds: Normal heart sounds. No murmur heard. Pulmonary:     Effort: Pulmonary effort is normal. No respiratory distress.     Breath sounds: Normal breath sounds. No wheezing.  Musculoskeletal:     Cervical back: Neck supple.     Right lower leg: No edema.     Left lower leg: No edema.  Lymphadenopathy:     Cervical: No cervical adenopathy.  Skin:    General: Skin is warm and dry.     Findings: No rash.  Neurological:     Mental Status: She is alert. Mental status is at baseline.  Psychiatric:        Mood and  Affect: Mood normal.        Behavior: Behavior normal.        Lab Results  Component Value Date   WBC 4.9 08/27/2021   HGB 14.4 08/27/2021   HCT 44.3 08/27/2021   PLT 258 08/27/2021   GLUCOSE 86 08/27/2021   CHOL 153 08/27/2021   TRIG 105 08/27/2021   HDL 43 (L) 08/27/2021   LDLDIRECT 103.0 02/05/2019   LDLCALC 90 08/27/2021   ALT 23 08/27/2021   AST 22 08/27/2021   NA 140 08/27/2021   K 4.2 08/27/2021   CL 103 08/27/2021   CREATININE 0.76 08/27/2021   BUN 8 08/27/2021   CO2 29 08/27/2021   TSH 1.96 12/16/2021   HGBA1C 5.4 08/27/2021     Assessment & Plan:    See Problem List for Assessment and Plan of chronic medical problems.

## 2022-04-05 ENCOUNTER — Encounter: Payer: Self-pay | Admitting: Internal Medicine

## 2022-04-05 ENCOUNTER — Ambulatory Visit: Payer: BC Managed Care – PPO | Admitting: Internal Medicine

## 2022-04-05 VITALS — BP 132/80 | HR 63 | Temp 98.0°F | Ht 63.0 in | Wt 148.6 lb

## 2022-04-05 DIAGNOSIS — E039 Hypothyroidism, unspecified: Secondary | ICD-10-CM

## 2022-04-05 DIAGNOSIS — L819 Disorder of pigmentation, unspecified: Secondary | ICD-10-CM

## 2022-04-05 DIAGNOSIS — R7303 Prediabetes: Secondary | ICD-10-CM | POA: Diagnosis not present

## 2022-04-05 DIAGNOSIS — F3289 Other specified depressive episodes: Secondary | ICD-10-CM

## 2022-04-05 DIAGNOSIS — M3500 Sicca syndrome, unspecified: Secondary | ICD-10-CM

## 2022-04-05 DIAGNOSIS — E7849 Other hyperlipidemia: Secondary | ICD-10-CM

## 2022-04-05 DIAGNOSIS — E559 Vitamin D deficiency, unspecified: Secondary | ICD-10-CM | POA: Diagnosis not present

## 2022-04-05 DIAGNOSIS — F411 Generalized anxiety disorder: Secondary | ICD-10-CM

## 2022-04-05 DIAGNOSIS — G43109 Migraine with aura, not intractable, without status migrainosus: Secondary | ICD-10-CM

## 2022-04-05 DIAGNOSIS — I890 Lymphedema, not elsewhere classified: Secondary | ICD-10-CM | POA: Insufficient documentation

## 2022-04-05 DIAGNOSIS — E538 Deficiency of other specified B group vitamins: Secondary | ICD-10-CM

## 2022-04-05 DIAGNOSIS — I10 Essential (primary) hypertension: Secondary | ICD-10-CM

## 2022-04-05 LAB — HEMOGLOBIN A1C: Hgb A1c MFr Bld: 5.7 % (ref 4.6–6.5)

## 2022-04-05 LAB — COMPREHENSIVE METABOLIC PANEL
ALT: 18 U/L (ref 0–35)
AST: 20 U/L (ref 0–37)
Albumin: 4.2 g/dL (ref 3.5–5.2)
Alkaline Phosphatase: 75 U/L (ref 39–117)
BUN: 12 mg/dL (ref 6–23)
CO2: 32 mEq/L (ref 19–32)
Calcium: 9.6 mg/dL (ref 8.4–10.5)
Chloride: 102 mEq/L (ref 96–112)
Creatinine, Ser: 0.82 mg/dL (ref 0.40–1.20)
GFR: 77.85 mL/min (ref >60.00)
Glucose, Bld: 86 mg/dL (ref 70–99)
Potassium: 4.6 mEq/L (ref 3.5–5.1)
Sodium: 139 mEq/L (ref 135–145)
Total Bilirubin: 0.4 mg/dL (ref 0.2–1.2)
Total Protein: 7.5 g/dL (ref 6.0–8.3)

## 2022-04-05 LAB — LIPID PANEL
Cholesterol: 171 mg/dL (ref 0–200)
HDL: 51 mg/dL (ref >39.00)
LDL Cholesterol: 100 mg/dL — ABNORMAL HIGH (ref 0–99)
NonHDL: 120.33
Total CHOL/HDL Ratio: 3
Triglycerides: 100 mg/dL (ref 0.0–149.0)
VLDL: 20 mg/dL (ref 0.0–40.0)

## 2022-04-05 LAB — VITAMIN B12: Vitamin B-12: 417 pg/mL (ref 211–911)

## 2022-04-05 LAB — TSH: TSH: 3.56 u[IU]/mL (ref 0.35–5.50)

## 2022-04-05 LAB — VITAMIN D 25 HYDROXY (VIT D DEFICIENCY, FRACTURES): VITD: 35.28 ng/mL (ref 30.00–100.00)

## 2022-04-05 NOTE — Assessment & Plan Note (Signed)
Chronic Taking vitamin D daily Check vitamin D level  

## 2022-04-05 NOTE — Assessment & Plan Note (Signed)
Chronic Check a1c Has lost weight and now maintaining weight Low sugar / carb diet Continue regular exercise

## 2022-04-05 NOTE — Assessment & Plan Note (Signed)
New Bilateral lower back with some mild lymphedema where she has multiple skin-likely related to thinking had She denies any pain in the area is nontender ?  How long its been there-at least several weeks Can consider lymphatic PT We will check basic blood work

## 2022-04-05 NOTE — Assessment & Plan Note (Signed)
Chronic Taking B12 Check B12 level 

## 2022-04-05 NOTE — Assessment & Plan Note (Signed)
Chronic Rare migraines Given week she did have a migraine related to weather changes She would like to have something on hand just in case-the pharmacy did not have the Nurtec that was sent in for her couple of weeks ago Samples given of Nurtec and Ubrelvy to try

## 2022-04-05 NOTE — Assessment & Plan Note (Signed)
Chronic Not on any medication

## 2022-04-05 NOTE — Assessment & Plan Note (Signed)
Chronic  Clinically euthyroid Currently taking levothyroxine 50 mcg daily Check tsh  Titrate med dose if needed  

## 2022-04-05 NOTE — Assessment & Plan Note (Signed)
Chronic ?Controlled, Stable ?Continue Lexapro 20 mg daily ?

## 2022-04-05 NOTE — Assessment & Plan Note (Signed)
New Bilateral lower back Likely related to heating pad Also associated with some mild swelling-lymphedema

## 2022-04-05 NOTE — Assessment & Plan Note (Signed)
Chronic Regular exercise and healthy diet encouraged Check lipid panel  Continue Crestor 20 mg daily 

## 2022-04-05 NOTE — Patient Instructions (Addendum)
     Blood work was ordered.     Medications changes include :   migraine medication  Roselyn Meier - take one - may repeat after 2 hrs if needed  Nurtec - take one   Your prescription(s) have been sent to your pharmacy.     Return in about 6 weeks (around 05/17/2022) for follow up back.

## 2022-04-11 ENCOUNTER — Other Ambulatory Visit: Payer: Self-pay

## 2022-04-11 ENCOUNTER — Encounter: Payer: Self-pay | Admitting: Internal Medicine

## 2022-04-12 MED ORDER — SEMAGLUTIDE-WEIGHT MANAGEMENT 2.4 MG/0.75ML ~~LOC~~ SOAJ: 2.4000 mg | SUBCUTANEOUS | 0 refills | Status: DC

## 2022-04-12 NOTE — Addendum Note (Signed)
Addended by: Earnstine Regal on: 04/12/2022 09:36 AM   Modules accepted: Orders

## 2022-04-14 ENCOUNTER — Other Ambulatory Visit (HOSPITAL_COMMUNITY): Payer: Self-pay

## 2022-04-14 MED ORDER — AMOXICILLIN-POT CLAVULANATE 875-125 MG PO TABS
1.0000 | ORAL_TABLET | Freq: Two times a day (BID) | ORAL | 0 refills | Status: DC
  Filled 2022-04-14: qty 20, 10d supply, fill #0

## 2022-04-14 MED ORDER — LIDOCAINE VISCOUS HCL 2 % MT SOLN
5.0000 mL | OROMUCOSAL | 1 refills | Status: DC
  Filled 2022-04-14: qty 200, 13d supply, fill #0

## 2022-04-15 ENCOUNTER — Other Ambulatory Visit (HOSPITAL_COMMUNITY): Payer: Self-pay

## 2022-04-15 MED ORDER — HYDROCODONE-ACETAMINOPHEN 10-325 MG PO TABS
0.5000 | ORAL_TABLET | Freq: Four times a day (QID) | ORAL | 0 refills | Status: DC | PRN
  Filled 2022-04-15: qty 4, 1d supply, fill #0

## 2022-04-21 ENCOUNTER — Other Ambulatory Visit: Payer: Self-pay | Admitting: Internal Medicine

## 2022-05-05 ENCOUNTER — Encounter: Payer: Self-pay | Admitting: Internal Medicine

## 2022-05-09 ENCOUNTER — Other Ambulatory Visit: Payer: Self-pay | Admitting: Internal Medicine

## 2022-05-17 ENCOUNTER — Ambulatory Visit: Payer: BC Managed Care – PPO | Admitting: Internal Medicine

## 2022-06-06 ENCOUNTER — Other Ambulatory Visit: Payer: Self-pay | Admitting: Internal Medicine

## 2022-06-27 ENCOUNTER — Telehealth: Payer: Self-pay

## 2022-06-27 ENCOUNTER — Encounter: Payer: Self-pay | Admitting: Internal Medicine

## 2022-06-27 NOTE — Telephone Encounter (Signed)
Margaret D. Is calling in stating an urgent prior auth was submitted for Rimegepant Sulfate (NURTEC) 75 MG TBDP, they are missing information. Will be faxing [place] this as well.

## 2022-06-29 NOTE — Telephone Encounter (Signed)
Margaret Green is calling in from [place], asking to speak with someone about this.

## 2022-06-30 MED ORDER — WEGOVY 1.7 MG/0.75ML ~~LOC~~ SOAJ
1.7000 mg | SUBCUTANEOUS | 1 refills | Status: DC
Start: 2022-06-30 — End: 2023-02-23

## 2022-06-30 NOTE — Telephone Encounter (Signed)
Key: [id number]  PA started on line today.

## 2022-07-12 ENCOUNTER — Other Ambulatory Visit: Payer: Self-pay | Admitting: Internal Medicine

## 2022-07-20 NOTE — Telephone Encounter (Signed)
Patient is not taking Botox now.  [employer] advises that the Nurtec will be covered if patient is not taking botox.  This information needs to be faxed to [phone] to [facility].

## 2022-07-25 ENCOUNTER — Encounter: Payer: Self-pay | Admitting: Internal Medicine

## 2022-07-29 NOTE — Telephone Encounter (Signed)
Notes were faxed on Wednesday.

## 2022-08-07 ENCOUNTER — Encounter: Payer: Self-pay | Admitting: Internal Medicine

## 2022-08-07 NOTE — Progress Notes (Signed)
Subjective:    Patient ID: Margaret Green, female    DOB: 26-Aug-1961, 61 y.o.   MRN: [id number]      HPI Zorana is here for  Chief Complaint  Patient presents with   Ankle Pain     Gout? - went to urgent care 11/6 for left ankle pain.  She woke up 1 morning with pain.  She denies any injuries.  The day before she did play golf, but does that frequently and does not recall anything out of the ordinary.  Her left lateral ankle was swollen and painful.  She was not able to bear weight.   She took colchicine.  She later took the prescribed steroids, which did not help.  She is currently taking ibuprofen.  That does help.    Swelling goes down at night and gets swollen throughout the day.  Tenderness around lateral malleolus and pain radiates up to 1/3 way up lower leg.  Pain with inversion    Medications and allergies reviewed with patient and updated if appropriate.  Current Outpatient Medications on File Prior to Visit  Medication Sig Dispense Refill   Azelastine-Fluticasone 137-50 MCG/ACT SUSP Place 2 sprays into both nostrils daily. 23 g 5   Calcium Carb-Cholecalciferol 600-10 MG-MCG TABS Take 1 tablet by mouth daily.     clotrimazole (CLOTRIMAZOLE ANTI-FUNGAL) 1 % cream Apply 1 application topically 2 (two) times daily. (Patient taking differently: Apply 1 application  topically as needed.) 30 g 0   diphenoxylate-atropine (LOMOTIL) 2.5-0.025 MG tablet Take 1 tablet by mouth 4 (four) times daily as needed for diarrhea or loose stools. 30 tablet 0   escitalopram (LEXAPRO) 20 MG tablet TAKE ONE TABLET BY MOUTH ONCE DAILY 30 tablet 5   levothyroxine (SYNTHROID) 50 MCG tablet Take 1 tablet (50 mcg total) by mouth daily. 90 tablet 3   ondansetron (ZOFRAN) 4 MG tablet Take 1 tablet (4 mg total) by mouth every 8 (eight) hours as needed for nausea or vomiting. 20 tablet 0   Rimegepant Sulfate (NURTEC) 75 MG TBDP Take 1 tablet by mouth daily as needed. 8 tablet 3    rosuvastatin (CRESTOR) 20 MG tablet TAKE ONE TABLET BY MOUTH DAILY 30 tablet 1   Semaglutide-Weight Management (WEGOVY) 1.7 MG/0.75ML SOAJ Inject 1.7 mg into the skin once a week. 9 mL 1   SF 5000 PLUS 1.1 % CREA dental cream Take 1 application by mouth at bedtime.     tiZANidine (ZANAFLEX) 4 MG tablet TAKE ONE TABLET BY MOUTH TWICE DAILY AS NEEDED FOR MUSCLE SPASMS 60 tablet 2   TYRVAYA 0.03 MG/ACT SOLN SMARTSIG:1 Spray(s) Both Nares Twice a Week     UNIFINE PENTIPS 31G X 6 MM MISC USE AS DIRECTED WITH SAXENDA     fluticasone (FLONASE) 50 MCG/ACT nasal spray INSTILL 2 SPRAYS INTO BOTH NOSTRILS DAILY. 16 g 2   No current facility-administered medications on file prior to visit.    Review of Systems     Objective:   Vitals:   08/08/22 0844  BP: 132/74  Pulse: 70  Temp: 98.2 F (36.8 C)  SpO2: 98%   BP Readings from Last 3 Encounters:  08/08/22 132/74  04/05/22 132/80  01/05/22 118/72   Wt Readings from Last 3 Encounters:  08/08/22 154 lb (69.9 kg)  04/05/22 148 lb 9.6 oz (67.4 kg)  01/05/22 149 lb (67.6 kg)   Body mass index is 27.28 kg/m.    Physical Exam Constitutional:  General: She is not in acute distress.    Appearance: Normal appearance. She is not ill-appearing.  HENT:     Head: Normocephalic and atraumatic.  Musculoskeletal:        General: Tenderness (Tenderness around lateral malleolus and radiating up 1/3 lateral lower leg) present. No swelling.  Skin:    General: Skin is warm and dry.     Findings: No bruising, erythema, lesion or rash.  Neurological:     Mental Status: She is alert.            Assessment & Plan:    See Problem List for Assessment and Plan of chronic medical problems.

## 2022-08-08 ENCOUNTER — Ambulatory Visit: Payer: BC Managed Care – PPO | Admitting: Internal Medicine

## 2022-08-08 ENCOUNTER — Ambulatory Visit: Payer: BC Managed Care – PPO | Admitting: Sports Medicine

## 2022-08-08 ENCOUNTER — Ambulatory Visit (INDEPENDENT_AMBULATORY_CARE_PROVIDER_SITE_OTHER): Payer: BC Managed Care – PPO

## 2022-08-08 VITALS — BP 132/74 | HR 70 | Temp 98.2°F | Ht 63.0 in | Wt 154.0 lb

## 2022-08-08 VITALS — BP 132/74 | Ht 63.0 in | Wt 154.0 lb

## 2022-08-08 DIAGNOSIS — M25572 Pain in left ankle and joints of left foot: Secondary | ICD-10-CM | POA: Insufficient documentation

## 2022-08-08 MED ORDER — MELOXICAM 15 MG PO TABS: 15.0000 mg | ORAL_TABLET | Freq: Every day | ORAL | 0 refills | Status: DC

## 2022-08-08 MED ORDER — IBUPROFEN 600 MG PO TABS: 600.0000 mg | ORAL_TABLET | Freq: Three times a day (TID) | ORAL | 0 refills | Status: AC | PRN

## 2022-08-08 NOTE — Patient Instructions (Addendum)
Good to see you  - Start meloxicam 15 mg daily x2 weeks.  If still having pain after 2 weeks, complete 3rd-week of meloxicam. May use remaining meloxicam as needed once daily for pain control.  Do not to use additional NSAIDs while taking meloxicam.  May use Tylenol [phone] mg 2 to 3 times a day for breakthrough pain. Gradually come out of boot  Ankle HEP 3 week follow up

## 2022-08-08 NOTE — Patient Instructions (Addendum)
    Make an appointment with sports medicine downstairs for your lateral left ankle pain.   Medications changes include :   ibuprofen 600 mg three times a day with food as needed    A referral was ordered for sports medicine.     Someone will call you to schedule an appointment.

## 2022-08-08 NOTE — Assessment & Plan Note (Signed)
Acute Started 1 morning-woke up with at the beginning of November without obvious injury, but did play golf the day before Pain initially thought to be gout when she went to urgent care 11/6-no improvement with colchicine or prednisone Pain and swelling has persisted-swelling accumulates throughout the day and is prolonged morning Pain lateral malleolus radiating one third way up lower leg Likely tendinitis Ibuprofen 600 mg 3 times daily as needed with food Referral to sports medicine for confirmation and further treatment Rest, ice, elevate, ibuprofen as needed and can consider brace

## 2022-08-08 NOTE — Progress Notes (Signed)
Margaret Green D.[place] Phone: [phone]   Assessment and Plan:     1. Acute left ankle pain -Acute, minimal improvement, initial sports medicine visit - Left lateral ankle pain over distal one third of fibula most consistent with peroneal tendon strain based on HPI and physical exam.  Differential includes fibula stress reaction, though no cortical changes seen on x-ray today that would be consistent with stress fracture.  Presentation is not consistent with gout based on patient having no history of gout, no erythema or warmth to joints, uric acid level WNL at time of most severe pain, and failure to improve with use of colchicine and prednisone  - Start meloxicam 15 mg daily x2 weeks.  If still having pain after 2 weeks, complete 3rd-week of meloxicam. May use remaining meloxicam as needed once daily for pain control.  Do not to use additional NSAIDs while taking meloxicam.  May use Tylenol [phone] mg 2 to 3 times a day for breakthrough pain. - Recommend boot use for the next 2 to 3 weeks.  May gradually come out a boot at home after 2 weeks.  Goal of pain-free ambulation - X-ray obtained in clinic.  My interpretation: No acute fracture or dislocation. - Start HEP for ankle - DG Ankle Complete Left; Future    Pertinent previous records reviewed include family medicine note 08/08/2022   Follow Up: 3 weeks for reevaluation.  Could consider ultrasound if no improvement or worsening of symptoms.  Could use turning fork to rule out stress reaction or fracture.   Subjective:   I, Margaret Green, am serving as a Education administrator for Doctor [name]  Chief Complaint: left ankle pain   HPI:   08/08/22 Patient is a 61 year old female complaining of left ankle pain. Patient states that she was at the beach over the weekend and consumed a lot of shellfish which she does not normally do and her symptoms started. She  reports the area has been swollen and a bit tender and somewhat painful to weight-bear on. She denies any prior injury, trauma, surgery to the ankle. She denies any mis-stepping or impact activities. Patient denies fevers, chills, chest pain or shortness of breath, redness about the joint, streaking, pus or drainage/open wounds about the joint. Patient has been taking ibuprofen but this provides little relief for her.   went to urgent care 11/6 - 3-4 days prior woke up with left ankle pain, swelling w/o injury.  Had pain on lateral ankle. Unable to bear weight.    She took colchicine.  Steroids did not help.     Swelling goes down at night and gets swollen throughout the day.  Tenderness around lateral malleolus and pain radiates up to 1/3 way up lower leg.      Relevant Historical Information: Raynaud's, GERD, hypothyroidism, fibromyalgia, prediabetes  Additional pertinent review of systems negative.   Current Outpatient Medications:    meloxicam (MOBIC) 15 MG tablet, Take 1 tablet (15 mg total) by mouth daily., Disp: 30 tablet, Rfl: 0   Azelastine-Fluticasone 137-50 MCG/ACT SUSP, Place 2 sprays into both nostrils daily., Disp: 23 g, Rfl: 5   Calcium Carb-Cholecalciferol 600-10 MG-MCG TABS, Take 1 tablet by mouth daily., Disp: , Rfl:    clotrimazole (CLOTRIMAZOLE ANTI-FUNGAL) 1 % cream, Apply 1 application topically 2 (two) times daily. (Patient taking differently: Apply 1 application  topically as needed.), Disp: 30 g, Rfl: 0   diphenoxylate-atropine (  LOMOTIL) 2.5-0.025 MG tablet, Take 1 tablet by mouth 4 (four) times daily as needed for diarrhea or loose stools., Disp: 30 tablet, Rfl: 0   escitalopram (LEXAPRO) 20 MG tablet, TAKE ONE TABLET BY MOUTH ONCE DAILY, Disp: 30 tablet, Rfl: 5   fluticasone (FLONASE) 50 MCG/ACT nasal spray, INSTILL 2 SPRAYS INTO BOTH NOSTRILS DAILY., Disp: 16 g, Rfl: 2   ibuprofen (ADVIL) 600 MG tablet, Take 1 tablet (600 mg total) by mouth every 8 (eight) hours as  needed for moderate pain. Take with food, Disp: 30 tablet, Rfl: 0   levothyroxine (SYNTHROID) 50 MCG tablet, Take 1 tablet (50 mcg total) by mouth daily., Disp: 90 tablet, Rfl: 3   ondansetron (ZOFRAN) 4 MG tablet, Take 1 tablet (4 mg total) by mouth every 8 (eight) hours as needed for nausea or vomiting., Disp: 20 tablet, Rfl: 0   Rimegepant Sulfate (NURTEC) 75 MG TBDP, Take 1 tablet by mouth daily as needed., Disp: 8 tablet, Rfl: 3   rosuvastatin (CRESTOR) 20 MG tablet, TAKE ONE TABLET BY MOUTH DAILY, Disp: 30 tablet, Rfl: 1   Semaglutide-Weight Management (WEGOVY) 1.7 MG/0.75ML SOAJ, Inject 1.7 mg into the skin once a week., Disp: 9 mL, Rfl: 1   SF 5000 PLUS 1.1 % CREA dental cream, Take 1 application by mouth at bedtime., Disp: , Rfl:    tiZANidine (ZANAFLEX) 4 MG tablet, TAKE ONE TABLET BY MOUTH TWICE DAILY AS NEEDED FOR MUSCLE SPASMS, Disp: 60 tablet, Rfl: 2   TYRVAYA 0.03 MG/ACT SOLN, SMARTSIG:1 Spray(s) Both Nares Twice a Week, Disp: , Rfl:    UNIFINE PENTIPS 31G X 6 MM MISC, USE AS DIRECTED WITH SAXENDA, Disp: , Rfl:    Objective:     Vitals:   08/08/22 1044  BP: 132/74  Weight: 154 lb (69.9 kg)  Height: '5\' 3"'$  (1.6 m)      Body mass index is 27.28 kg/m.    Physical Exam:    Gen: Appears well, nad, nontoxic and pleasant Psych: Alert and oriented, appropriate mood and affect Neuro: sensation intact, strength is 5/5 with df/pf/inv/ev, muscle tone wnl Skin: no susupicious lesions or rashes  Left ankle: no deformity, no swelling or effusion.  Patient showed me a picture on her phone of significant and generalized left lower extremity edema without erythema or discoloration that will happen at the end of active days when patient is ambulatory TTP lateral malleolus and distal third of fibula NTTP over fibular head,   medial mal, achilles, navicular, base of 5th, ATFL, CFL, deltoid, calcaneous or midfoot ROM DF 30, PF 45, inv/ev intact though lateral ankle pain with active and  passive inversion Negative ant drawer, talar tilt, rotation test, squeeze test. Neg thompson     Electronically signed by:  Margaret Green D.[facility] 11:08 AM 08/08/22

## 2022-08-10 ENCOUNTER — Encounter: Payer: Self-pay | Admitting: Internal Medicine

## 2022-08-17 ENCOUNTER — Other Ambulatory Visit (HOSPITAL_BASED_OUTPATIENT_CLINIC_OR_DEPARTMENT_OTHER): Payer: Self-pay

## 2022-08-18 ENCOUNTER — Encounter: Payer: Self-pay | Admitting: Internal Medicine

## 2022-08-21 MED ORDER — ZEPBOUND 2.5 MG/0.5ML ~~LOC~~ SOAJ: 2.5000 mg | SUBCUTANEOUS | 0 refills | Status: DC

## 2022-08-29 ENCOUNTER — Ambulatory Visit: Payer: BC Managed Care – PPO | Admitting: Sports Medicine

## 2022-08-31 NOTE — Progress Notes (Unsigned)
Benito Mccreedy D.[place] Phone: [phone]   Assessment and Plan:     There are no diagnoses linked to this encounter.  ***   Pertinent previous records reviewed include ***   Follow Up: ***     Subjective:   I, Jurnie Garritano, am serving as a Education administrator for Doctor [name]   Chief Complaint: left ankle pain    HPI:    08/08/22 Patient is a 61 year old female complaining of left ankle pain. Patient states that she was at the beach over the weekend and consumed a lot of shellfish which she does not normally do and her symptoms started. She reports the area has been swollen and a bit tender and somewhat painful to weight-bear on. She denies any prior injury, trauma, surgery to the ankle. She denies any mis-stepping or impact activities. Patient denies fevers, chills, chest pain or shortness of breath, redness about the joint, streaking, pus or drainage/open wounds about the joint. Patient has been taking ibuprofen but this provides little relief for her.    went to urgent care 11/6 - 3-4 days prior woke up with left ankle pain, swelling w/o injury.  Had pain on lateral ankle. Unable to bear weight.    She took colchicine.  Steroids did not help.     Swelling goes down at night and gets swollen throughout the day.  Tenderness around lateral malleolus and pain radiates up to 1/3 way up lower leg.      09/01/2022 Patient states    Relevant Historical Information: Raynaud's, GERD, hypothyroidism, fibromyalgia, prediabetes  Additional pertinent review of systems negative.   Current Outpatient Medications:    Azelastine-Fluticasone 137-50 MCG/ACT SUSP, Place 2 sprays into both nostrils daily., Disp: 23 g, Rfl: 5   Calcium Carb-Cholecalciferol 600-10 MG-MCG TABS, Take 1 tablet by mouth daily., Disp: , Rfl:    clotrimazole (CLOTRIMAZOLE ANTI-FUNGAL) 1 % cream, Apply 1 application topically 2 (two)  times daily. (Patient taking differently: Apply 1 application  topically as needed.), Disp: 30 g, Rfl: 0   diphenoxylate-atropine (LOMOTIL) 2.5-0.025 MG tablet, Take 1 tablet by mouth 4 (four) times daily as needed for diarrhea or loose stools., Disp: 30 tablet, Rfl: 0   escitalopram (LEXAPRO) 20 MG tablet, TAKE ONE TABLET BY MOUTH ONCE DAILY, Disp: 30 tablet, Rfl: 5   fluticasone (FLONASE) 50 MCG/ACT nasal spray, INSTILL 2 SPRAYS INTO BOTH NOSTRILS DAILY., Disp: 16 g, Rfl: 2   ibuprofen (ADVIL) 600 MG tablet, Take 1 tablet (600 mg total) by mouth every 8 (eight) hours as needed for moderate pain. Take with food, Disp: 30 tablet, Rfl: 0   levothyroxine (SYNTHROID) 50 MCG tablet, Take 1 tablet (50 mcg total) by mouth daily., Disp: 90 tablet, Rfl: 3   meloxicam (MOBIC) 15 MG tablet, Take 1 tablet (15 mg total) by mouth daily., Disp: 30 tablet, Rfl: 0   ondansetron (ZOFRAN) 4 MG tablet, Take 1 tablet (4 mg total) by mouth every 8 (eight) hours as needed for nausea or vomiting., Disp: 20 tablet, Rfl: 0   Rimegepant Sulfate (NURTEC) 75 MG TBDP, Take 1 tablet by mouth daily as needed., Disp: 8 tablet, Rfl: 3   rosuvastatin (CRESTOR) 20 MG tablet, TAKE ONE TABLET BY MOUTH DAILY, Disp: 30 tablet, Rfl: 1   Semaglutide-Weight Management (WEGOVY) 1.7 MG/0.75ML SOAJ, Inject 1.7 mg into the skin once a week., Disp: 9 mL, Rfl: 1   SF 5000 PLUS 1.1 %  CREA dental cream, Take 1 application by mouth at bedtime., Disp: , Rfl:    Tirzepatide-Weight Management (ZEPBOUND) 2.5 MG/0.5ML SOAJ, Inject 2.5 mg into the skin once a week., Disp: 2 mL, Rfl: 0   tiZANidine (ZANAFLEX) 4 MG tablet, TAKE ONE TABLET BY MOUTH TWICE DAILY AS NEEDED FOR MUSCLE SPASMS, Disp: 60 tablet, Rfl: 2   TYRVAYA 0.03 MG/ACT SOLN, SMARTSIG:1 Spray(s) Both Nares Twice a Week, Disp: , Rfl:    UNIFINE PENTIPS 31G X 6 MM MISC, USE AS DIRECTED WITH SAXENDA, Disp: , Rfl:    Objective:     There were no vitals filed for this visit.    There is no  height or weight on file to calculate BMI.    Physical Exam:    ***   Electronically signed by:  Benito Mccreedy D.[facility] 12:28 PM 08/31/22

## 2022-09-01 ENCOUNTER — Ambulatory Visit: Payer: BC Managed Care – PPO | Admitting: Sports Medicine

## 2022-09-01 VITALS — HR 71 | Ht 63.0 in | Wt 158.0 lb

## 2022-09-01 DIAGNOSIS — M25572 Pain in left ankle and joints of left foot: Secondary | ICD-10-CM

## 2022-09-01 NOTE — Patient Instructions (Signed)
Good to see you   

## 2022-10-14 ENCOUNTER — Ambulatory Visit (INDEPENDENT_AMBULATORY_CARE_PROVIDER_SITE_OTHER): Payer: BC Managed Care – PPO

## 2022-10-14 ENCOUNTER — Ambulatory Visit: Payer: BC Managed Care – PPO | Admitting: Sports Medicine

## 2022-10-14 VITALS — HR 67 | Ht 63.0 in | Wt 158.0 lb

## 2022-10-14 DIAGNOSIS — R0781 Pleurodynia: Secondary | ICD-10-CM

## 2022-10-14 MED ORDER — MELOXICAM 15 MG PO TABS: 15.0000 mg | ORAL_TABLET | Freq: Every day | ORAL | 0 refills | Status: DC

## 2022-10-14 MED ORDER — CYCLOBENZAPRINE HCL 5 MG PO TABS: 5.0000 mg | ORAL_TABLET | Freq: Every day | ORAL | 0 refills | Status: DC

## 2022-10-14 NOTE — Patient Instructions (Addendum)
Good to see you  - Start meloxicam 15 mg daily x2 weeks.  If still having pain after 2 weeks, complete 3rd-week of meloxicam. May use remaining meloxicam as needed once daily for pain control.  Do not to use additional NSAIDs while taking meloxicam.  May use Tylenol [phone] mg 2 to 3 times a day for breakthrough pain. Can use tizanidine 4-8 mg nightly as needed  An also use flexeril 5-10 mg nightly as needed if the tizanidine does not work  DO NOT USE TIZANIDINE AND FLEXERIL AT THE SAME TIME  As needed if no improvement 2-3 week follow up

## 2022-10-14 NOTE — Progress Notes (Signed)
Benito Mccreedy D.[place] Phone: [phone]   Assessment and Plan:    1. Rib pain -Acute, uncomplicated, initial visit - Consistent with strain of left anterior lateral rib cage from patient performing a stretching and reaching motion on 10/10/2022 - X-ray obtained in clinic.  My interpretation: No acute fracture and no sign of pneumothorax - Start meloxicam 15 mg daily x2 weeks.  If still having pain after 2 weeks, complete 3rd-week of meloxicam. May use remaining meloxicam as needed once daily for pain control.  Do not to use additional NSAIDs while taking meloxicam.  May use Tylenol [phone] mg 2 to 3 times a day for breakthrough pain. - May use tizanidine 4 to 8 mg nightly as needed for muscle spasms.  If ineffective, can instead use Flexeril 5 to 10 mg nightly as needed for muscle spasms.  Do not use tizanidine and Flexeril at the same time - DG Chest 2 View; Future    Pertinent previous records reviewed include none   Follow Up: As needed if no improvement or worsening of symptoms in 3 weeks   Subjective:   I, [name], am serving as a Education administrator for Doctor [name]  Chief Complaint: rib cage pain   HPI:   10/14/22 Patient is a 62 year old female complaining of rib pain. Patient states that she was in the bed on her right side and she bent to pick something up she heard a pop and felt something , no she has pain and it feels like something is moving in her ribs , bending over and coughing is painful, tylenol doesn't help much , pain is right under her breast and radiates to the back , she is having spasms and a grabbing pain , very TTP   Relevant Historical Information: Raynaud's, IBS, GERD, hypothyroidism  Additional pertinent review of systems negative.   Current Outpatient Medications:    Azelastine-Fluticasone 137-50 MCG/ACT SUSP, Place 2 sprays into both nostrils daily., Disp: 23 g,  Rfl: 5   Calcium Carb-Cholecalciferol 600-10 MG-MCG TABS, Take 1 tablet by mouth daily., Disp: , Rfl:    clotrimazole (CLOTRIMAZOLE ANTI-FUNGAL) 1 % cream, Apply 1 application topically 2 (two) times daily. (Patient taking differently: Apply 1 application  topically as needed.), Disp: 30 g, Rfl: 0   cyclobenzaprine (FLEXERIL) 5 MG tablet, Take 1 tablet (5 mg total) by mouth at bedtime., Disp: 30 tablet, Rfl: 0   diphenoxylate-atropine (LOMOTIL) 2.5-0.025 MG tablet, Take 1 tablet by mouth 4 (four) times daily as needed for diarrhea or loose stools., Disp: 30 tablet, Rfl: 0   escitalopram (LEXAPRO) 20 MG tablet, TAKE ONE TABLET BY MOUTH ONCE DAILY, Disp: 30 tablet, Rfl: 5   ibuprofen (ADVIL) 600 MG tablet, Take 1 tablet (600 mg total) by mouth every 8 (eight) hours as needed for moderate pain. Take with food, Disp: 30 tablet, Rfl: 0   levothyroxine (SYNTHROID) 50 MCG tablet, Take 1 tablet (50 mcg total) by mouth daily., Disp: 90 tablet, Rfl: 3   meloxicam (MOBIC) 15 MG tablet, Take 1 tablet (15 mg total) by mouth daily., Disp: 30 tablet, Rfl: 0   meloxicam (MOBIC) 15 MG tablet, Take 1 tablet (15 mg total) by mouth daily., Disp: 30 tablet, Rfl: 0   ondansetron (ZOFRAN) 4 MG tablet, Take 1 tablet (4 mg total) by mouth every 8 (eight) hours as needed for nausea or vomiting., Disp: 20 tablet, Rfl: 0   Rimegepant  Sulfate (NURTEC) 75 MG TBDP, Take 1 tablet by mouth daily as needed., Disp: 8 tablet, Rfl: 3   rosuvastatin (CRESTOR) 20 MG tablet, TAKE ONE TABLET BY MOUTH DAILY, Disp: 30 tablet, Rfl: 1   Semaglutide-Weight Management (WEGOVY) 1.7 MG/0.75ML SOAJ, Inject 1.7 mg into the skin once a week., Disp: 9 mL, Rfl: 1   SF 5000 PLUS 1.1 % CREA dental cream, Take 1 application by mouth at bedtime., Disp: , Rfl:    Tirzepatide-Weight Management (ZEPBOUND) 2.5 MG/0.5ML SOAJ, Inject 2.5 mg into the skin once a week., Disp: 2 mL, Rfl: 0   tiZANidine (ZANAFLEX) 4 MG tablet, TAKE ONE TABLET BY MOUTH TWICE DAILY AS  NEEDED FOR MUSCLE SPASMS, Disp: 60 tablet, Rfl: 2   TYRVAYA 0.03 MG/ACT SOLN, SMARTSIG:1 Spray(s) Both Nares Twice a Week, Disp: , Rfl:    UNIFINE PENTIPS 31G X 6 MM MISC, USE AS DIRECTED WITH SAXENDA, Disp: , Rfl:    fluticasone (FLONASE) 50 MCG/ACT nasal spray, INSTILL 2 SPRAYS INTO BOTH NOSTRILS DAILY., Disp: 16 g, Rfl: 2   Objective:     Vitals:   10/14/22 0951  Pulse: 67  SpO2: 100%  Weight: 158 lb (71.7 kg)  Height: '5\' 3"'$  (1.6 m)      Body mass index is 27.99 kg/m.    Physical Exam:    General: Well-appearing, cooperative, sitting comfortably in no acute distress.  HEENT: Normocephalic, atraumatic.   Neck: No gross abnormality.  Cardiovascular: No pallor or cyanosis. Resp: Comfortable WOB.  Pain in left anterior/lateral rib cage with deep inhalation and exhalation, without flail chest.  TTP to 6-8 on left Abdomen: Non distended.   Skin: Warm and dry; no focal rashes identified on limited exam. Extremities: No cyanosis or edema.  Neuro: Gross motor and sensory intact. Gait normal. Psychiatric: Mood and affect are appropriate.   Neuro: CN II-XII intact; strength and sensation intact generally    Electronically signed by:  Benito Mccreedy D.[facility] 10:17 AM 10/14/22

## 2022-12-06 ENCOUNTER — Other Ambulatory Visit: Payer: Self-pay | Admitting: Internal Medicine

## 2022-12-09 ENCOUNTER — Other Ambulatory Visit: Payer: Self-pay | Admitting: Internal Medicine

## 2022-12-21 ENCOUNTER — Other Ambulatory Visit: Payer: Self-pay | Admitting: Sports Medicine

## 2022-12-21 ENCOUNTER — Telehealth: Payer: Self-pay | Admitting: Sports Medicine

## 2022-12-21 MED ORDER — MELOXICAM 15 MG PO TABS: 15.0000 mg | ORAL_TABLET | Freq: Every day | ORAL | 0 refills | Status: DC

## 2022-12-21 NOTE — Telephone Encounter (Signed)
Pt called and notified of Dr. Edison Pace response.

## 2022-12-21 NOTE — Telephone Encounter (Signed)
Refill placed

## 2022-12-21 NOTE — Telephone Encounter (Signed)
Pt called, having r sided muscle spasms again. Pt would like Meloxicam called into [facility].  I informed pt that last OV for this was in 12/2021 and visit may be needed.

## 2022-12-26 ENCOUNTER — Other Ambulatory Visit: Payer: Self-pay | Admitting: Internal Medicine

## 2022-12-28 ENCOUNTER — Encounter: Payer: Self-pay | Admitting: Internal Medicine

## 2022-12-28 MED ORDER — TIZANIDINE HCL 4 MG PO TABS: ORAL_TABLET | ORAL | 0 refills | Status: DC

## 2023-01-06 ENCOUNTER — Ambulatory Visit (INDEPENDENT_AMBULATORY_CARE_PROVIDER_SITE_OTHER): Payer: BC Managed Care – PPO

## 2023-01-06 ENCOUNTER — Ambulatory Visit: Payer: BC Managed Care – PPO | Admitting: Sports Medicine

## 2023-01-06 VITALS — HR 60 | Ht 63.0 in | Wt 158.0 lb

## 2023-01-06 DIAGNOSIS — M79641 Pain in right hand: Secondary | ICD-10-CM | POA: Diagnosis not present

## 2023-01-06 NOTE — Patient Instructions (Addendum)
Good to see you - Start meloxicam 15 mg daily x2 weeks.  If still having pain after 2 weeks, complete 3rd-week of meloxicam. May use remaining meloxicam as needed once daily for pain control.  Do not to use additional NSAIDs while taking meloxicam.  May use Tylenol [phone] mg 2 to 3 times a day for breakthrough pain. Recommend relative rest with right hand  Can ice 1-2 times a day  As needed follow up, If no improvement in 1-2 weeks recommend coming back in

## 2023-01-06 NOTE — Progress Notes (Signed)
Margaret Green D.[facility] [address] [place] [address] Phone: [phone]   Assessment and Plan:     1. Right hand pain -Acute, uncomplicated, initial sports medicine visit - Pain along fifth metacarpal and fifth digit after FOOSH injury occurring 2 days ago on 01/04/2023 - Start meloxicam 15 mg daily x2 weeks.  If still having pain after 2 weeks, complete 3rd-week of meloxicam. May use remaining meloxicam as needed once daily for pain control.  Do not to use additional NSAIDs while taking meloxicam.  May use Tylenol [phone] mg 2 to 3 times a day for breakthrough pain. - Recommend relative rest and ice for right hand - X-rays obtained in clinic.  My interpretation: No acute fracture or dislocation.  Will await radiology review - DG Hand Complete Right; Future - DG Wrist Complete Right; Future    Pertinent previous records reviewed include none   Follow Up: As needed in 1 to 2 weeks if no improvement.  Could repeat x-rays to ensure no delayed fracture is seen.  Could also follow-up as needed for ongoing rib pain   Subjective:   I, Margaret Green, am serving as a Neurosurgeon for Doctor Richardean Sale  Chief Complaint: right hand and wrist pain   HPI:   01/06/23 Patient is a 62 year old female complaining of right hand and wrist pain. Patient states she stepped on a dog toy , 2 night ago, FOOSHED, decreased grip strength, isnt able to grip golf club, no radiating pain, no numbness or tingling, tylenolf or the pain and that helps a little , decreased ROM fingers   Relevant Historical Information:  Raynaud's, IBS, GER D, hypothyroidism  Additional pertinent review of systems negative.   Current Outpatient Medications:    Azelastine-Fluticasone 137-50 MCG/ACT SUSP, Place 2 sprays into both nostrils daily., Disp: 23 g, Rfl: 5   Calcium Carb-Cholecalciferol 600-10 MG-MCG TABS, Take 1 tablet by mouth daily., Disp: , Rfl:    clotrimazole  (CLOTRIMAZOLE ANTI-FUNGAL) 1 % cream, Apply 1 application topically 2 (two) times daily. (Patient taking differently: Apply 1 application  topically as needed.), Disp: 30 g, Rfl: 0   diphenoxylate-atropine (LOMOTIL) 2.5-0.025 MG tablet, Take 1 tablet by mouth 4 (four) times daily as needed for diarrhea or loose stools., Disp: 30 tablet, Rfl: 0   escitalopram (LEXAPRO) 20 MG tablet, TAKE ONE TABLET BY MOUTH ONCE DAILY, Disp: 30 tablet, Rfl: 5   ibuprofen (ADVIL) 600 MG tablet, Take 1 tablet (600 mg total) by mouth every 8 (eight) hours as needed for moderate pain. Take with food, Disp: 30 tablet, Rfl: 0   levothyroxine (SYNTHROID) 50 MCG tablet, TAKE ONE TABLET BY MOUTH DAILY, Disp: 90 tablet, Rfl: 1   meloxicam (MOBIC) 15 MG tablet, Take 1 tablet (15 mg total) by mouth daily., Disp: 30 tablet, Rfl: 0   meloxicam (MOBIC) 15 MG tablet, Take 1 tablet (15 mg total) by mouth daily., Disp: 30 tablet, Rfl: 0   ondansetron (ZOFRAN) 4 MG tablet, Take 1 tablet (4 mg total) by mouth every 8 (eight) hours as needed for nausea or vomiting., Disp: 20 tablet, Rfl: 0   Rimegepant Sulfate (NURTEC) 75 MG TBDP, Take 1 tablet by mouth daily as needed., Disp: 8 tablet, Rfl: 3   rosuvastatin (CRESTOR) 20 MG tablet, TAKE ONE TABLET BY MOUTH DAILY, Disp: 30 tablet, Rfl: 1   Semaglutide-Weight Management (WEGOVY) 1.7 MG/0.75ML SOAJ, Inject 1.7 mg into the skin once a week., Disp: 9 mL, Rfl:  1   SF 5000 PLUS 1.1 % CREA dental cream, Take 1 application by mouth at bedtime., Disp: , Rfl:    Tirzepatide-Weight Management (ZEPBOUND) 2.5 MG/0.5ML SOAJ, Inject 2.5 mg into the skin once a week., Disp: 2 mL, Rfl: 0   tiZANidine (ZANAFLEX) 4 MG tablet, TAKE ONE TABLET BY MOUTH TWICE DAILY AS NEEDED FOR MUSCLE SPASMS, Disp: 60 tablet, Rfl: 0   TYRVAYA 0.03 MG/ACT SOLN, SMARTSIG:1 Spray(s) Both Nares Twice a Week, Disp: , Rfl:    UNIFINE PENTIPS 31G X 6 MM MISC, USE AS DIRECTED WITH SAXENDA, Disp: , Rfl:    fluticasone (FLONASE) 50  MCG/ACT nasal spray, INSTILL 2 SPRAYS INTO BOTH NOSTRILS DAILY., Disp: 16 g, Rfl: 2   Objective:     Vitals:   01/06/23 0914  Pulse: 60  SpO2: 100%  Weight: 158 lb (71.7 kg)  Height: 5\' 3"  (1.6 m)      Body mass index is 27.99 kg/m.    Physical Exam:    General: Appears well, nad, nontoxic and pleasant Neuro:sensation intact, strength is 5/5 with df/pf/inv/ev, muscle tone wnl Skin:no susupicious lesions or rashes  Right hand/wrist:   No deformity.  Mild swelling along ulnar side of hand and fifth digit without ecchymosis, abrasion, erythema Wrist ROM  Ext 90, flexion70, radial/ulnar deviation 30 TTP fifth metacarpal, fifth MCP, fifth digit.  Range of motion intact and fifth digit and strength intact in all directions against resistance nttp over the snuff box, dorsal carpals, volar carpals, radial styloid, ulnar styloid, 1st mcp, tfcc    Electronically signed by:  Margaret Green [facility] 9:24 AM 01/06/23

## 2023-01-11 ENCOUNTER — Encounter: Payer: Self-pay | Admitting: Sports Medicine

## 2023-01-11 ENCOUNTER — Ambulatory Visit: Payer: BC Managed Care – PPO | Admitting: Sports Medicine

## 2023-01-11 ENCOUNTER — Ambulatory Visit (INDEPENDENT_AMBULATORY_CARE_PROVIDER_SITE_OTHER): Payer: BC Managed Care – PPO

## 2023-01-11 VITALS — HR 61 | Ht 63.0 in | Wt 158.0 lb

## 2023-01-11 DIAGNOSIS — M79641 Pain in right hand: Secondary | ICD-10-CM

## 2023-01-11 DIAGNOSIS — S62316A Displaced fracture of base of fifth metacarpal bone, right hand, initial encounter for closed fracture: Secondary | ICD-10-CM | POA: Diagnosis not present

## 2023-01-11 NOTE — Patient Instructions (Signed)
In exos for next 4-5 week Tylenol for day to day pain relief 5 week follow up

## 2023-01-11 NOTE — Progress Notes (Signed)
Margaret Green D.[facility] [address] [place] [address] Phone: [phone]   Assessment and Plan:     1. Right hand pain 2. Closed displaced fracture of base of fifth metacarpal bone of right hand, initial encounter  -Acute, complicated, subsequent sports medicine visit - Patient return to clinic because of fracture that was seen from previous x-ray at base of fifth metacarpal with minimal displacement.  X-ray repeated at today's visit and similar findings seen. - X-ray obtained in clinic.  My interpretation: Continued fracture of base of fifth metacarpal with minimal displacement - Patient placed in exocast at today's visit.  Instructed to be in cast at all times unless cleaning hand wrist area - Discontinue meloxicam due to potential delayed healing of fractures with NSAIDs - Use Tylenol for day-to-day pain relief - Do not recommend golf or bike riding at this time due to risk of fracture displacement  Pertinent previous records reviewed include none   Follow Up: 5 weeks for reevaluation.  If no pain at that time, would remove exocast and start physical therapy.  If pain remains, would repeat x-ray.   Subjective:   I, Margaret Green, am serving as a Neurosurgeon for Doctor Richardean Sale   Chief Complaint: right hand and wrist pain    HPI:    01/06/23 Patient is a 62 year old female complaining of right hand and wrist pain. Patient states she stepped on a dog toy , 2 night ago, FOOSHED, decreased grip strength, isnt able to grip golf club, no radiating pain, no numbness or tingling, tylenolf or the pain and that helps a little , decreased ROM fingers   01/11/2023 Patient states hand still swollen      Relevant Historical Information:  Raynaud's, IBS, GER D, hypothyroidism  Additional pertinent review of systems negative.   Current Outpatient Medications:    Azelastine-Fluticasone 137-50 MCG/ACT SUSP, Place 2 sprays into both  nostrils daily., Disp: 23 g, Rfl: 5   Calcium Carb-Cholecalciferol 600-10 MG-MCG TABS, Take 1 tablet by mouth daily., Disp: , Rfl:    clotrimazole (CLOTRIMAZOLE ANTI-FUNGAL) 1 % cream, Apply 1 application topically 2 (two) times daily. (Patient taking differently: Apply 1 application  topically as needed.), Disp: 30 g, Rfl: 0   diphenoxylate-atropine (LOMOTIL) 2.5-0.025 MG tablet, Take 1 tablet by mouth 4 (four) times daily as needed for diarrhea or loose stools., Disp: 30 tablet, Rfl: 0   escitalopram (LEXAPRO) 20 MG tablet, TAKE ONE TABLET BY MOUTH ONCE DAILY, Disp: 30 tablet, Rfl: 5   fluticasone (FLONASE) 50 MCG/ACT nasal spray, INSTILL 2 SPRAYS INTO BOTH NOSTRILS DAILY., Disp: 16 g, Rfl: 2   ibuprofen (ADVIL) 600 MG tablet, Take 1 tablet (600 mg total) by mouth every 8 (eight) hours as needed for moderate pain. Take with food, Disp: 30 tablet, Rfl: 0   levothyroxine (SYNTHROID) 50 MCG tablet, TAKE ONE TABLET BY MOUTH DAILY, Disp: 90 tablet, Rfl: 1   meloxicam (MOBIC) 15 MG tablet, Take 1 tablet (15 mg total) by mouth daily., Disp: 30 tablet, Rfl: 0   meloxicam (MOBIC) 15 MG tablet, Take 1 tablet (15 mg total) by mouth daily., Disp: 30 tablet, Rfl: 0   ondansetron (ZOFRAN) 4 MG tablet, Take 1 tablet (4 mg total) by mouth every 8 (eight) hours as needed for nausea or vomiting., Disp: 20 tablet, Rfl: 0   Rimegepant Sulfate (NURTEC) 75 MG TBDP, Take 1 tablet by mouth daily as needed., Disp: 8 tablet, Rfl: 3  rosuvastatin (CRESTOR) 20 MG tablet, TAKE ONE TABLET BY MOUTH DAILY, Disp: 30 tablet, Rfl: 1   Semaglutide-Weight Management (WEGOVY) 1.7 MG/0.75ML SOAJ, Inject 1.7 mg into the skin once a week., Disp: 9 mL, Rfl: 1   SF 5000 PLUS 1.1 % CREA dental cream, Take 1 application by mouth at bedtime., Disp: , Rfl:    Tirzepatide-Weight Management (ZEPBOUND) 2.5 MG/0.5ML SOAJ, Inject 2.5 mg into the skin once a week., Disp: 2 mL, Rfl: 0   tiZANidine (ZANAFLEX) 4 MG tablet, TAKE ONE TABLET BY MOUTH  TWICE DAILY AS NEEDED FOR MUSCLE SPASMS, Disp: 60 tablet, Rfl: 0   TYRVAYA 0.03 MG/ACT SOLN, SMARTSIG:1 Spray(s) Both Nares Twice a Week, Disp: , Rfl:    UNIFINE PENTIPS 31G X 6 MM MISC, USE AS DIRECTED WITH SAXENDA, Disp: , Rfl:    Objective:     Vitals:   01/11/23 1127  Pulse: 61  SpO2: 100%  Weight: 158 lb (71.7 kg)  Height: 5\' 3"  (1.6 m)      Body mass index is 27.99 kg/m.    Physical Exam:    General: Appears well, nad, nontoxic and pleasant Neuro:sensation intact, strength is 5/5 with df/pf/inv/ev, muscle tone wnl Skin:no susupicious lesions or rashes   Right hand/wrist:   No deformity.  Mild swelling with ecchymosis along ulnar side of hand and fifth digit without  , abrasion, erythema Adequate alignment of fingers in flexion and extension Wrist ROM  Ext 90, flexion70, radial/ulnar deviation 30 TTP fifth metacarpal, fifth MCP, fifth digit.  Range of motion intact and fifth digit and strength intact in all directions against resistance nttp over the snuff box, dorsal carpals, volar carpals, radial styloid, ulnar styloid, 1st mcp, tfcc     Electronically signed by:  Margaret Green [facility] 11:34 AM 01/11/23

## 2023-01-25 ENCOUNTER — Encounter: Payer: Self-pay | Admitting: Internal Medicine

## 2023-01-27 ENCOUNTER — Other Ambulatory Visit: Payer: Self-pay | Admitting: Internal Medicine

## 2023-01-27 MED ORDER — LOSARTAN POTASSIUM 50 MG PO TABS: 50.0000 mg | ORAL_TABLET | Freq: Every day | ORAL | 1 refills | Status: DC

## 2023-01-27 MED ORDER — LOSARTAN POTASSIUM 50 MG PO TABS: ORAL_TABLET | Freq: Every day | ORAL | 1 refills | Status: DC

## 2023-01-27 NOTE — Addendum Note (Signed)
Addended by: Pincus Sanes on: 01/27/2023 03:08 PM   Modules accepted: Orders

## 2023-02-03 ENCOUNTER — Encounter: Payer: Self-pay | Admitting: Internal Medicine

## 2023-02-03 MED ORDER — TRAZODONE HCL 50 MG PO TABS: 50.0000 mg | ORAL_TABLET | Freq: Every day | ORAL | 0 refills | Status: DC

## 2023-02-08 NOTE — Progress Notes (Signed)
Margaret Green [facility] [address] [place] [address] Phone: [phone]   Assessment and Plan:     1. Right hand pain 2. Closed displaced fracture of base of fifth metacarpal bone of right hand with routine healing, subsequent encounter  -Subacute, improving, subsequent visit - Overall improvement with decreased hand pain, and no tenderness to palpation of fracture site at fifth metacarpal.  No need for repeat x-ray at this time due to patient's improvement in symptoms and NTTP.  Findings today represents adequate healing of fifth metacarpal - May discontinue exocast - May continue Tylenol/as needed for day-to-day pain relief - Start HEP for wrist and hand strengthening  Pertinent previous records reviewed include none   Follow Up: As needed.  Could consider physical therapy if patient has reduced ROM or weakness   Subjective:   I, [name], am serving as a Neurosurgeon for Doctor Richardean Sale   Chief Complaint: right hand and wrist pain    HPI:    01/06/23 Patient is a 62 year old female complaining of right hand and wrist pain. Patient states she stepped on a dog toy , 2 night ago, FOOSHED, decreased grip strength, isnt able to grip golf club, no radiating pain, no numbness or tingling, tylenolf or the pain and that helps a little , decreased ROM fingers    01/11/2023 Patient states hand still swollen    02/15/2023 Patient states hand is doing good      Relevant Historical Information:  Raynaud's, IBS, GER D, hypothyroidism  Additional pertinent review of systems negative.   Current Outpatient Medications:    Azelastine-Fluticasone 137-50 MCG/ACT SUSP, Place 2 sprays into both nostrils daily., Disp: 23 g, Rfl: 5   Calcium Carb-Cholecalciferol 600-10 MG-MCG TABS, Take 1 tablet by mouth daily., Disp: , Rfl:    clotrimazole (CLOTRIMAZOLE ANTI-FUNGAL) 1 % cream, Apply 1 application topically 2 (two) times daily. (Patient  taking differently: Apply 1 application  topically as needed.), Disp: 30 g, Rfl: 0   diphenoxylate-atropine (LOMOTIL) 2.5-0.025 MG tablet, Take 1 tablet by mouth 4 (four) times daily as needed for diarrhea or loose stools., Disp: 30 tablet, Rfl: 0   escitalopram (LEXAPRO) 20 MG tablet, TAKE ONE TABLET BY MOUTH ONCE DAILY, Disp: 30 tablet, Rfl: 5   ibuprofen (ADVIL) 600 MG tablet, Take 1 tablet (600 mg total) by mouth every 8 (eight) hours as needed for moderate pain. Take with food, Disp: 30 tablet, Rfl: 0   levothyroxine (SYNTHROID) 50 MCG tablet, TAKE ONE TABLET BY MOUTH DAILY, Disp: 90 tablet, Rfl: 1   losartan (COZAAR) 50 MG tablet, Take 1 tablet (50 mg total) by mouth daily., Disp: 90 tablet, Rfl: 1   meloxicam (MOBIC) 15 MG tablet, Take 1 tablet (15 mg total) by mouth daily., Disp: 30 tablet, Rfl: 0   meloxicam (MOBIC) 15 MG tablet, Take 1 tablet (15 mg total) by mouth daily., Disp: 30 tablet, Rfl: 0   ondansetron (ZOFRAN) 4 MG tablet, Take 1 tablet (4 mg total) by mouth every 8 (eight) hours as needed for nausea or vomiting., Disp: 20 tablet, Rfl: 0   Rimegepant Sulfate (NURTEC) 75 MG TBDP, Take 1 tablet by mouth daily as needed., Disp: 8 tablet, Rfl: 3   rosuvastatin (CRESTOR) 20 MG tablet, TAKE ONE TABLET BY MOUTH DAILY, Disp: 30 tablet, Rfl: 1   Semaglutide-Weight Management (WEGOVY) 1.7 MG/0.75ML SOAJ, Inject 1.7 mg into the skin once a week., Disp: 9 mL, Rfl: 1  SF 5000 PLUS 1.1 % CREA dental cream, Take 1 application by mouth at bedtime., Disp: , Rfl:    tiZANidine (ZANAFLEX) 4 MG tablet, TAKE ONE TABLET BY MOUTH TWICE DAILY AS NEEDED FOR MUSCLE SPASMS, Disp: 60 tablet, Rfl: 0   traZODone (DESYREL) 50 MG tablet, Take 1 tablet (50 mg total) by mouth at bedtime., Disp: 30 tablet, Rfl: 0   TYRVAYA 0.03 MG/ACT SOLN, SMARTSIG:1 Spray(s) Both Nares Twice a Week, Disp: , Rfl:    UNIFINE PENTIPS 31G X 6 MM MISC, USE AS DIRECTED WITH SAXENDA, Disp: , Rfl:    fluticasone (FLONASE) 50 MCG/ACT  nasal spray, INSTILL 2 SPRAYS INTO BOTH NOSTRILS DAILY., Disp: 16 g, Rfl: 2   Objective:     Vitals:   02/15/23 1104  Pulse: (!) 54  SpO2: 99%  Weight: 156 lb (70.8 kg)  Height: 5\' 3"  (1.6 m)      Body mass index is 27.63 kg/m.    Physical Exam:    General: Appears well, nad, nontoxic and pleasant Neuro:sensation intact, strength is 5/5 with df/pf/inv/ev, muscle tone wnl Skin:no susupicious lesions or rashes  Right wrist:   No deformity or swelling appreciated. ROM  Ext 85, flexion 65, radial/ulnar deviation 30 nttp over the snuff box, dorsal carpals, volar carpals, radial styloid, ulnar styloid, 1st mcp, tfcc Negative Tinel's Negative finklestein Neg tfcc bounce test No pain with resisted ext, flex or deviation    Electronically signed by:  Margaret Green [facility] 11:23 AM 02/15/23

## 2023-02-15 ENCOUNTER — Ambulatory Visit: Payer: BC Managed Care – PPO | Admitting: Sports Medicine

## 2023-02-15 VITALS — HR 54 | Ht 63.0 in | Wt 156.0 lb

## 2023-02-15 DIAGNOSIS — M79641 Pain in right hand: Secondary | ICD-10-CM | POA: Diagnosis not present

## 2023-02-15 DIAGNOSIS — S62316D Displaced fracture of base of fifth metacarpal bone, right hand, subsequent encounter for fracture with routine healing: Secondary | ICD-10-CM | POA: Diagnosis not present

## 2023-02-15 NOTE — Patient Instructions (Signed)
May discontinue cast !!!  May continue tylenol as needed  Wrist and hand HEP  As needed follow up

## 2023-02-23 ENCOUNTER — Encounter: Payer: Self-pay | Admitting: Internal Medicine

## 2023-02-23 NOTE — Patient Instructions (Addendum)
      Blood work was ordered.   The lab is on the first floor.    Medications changes include :   None    A bone density was ordered for the [facility] center and someone should call you to schedule an appointment.     Return in about 6 months (around 08/26/2023) for Physical Exam.

## 2023-02-23 NOTE — Progress Notes (Signed)
Subjective:    Patient ID: Margaret Green, female    DOB: 11/15/1960, 62 y.o.   MRN: [id number]     HPI Margaret Green is here for follow up of her chronic medical problems.    Sleep still not great  Has regained some of the weight   Medications and allergies reviewed with patient and updated if appropriate.  Current Outpatient Medications on File Prior to Visit  Medication Sig Dispense Refill   Azelastine-Fluticasone 137-50 MCG/ACT SUSP Place 2 sprays into both nostrils daily. 23 g 5   Calcium Carb-Cholecalciferol 600-10 MG-MCG TABS Take 1 tablet by mouth daily.     clotrimazole (CLOTRIMAZOLE ANTI-FUNGAL) 1 % cream Apply 1 application topically 2 (two) times daily. (Patient taking differently: Apply 1 application  topically as needed.) 30 g 0   diphenoxylate-atropine (LOMOTIL) 2.5-0.025 MG tablet Take 1 tablet by mouth 4 (four) times daily as needed for diarrhea or loose stools. 30 tablet 0   escitalopram (LEXAPRO) 20 MG tablet TAKE ONE TABLET BY MOUTH ONCE DAILY 30 tablet 5   ibuprofen (ADVIL) 600 MG tablet Take 1 tablet (600 mg total) by mouth every 8 (eight) hours as needed for moderate pain. Take with food 30 tablet 0   levothyroxine (SYNTHROID) 50 MCG tablet TAKE ONE TABLET BY MOUTH DAILY 90 tablet 1   meloxicam (MOBIC) 15 MG tablet Take 1 tablet (15 mg total) by mouth daily. 30 tablet 0   Rimegepant Sulfate (NURTEC) 75 MG TBDP Take 1 tablet by mouth daily as needed. 8 tablet 3   rosuvastatin (CRESTOR) 20 MG tablet TAKE ONE TABLET BY MOUTH DAILY 30 tablet 1   tiZANidine (ZANAFLEX) 4 MG tablet TAKE ONE TABLET BY MOUTH TWICE DAILY AS NEEDED FOR MUSCLE SPASMS 60 tablet 0   traZODone (DESYREL) 50 MG tablet Take 1 tablet (50 mg total) by mouth at bedtime. 30 tablet 0   TYRVAYA 0.03 MG/ACT SOLN SMARTSIG:1 Spray(s) Both Nares Twice a Week     UNIFINE PENTIPS 31G X 6 MM MISC USE AS DIRECTED WITH SAXENDA     fluticasone (FLONASE) 50 MCG/ACT nasal spray INSTILL 2 SPRAYS INTO  BOTH NOSTRILS DAILY. 16 g 2   No current facility-administered medications on file prior to visit.     Review of Systems  Constitutional:  Negative for fever.  Respiratory:  Negative for cough, shortness of breath and wheezing.   Cardiovascular:  Negative for chest pain, palpitations and leg swelling.  Neurological:  Positive for headaches (occ). Negative for light-headedness.       Objective:   Vitals:   02/24/23 0958 02/24/23 1001  BP: (!) 140/80 (!) 140/80  Pulse: (!) 59   Temp: 98 F (36.7 C)   SpO2: 98%    BP Readings from Last 3 Encounters:  02/24/23 (!) 140/80  08/08/22 132/74  08/08/22 132/74   Wt Readings from Last 3 Encounters:  02/24/23 167 lb (75.8 kg)  02/15/23 156 lb (70.8 kg)  01/11/23 158 lb (71.7 kg)   Body mass index is 29.58 kg/m.    Physical Exam     Lab Results  Component Value Date   WBC 4.9 08/27/2021   HGB 14.4 08/27/2021   HCT 44.3 08/27/2021   PLT 258 08/27/2021   GLUCOSE 86 04/05/2022   CHOL 171 04/05/2022   TRIG 100.0 04/05/2022   HDL 51.00 04/05/2022   LDLDIRECT 103.0 02/05/2019   LDLCALC 100 (H) 04/05/2022   ALT 18 04/05/2022   AST 20 04/05/2022  NA 139 04/05/2022   K 4.6 04/05/2022   CL 102 04/05/2022   CREATININE 0.82 04/05/2022   BUN 12 04/05/2022   CO2 32 04/05/2022   TSH 3.56 04/05/2022   HGBA1C 5.7 04/05/2022     Assessment & Plan:    See Problem List for Assessment and Plan of chronic medical problems.

## 2023-02-24 ENCOUNTER — Ambulatory Visit: Payer: BC Managed Care – PPO | Admitting: Internal Medicine

## 2023-02-24 ENCOUNTER — Encounter: Payer: Self-pay | Admitting: Internal Medicine

## 2023-02-24 VITALS — BP 140/80 | HR 59 | Temp 98.0°F | Ht 63.0 in | Wt 167.0 lb

## 2023-02-24 DIAGNOSIS — M8589 Other specified disorders of bone density and structure, multiple sites: Secondary | ICD-10-CM

## 2023-02-24 DIAGNOSIS — E559 Vitamin D deficiency, unspecified: Secondary | ICD-10-CM

## 2023-02-24 DIAGNOSIS — E538 Deficiency of other specified B group vitamins: Secondary | ICD-10-CM

## 2023-02-24 DIAGNOSIS — I10 Essential (primary) hypertension: Secondary | ICD-10-CM

## 2023-02-24 DIAGNOSIS — F3289 Other specified depressive episodes: Secondary | ICD-10-CM

## 2023-02-24 DIAGNOSIS — E039 Hypothyroidism, unspecified: Secondary | ICD-10-CM | POA: Diagnosis not present

## 2023-02-24 DIAGNOSIS — E669 Obesity, unspecified: Secondary | ICD-10-CM

## 2023-02-24 DIAGNOSIS — E7849 Other hyperlipidemia: Secondary | ICD-10-CM | POA: Diagnosis not present

## 2023-02-24 DIAGNOSIS — F411 Generalized anxiety disorder: Secondary | ICD-10-CM

## 2023-02-24 DIAGNOSIS — R7303 Prediabetes: Secondary | ICD-10-CM | POA: Diagnosis not present

## 2023-02-24 DIAGNOSIS — G479 Sleep disorder, unspecified: Secondary | ICD-10-CM | POA: Insufficient documentation

## 2023-02-24 DIAGNOSIS — M797 Fibromyalgia: Secondary | ICD-10-CM

## 2023-02-24 LAB — COMPREHENSIVE METABOLIC PANEL
ALT: 16 U/L (ref 0–35)
AST: 20 U/L (ref 0–37)
Albumin: 4.4 g/dL (ref 3.5–5.2)
Alkaline Phosphatase: 67 U/L (ref 39–117)
BUN: 10 mg/dL (ref 6–23)
CO2: 29 mEq/L (ref 19–32)
Calcium: 9.7 mg/dL (ref 8.4–10.5)
Chloride: 103 mEq/L (ref 96–112)
Creatinine, Ser: 0.81 mg/dL (ref 0.40–1.20)
GFR: 78.51 mL/min (ref >60.00)
Glucose, Bld: 95 mg/dL (ref 70–99)
Potassium: 4.5 mEq/L (ref 3.5–5.1)
Sodium: 139 mEq/L (ref 135–145)
Total Bilirubin: 0.5 mg/dL (ref 0.2–1.2)
Total Protein: 7.5 g/dL (ref 6.0–8.3)

## 2023-02-24 LAB — CBC WITH DIFFERENTIAL/PLATELET
Basophils Absolute: 0 10*3/uL (ref 0.0–0.1)
Basophils Relative: 0.1 % (ref 0.0–3.0)
Eosinophils Absolute: 0 10*3/uL (ref 0.0–0.7)
Eosinophils Relative: 1.4 % (ref 0.0–5.0)
HCT: 40.9 % (ref 36.0–46.0)
Hemoglobin: 13.6 g/dL (ref 12.0–15.0)
Lymphocytes Relative: 48.4 % — ABNORMAL HIGH (ref 12.0–46.0)
Lymphs Abs: 1.6 10*3/uL (ref 0.7–4.0)
MCHC: 33.2 g/dL (ref 30.0–36.0)
MCV: 98.3 fl (ref 78.0–100.0)
Monocytes Absolute: 0.3 10*3/uL (ref 0.1–1.0)
Monocytes Relative: 10.1 % (ref 3.0–12.0)
Neutro Abs: 1.3 10*3/uL — ABNORMAL LOW (ref 1.4–7.7)
Neutrophils Relative %: 40 % — ABNORMAL LOW (ref 43.0–77.0)
Platelets: 291 10*3/uL (ref 150.0–400.0)
RBC: 4.16 Mil/uL (ref 3.87–5.11)
RDW: 13.7 % (ref 11.5–15.5)
WBC: 3.4 10*3/uL — ABNORMAL LOW (ref 4.0–10.5)

## 2023-02-24 LAB — LIPID PANEL
Cholesterol: 222 mg/dL — ABNORMAL HIGH (ref 0–200)
HDL: 64.8 mg/dL (ref >39.00)
LDL Cholesterol: 141 mg/dL — ABNORMAL HIGH (ref 0–99)
NonHDL: 157.01
Total CHOL/HDL Ratio: 3
Triglycerides: 82 mg/dL (ref 0.0–149.0)
VLDL: 16.4 mg/dL (ref 0.0–40.0)

## 2023-02-24 LAB — VITAMIN D 25 HYDROXY (VIT D DEFICIENCY, FRACTURES): VITD: 23.36 ng/mL — ABNORMAL LOW (ref 30.00–100.00)

## 2023-02-24 LAB — TSH: TSH: 3.67 u[IU]/mL (ref 0.35–5.50)

## 2023-02-24 LAB — HEMOGLOBIN A1C: Hgb A1c MFr Bld: 5.7 % (ref 4.6–6.5)

## 2023-02-24 LAB — VITAMIN B12: Vitamin B-12: 375 pg/mL (ref 211–911)

## 2023-02-24 MED ORDER — TRAZODONE HCL 50 MG PO TABS: 75.0000 mg | ORAL_TABLET | Freq: Every day | ORAL | 1 refills | Status: DC

## 2023-02-24 MED ORDER — LOSARTAN POTASSIUM 100 MG PO TABS: 100.0000 mg | ORAL_TABLET | Freq: Every day | ORAL | 2 refills | Status: DC

## 2023-02-24 MED ORDER — ZEPBOUND 2.5 MG/0.5ML ~~LOC~~ SOAJ: 2.5000 mg | SUBCUTANEOUS | 0 refills | Status: DC

## 2023-02-24 NOTE — Assessment & Plan Note (Signed)
Chronic Regular exercise and healthy diet encouraged Check lipid panel  Continue Crestor 20 mg daily 

## 2023-02-24 NOTE — Assessment & Plan Note (Addendum)
Chronic Stressed regular exercise, stretching Continue tizanidine 4 mg bid prn  -usually takes once a day

## 2023-02-24 NOTE — Assessment & Plan Note (Addendum)
Chronic DEXA due - ordered Stressed regular exercise Continue calcium and vitamin D daily Check vitamin D level

## 2023-02-24 NOTE — Assessment & Plan Note (Signed)
Chronic ?Controlled, Stable ?Continue Lexapro 20 mg daily ?

## 2023-02-24 NOTE — Assessment & Plan Note (Signed)
Chronic Taking vitamin D daily Check vitamin D level  

## 2023-02-24 NOTE — Assessment & Plan Note (Addendum)
Chronic Check a1c Has lost weight and regained some Low sugar / carb diet Continue regular exercise

## 2023-02-24 NOTE — Assessment & Plan Note (Signed)
Chronic Taking B12 Check B12 level 

## 2023-02-24 NOTE — Assessment & Plan Note (Signed)
Chronic  Clinically euthyroid Currently taking levothyroxine 50 mcg daily Check tsh  Titrate med dose if needed  

## 2023-02-24 NOTE — Assessment & Plan Note (Addendum)
Chronic Blood pressure not well controlled Increase losartan to 100 mg daily CMP, CBC

## 2023-02-24 NOTE — Assessment & Plan Note (Signed)
Chronic Lost weight with wegovy and mounjaro Off medication x 6 months Has regained some weight Would like helps - wants to see if zepbound is covered Zepbound 2.5 mg weekly

## 2023-02-24 NOTE — Assessment & Plan Note (Signed)
Chronic Started trazodone via mychart Sleep better but still not getting enough sleep Increase trazodone to 75mg  daily

## 2023-02-27 ENCOUNTER — Other Ambulatory Visit: Payer: Self-pay | Admitting: Internal Medicine

## 2023-03-02 ENCOUNTER — Encounter: Payer: Self-pay | Admitting: Radiology

## 2023-04-10 ENCOUNTER — Other Ambulatory Visit: Payer: Self-pay

## 2023-04-10 ENCOUNTER — Other Ambulatory Visit (HOSPITAL_COMMUNITY): Payer: Self-pay

## 2023-04-10 ENCOUNTER — Telehealth: Payer: Self-pay

## 2023-04-10 ENCOUNTER — Encounter: Payer: Self-pay | Admitting: Internal Medicine

## 2023-04-10 DIAGNOSIS — G43011 Migraine without aura, intractable, with status migrainosus: Secondary | ICD-10-CM

## 2023-04-10 MED ORDER — NURTEC 75 MG PO TBDP
1.0000 | ORAL_TABLET | Freq: Every day | ORAL | 3 refills | Status: DC | PRN
Start: 2023-04-10 — End: 2024-04-16

## 2023-04-10 NOTE — Telephone Encounter (Signed)
Pharmacy Patient Advocate Encounter   Received notification from Pt Calls Messages that prior authorization for Nurtec 75mg  is required/requested.   Insurance verification completed.   The patient is insured through [facility] .   Per test claim: PA required; PA started via CoverMyMeds. KEY [id number] . Waiting for clinical questions to populate.

## 2023-04-11 ENCOUNTER — Other Ambulatory Visit (HOSPITAL_COMMUNITY): Payer: Self-pay

## 2023-04-11 NOTE — Telephone Encounter (Signed)
Clinical questions answered and PA submitted

## 2023-04-12 NOTE — Telephone Encounter (Signed)
Prior auth started by PA team

## 2023-04-17 ENCOUNTER — Other Ambulatory Visit (HOSPITAL_COMMUNITY): Payer: Self-pay

## 2023-04-17 NOTE — Telephone Encounter (Signed)
Pharmacy Patient Advocate Encounter  Received notification from [facility] that Prior Authorization for Nurtec 75mg  has been APPROVED from 04/15/23 to 04/10/24. Ran test claim, Copay is $0 with [facility]  PA #/Case ID/Reference #: [id number]  Placed a call to [facility] pharmacy to notify of the approval and the pharmacist stated she received a paid claim.

## 2023-04-17 NOTE — Telephone Encounter (Signed)
Patient notified via mychart

## 2023-04-21 ENCOUNTER — Other Ambulatory Visit: Payer: Self-pay | Admitting: Internal Medicine

## 2023-06-13 ENCOUNTER — Encounter: Payer: Self-pay | Admitting: Internal Medicine

## 2023-06-28 ENCOUNTER — Encounter: Payer: Self-pay | Admitting: Internal Medicine

## 2023-07-12 ENCOUNTER — Encounter: Payer: Self-pay | Admitting: Internal Medicine

## 2023-07-13 MED ORDER — BENZONATATE 200 MG PO CAPS: 200.0000 mg | ORAL_CAPSULE | Freq: Three times a day (TID) | ORAL | 0 refills | Status: DC | PRN

## 2023-07-20 ENCOUNTER — Other Ambulatory Visit: Payer: Self-pay | Admitting: Internal Medicine

## 2023-07-22 IMAGING — DX DG SI JOINTS 3+V
4 series · 4 of 4 positions shown · non-contrast
Comparison: None.

CLINICAL DATA: Right sacroiliac joint pain for 3 days.

EXAM:
BILATERAL SACROILIAC JOINTS - 4 VIEW

[pelvis ap (1 of 2)]
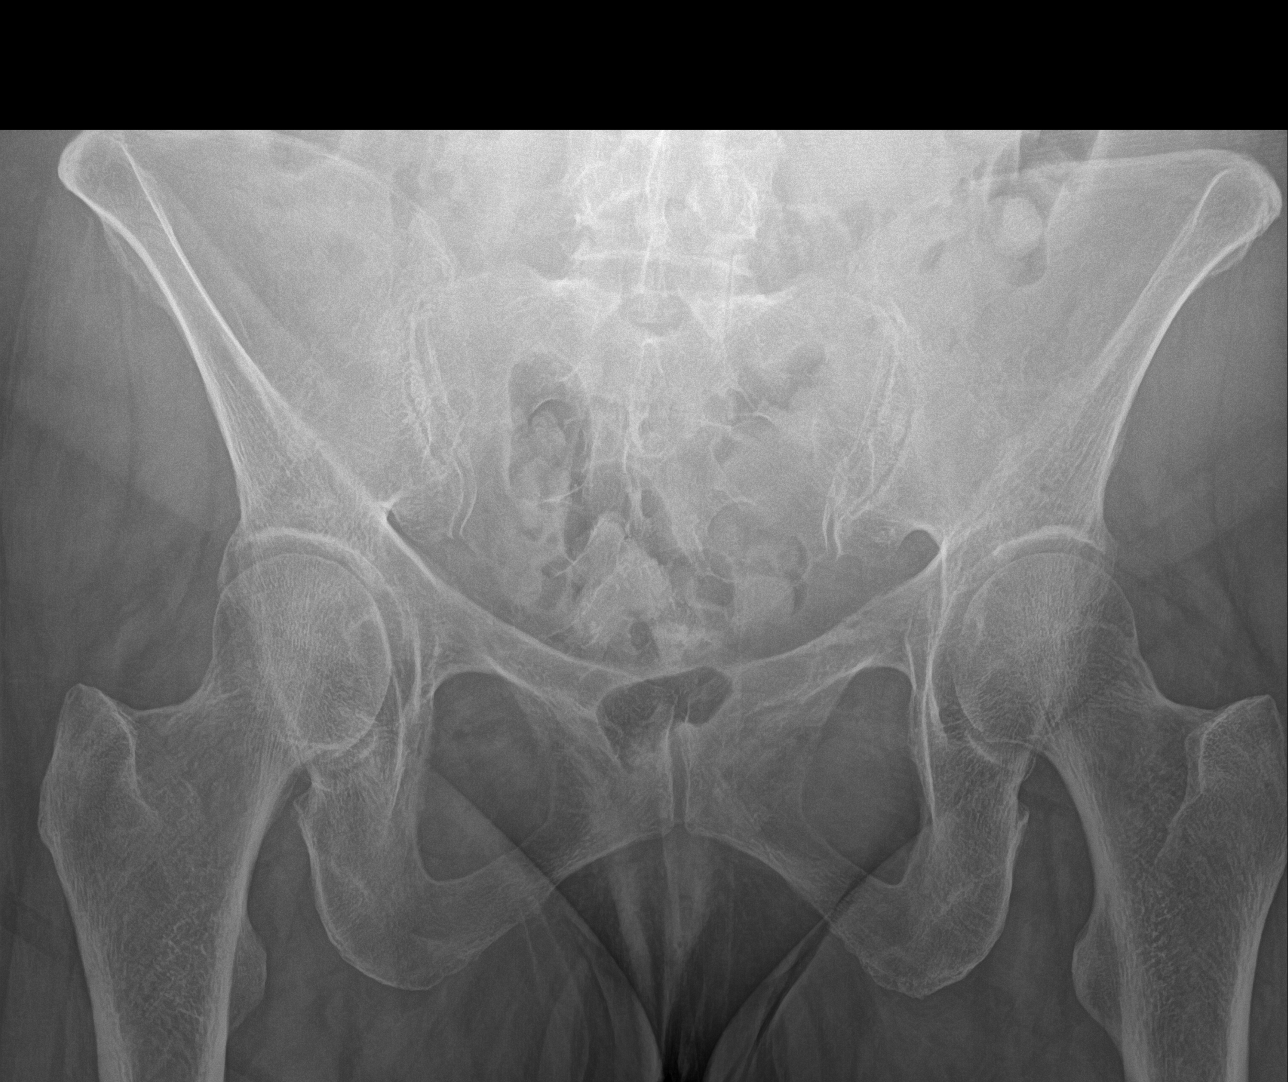

[si joint (1 of 2)]
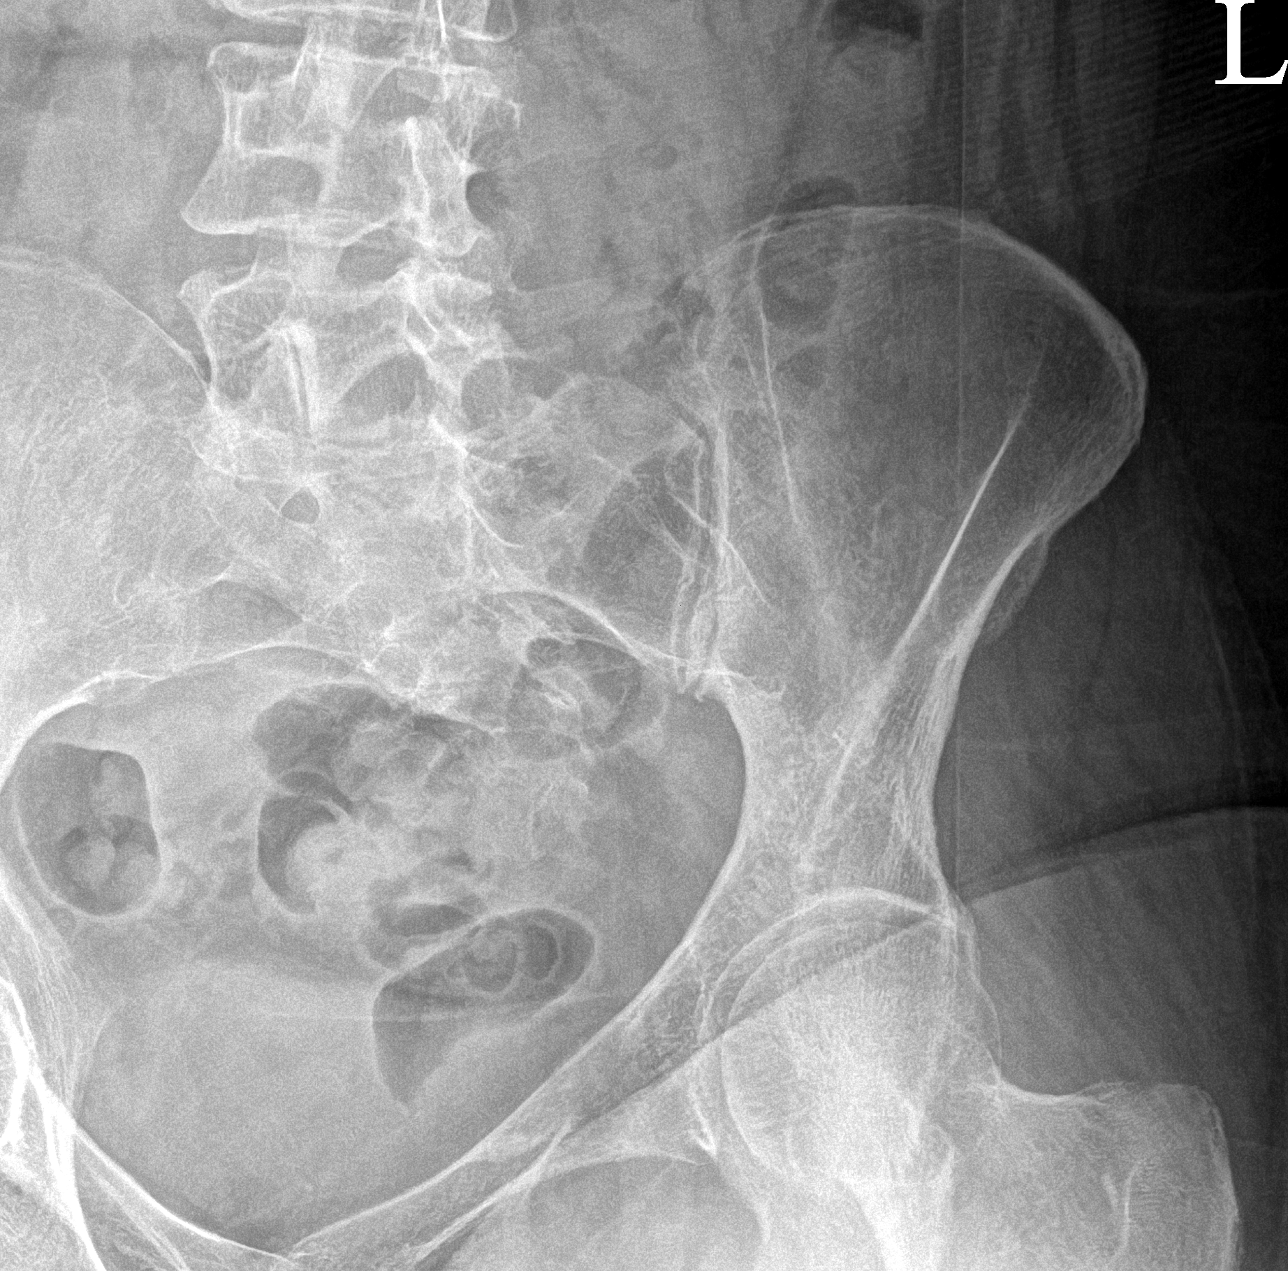

[si joint (2 of 2)]
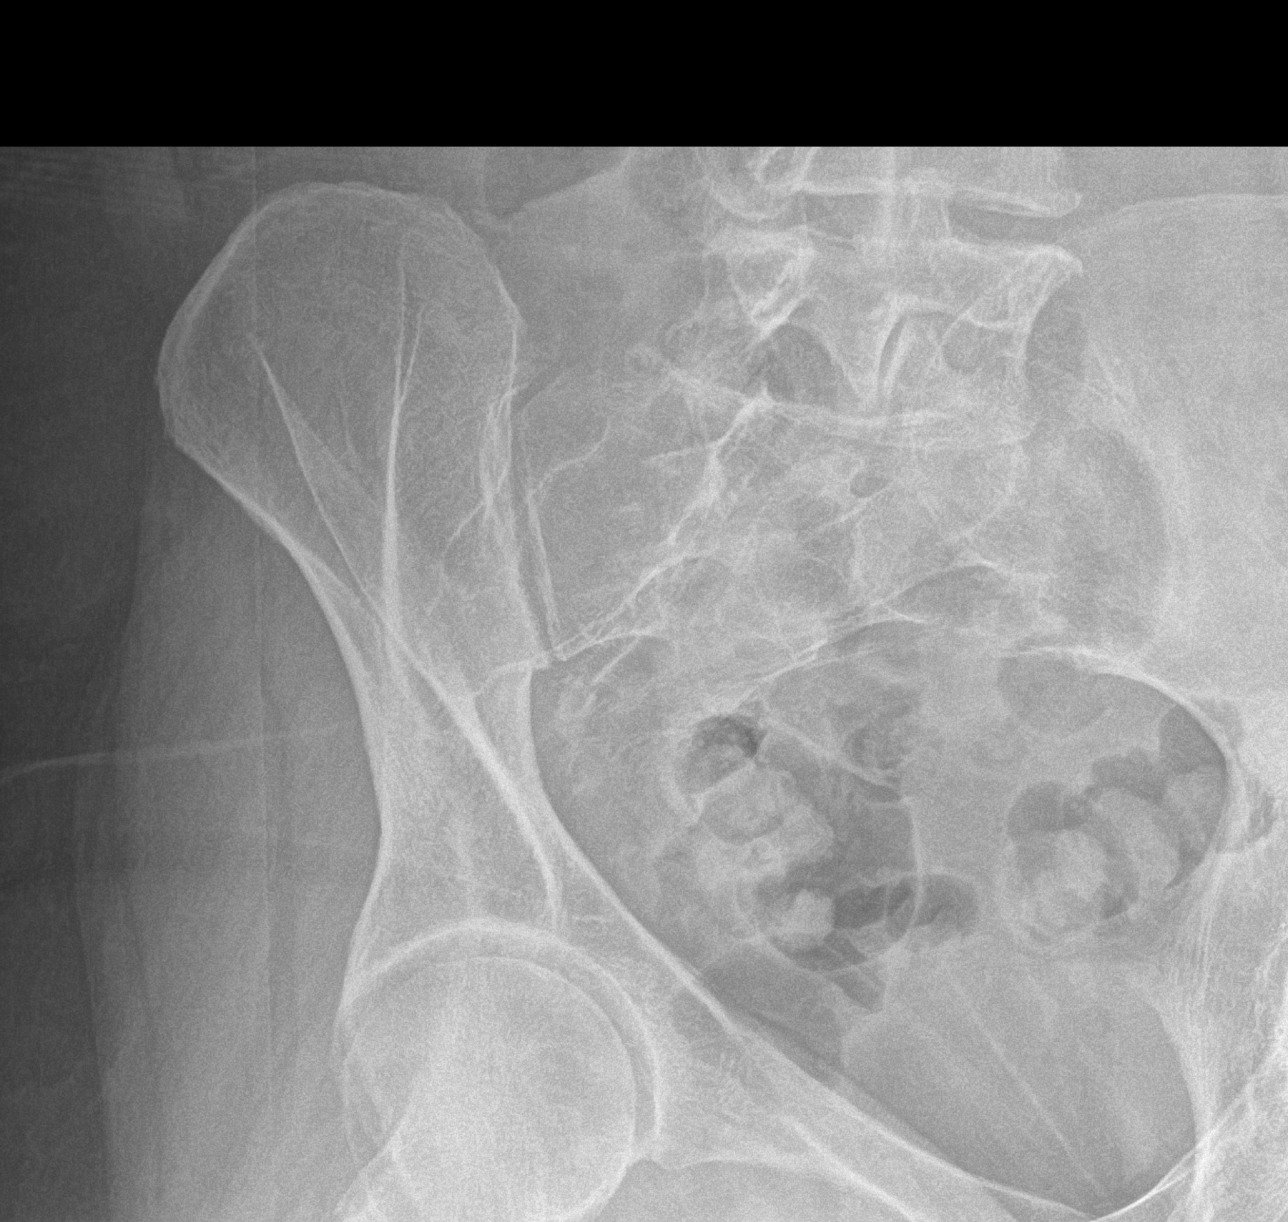

[pelvis ap (2 of 2)]
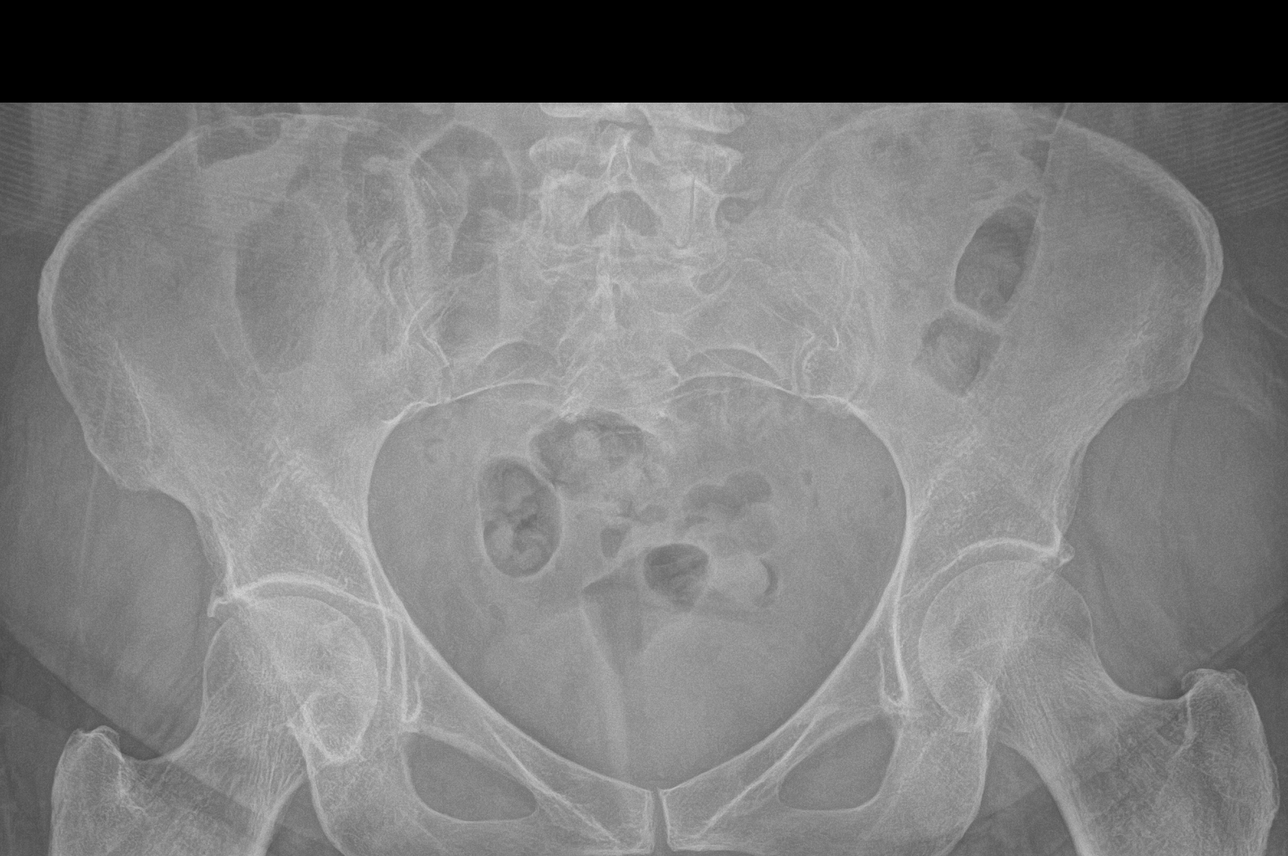

[4 of 4 positions shown; findings below may reference images not displayed]

FINDINGS: The sacroiliac joint spaces are maintained and there is no evidence
of arthropathy. No other bone abnormalities are seen.
IMPRESSION: Negative.

## 2023-07-25 ENCOUNTER — Other Ambulatory Visit: Payer: Self-pay | Admitting: Internal Medicine

## 2023-08-21 ENCOUNTER — Other Ambulatory Visit: Payer: Self-pay | Admitting: Internal Medicine

## 2023-08-21 ENCOUNTER — Encounter: Payer: Self-pay | Admitting: Internal Medicine

## 2023-08-22 ENCOUNTER — Other Ambulatory Visit: Payer: Self-pay

## 2023-08-23 MED ORDER — ESTRADIOL 0.05 MG/24HR TD PTTW: MEDICATED_PATCH | TRANSDERMAL | 0 refills | Status: AC

## 2023-08-30 ENCOUNTER — Other Ambulatory Visit: Payer: Self-pay | Admitting: Internal Medicine

## 2023-09-26 ENCOUNTER — Encounter: Payer: Self-pay | Admitting: Internal Medicine

## 2023-09-26 DIAGNOSIS — Z79890 Hormone replacement therapy (postmenopausal): Secondary | ICD-10-CM | POA: Insufficient documentation

## 2023-09-26 NOTE — Progress Notes (Signed)
 Subjective:    Patient ID: Margaret Green, female    DOB: 1961-07-01, 63 y.o.   MRN: [id number]      HPI Margaret Green is here for a Physical exam and her chronic medical problems.   Sharp pain in left thumb going down thumb into wrist. It just start.  No dec ROM. Finger is not getting stuck.    Medications and allergies reviewed with patient and updated if appropriate.  Current Outpatient Medications on File Prior to Visit  Medication Sig Dispense Refill   Azelastine -Fluticasone  137-50 MCG/ACT SUSP Place 2 sprays into both nostrils daily. 23 g 5   Calcium  Carb-Cholecalciferol 600-10 MG-MCG TABS Take 1 tablet by mouth daily.     clotrimazole  (CLOTRIMAZOLE  ANTI-FUNGAL) 1 % cream Apply 1 application topically 2 (two) times daily. (Patient taking differently: Apply 1 application  topically as needed.) 30 g 0   diphenoxylate -atropine  (LOMOTIL ) 2.5-0.025 MG tablet Take 1 tablet by mouth 4 (four) times daily as needed for diarrhea or loose stools. 30 tablet 0   estradiol  (DOTTI ) 0.05 MG/24HR patch PLACE 1 PATCH (0.05 MG TOTAL) ONTO THE SKIN 2 (TWO) TIMES A WEEK - WEDNESDAY AND SUNDAY. 24 patch 0   ibuprofen  (ADVIL ) 600 MG tablet Take 1 tablet (600 mg total) by mouth every 8 (eight) hours as needed for moderate pain. Take with food 30 tablet 0   levothyroxine  (SYNTHROID ) 50 MCG tablet TAKE ONE TABLET BY MOUTH DAILY 90 tablet 1   losartan  (COZAAR ) 100 MG tablet Take 1 tablet (100 mg total) by mouth daily. 90 tablet 2   meloxicam  (MOBIC ) 15 MG tablet Take 1 tablet (15 mg total) by mouth daily. 30 tablet 0   Rimegepant Sulfate (NURTEC) 75 MG TBDP Take 1 tablet (75 mg total) by mouth daily as needed. 8 tablet 3   tiZANidine  (ZANAFLEX ) 4 MG tablet TAKE ONE TABLET BY MOUTH TWICE DAILY AS NEEDED FOR MUSCLE SPASMS 180 tablet 1   traZODone  (DESYREL ) 50 MG tablet Take 1.5 tablets (75 mg total) by mouth at bedtime. 135 tablet 1   TYRVAYA 0.03 MG/ACT SOLN SMARTSIG:1 Spray(s) Both Nares Twice a Week      UNIFINE PENTIPS 31G X 6 MM MISC USE AS DIRECTED WITH SAXENDA      fluticasone  (FLONASE ) 50 MCG/ACT nasal spray INSTILL 2 SPRAYS INTO BOTH NOSTRILS DAILY. 16 g 2   No current facility-administered medications on file prior to visit.    Review of Systems  Constitutional:  Negative for fever.  Eyes:  Positive for visual disturbance.  Respiratory:  Positive for cough (residual from cold). Negative for shortness of breath and wheezing.   Cardiovascular:  Positive for leg swelling (occ in feet). Negative for chest pain and palpitations.  Gastrointestinal:  Negative for abdominal pain, blood in stool, constipation and diarrhea.       Occ gerd  Genitourinary:  Negative for dysuria.  Musculoskeletal:  Positive for arthralgias and back pain (occ lower back).  Skin:  Negative for rash.       More skin tags  Neurological:  Negative for light-headedness and headaches.  Psychiatric/Behavioral:  Positive for dysphoric mood. The patient is nervous/anxious.        Objective:   Vitals:   09/27/23 1302  BP: 136/74  Pulse: (!) 59  Temp: 98.4 F (36.9 C)  SpO2: 98%   Filed Weights   09/27/23 1302  Weight: 180 lb (81.6 kg)   Body mass index is 31.89 kg/m.  BP Readings from Last 3 Encounters:  09/27/23 136/74  02/24/23 (!) 140/80  08/08/22 132/74    Wt Readings from Last 3 Encounters:  09/27/23 180 lb (81.6 kg)  02/24/23 167 lb (75.8 kg)  02/15/23 156 lb (70.8 kg)       Physical Exam Constitutional: She appears well-developed and well-nourished. No distress.  HENT:  Head: Normocephalic and atraumatic.  Right Ear: External ear normal. Normal ear canal and TM Left Ear: External ear normal.  Normal ear canal and TM Mouth/Throat: Oropharynx is clear and moist.  Eyes: Conjunctivae normal.  Neck: Neck supple. No tracheal deviation present. No thyromegaly present.  No carotid bruit  Cardiovascular: Normal rate, regular rhythm and normal heart sounds.   No murmur heard.  No  edema. Pulmonary/Chest: Effort normal and breath sounds normal. No respiratory distress. She has no wheezes. She has no rales.  Breast: deferred   Abdominal: Soft. She exhibits no distension. There is no tenderness.  Lymphadenopathy: She has no cervical adenopathy.  Skin: Skin is warm and dry. She is not diaphoretic.  Psychiatric: She has a normal mood and affect. Her behavior is normal.     Lab Results  Component Value Date   WBC 4.4 09/27/2023   HGB 14.4 09/27/2023   HCT 43.7 09/27/2023   PLT 280.0 09/27/2023   GLUCOSE 71 09/27/2023   CHOL 204 (H) 09/27/2023   TRIG 108.0 09/27/2023   HDL 61.90 09/27/2023   LDLDIRECT 103.0 02/05/2019   LDLCALC 120 (H) 09/27/2023   ALT 21 09/27/2023   AST 21 09/27/2023   NA 139 09/27/2023   K 3.7 09/27/2023   CL 103 09/27/2023   CREATININE 0.74 09/27/2023   BUN 12 09/27/2023   CO2 28 09/27/2023   TSH 5.47 09/27/2023   HGBA1C 5.8 09/27/2023         Assessment & Plan:   Physical exam: Screening blood work  ordered Exercise  not regular Weight  obese- has regained weight Substance abuse  none   Reviewed recommended immunizations.   Health Maintenance  Topic Date Due   Zoster Vaccines- Shingrix (1 of 2) Never done   MAMMOGRAM  10/05/2020   DEXA SCAN  02/03/2023   COVID-19 Vaccine (1) 10/13/2023 (Originally 01/09/1966)   Colonoscopy  09/01/2024   DTaP/Tdap/Td (3 - Td or Tdap) 06/21/2027   INFLUENZA VACCINE  Completed   Hepatitis C Screening  Completed   HIV Screening  Completed   Pneumococcal Vaccine 50-69 Years old  Aged Out   HPV VACCINES  Aged Out          See Problem List for Assessment and Plan of chronic medical problems.

## 2023-09-26 NOTE — Patient Instructions (Addendum)
 Blood work was ordered.       Medications changes include :   None     Return in about 6 months (around 03/26/2024) for follow up.   Health Maintenance, Female Adopting a healthy lifestyle and getting preventive care are important in promoting health and wellness. Ask your health care provider about: The right schedule for you to have regular tests and exams. Things you can do on your own to prevent diseases and keep yourself healthy. What should I know about diet, weight, and exercise? Eat a healthy diet  Eat a diet that includes plenty of vegetables, fruits, low-fat dairy products, and lean protein. Do not eat a lot of foods that are high in solid fats, added sugars, or sodium. Maintain a healthy weight Body mass index (BMI) is used to identify weight problems. It estimates body fat based on height and weight. Your health care provider can help determine your BMI and help you achieve or maintain a healthy weight. Get regular exercise Get regular exercise. This is one of the most important things you can do for your health. Most adults should: Exercise for at least 150 minutes each week. The exercise should increase your heart rate and make you sweat (moderate-intensity exercise). Do strengthening exercises at least twice a week. This is in addition to the moderate-intensity exercise. Spend less time sitting. Even light physical activity can be beneficial. Watch cholesterol and blood lipids Have your blood tested for lipids and cholesterol at 62 years of age, then have this test every 5 years. Have your cholesterol levels checked more often if: Your lipid or cholesterol levels are high. You are older than 62 years of age. You are at high risk for heart disease. What should I know about cancer screening? Depending on your health history and family history, you may need to have cancer screening at various ages. This may include screening for: Breast cancer. Cervical  cancer. Colorectal cancer. Skin cancer. Lung cancer. What should I know about heart disease, diabetes, and high blood pressure? Blood pressure and heart disease High blood pressure causes heart disease and increases the risk of stroke. This is more likely to develop in people who have high blood pressure readings or are overweight. Have your blood pressure checked: Every 3-5 years if you are 13-31 years of age. Every year if you are 32 years old or older. Diabetes Have regular diabetes screenings. This checks your fasting blood sugar level. Have the screening done: Once every three years after age 40 if you are at a normal weight and have a low risk for diabetes. More often and at a younger age if you are overweight or have a high risk for diabetes. What should I know about preventing infection? Hepatitis B If you have a higher risk for hepatitis B, you should be screened for this virus. Talk with your health care provider to find out if you are at risk for hepatitis B infection. Hepatitis C Testing is recommended for: Everyone born from [birth year] through 1965. Anyone with known risk factors for hepatitis C. Sexually transmitted infections (STIs) Get screened for STIs, including gonorrhea and chlamydia, if: You are sexually active and are younger than 62 years of age. You are older than 62 years of age and your health care provider tells you that you are at risk for this type of infection. Your sexual activity has changed since you were last screened, and you are at increased risk for chlamydia or gonorrhea.  Ask your health care provider if you are at risk. Ask your health care provider about whether you are at high risk for HIV. Your health care provider may recommend a prescription medicine to help prevent HIV infection. If you choose to take medicine to prevent HIV, you should first get tested for HIV. You should then be tested every 3 months for as long as you are taking the  medicine. Pregnancy If you are about to stop having your period (premenopausal) and you may become pregnant, seek counseling before you get pregnant. Take 400 to 800 micrograms (mcg) of folic acid every day if you become pregnant. Ask for birth control (contraception) if you want to prevent pregnancy. Osteoporosis and menopause Osteoporosis is a disease in which the bones lose minerals and strength with aging. This can result in bone fractures. If you are 31 years old or older, or if you are at risk for osteoporosis and fractures, ask your health care provider if you should: Be screened for bone loss. Take a calcium  or vitamin D  supplement to lower your risk of fractures. Be given hormone replacement therapy (HRT) to treat symptoms of menopause. Follow these instructions at home: Alcohol use Do not drink alcohol if: Your health care provider tells you not to drink. You are pregnant, may be pregnant, or are planning to become pregnant. If you drink alcohol: Limit how much you have to: 0-1 drink a day. Know how much alcohol is in your drink. In the [place] one drink equals one 12 oz bottle of beer (355 mL), one 5 oz glass of wine (148 mL), or one 1 oz glass of hard liquor (44 mL). Lifestyle Do not use any products that contain nicotine or tobacco. These products include cigarettes, chewing tobacco, and vaping devices, such as e-cigarettes. If you need help quitting, ask your health care provider. Do not use street drugs. Do not share needles. Ask your health care provider for help if you need support or information about quitting drugs. General instructions Schedule regular health, dental, and eye exams. Stay current with your vaccines. Tell your health care provider if: You often feel depressed. You have ever been abused or do not feel safe at home. Summary Adopting a healthy lifestyle and getting preventive care are important in promoting health and wellness. Follow your health care  provider's instructions about healthy diet, exercising, and getting tested or screened for diseases. Follow your health care provider's instructions on monitoring your cholesterol and blood pressure. This information is not intended to replace advice given to you by your health care provider. Make sure you discuss any questions you have with your health care provider. Document Revised: 01/18/2021 Document Reviewed: 01/18/2021 Elsevier Patient Education  2024 [employer].

## 2023-09-27 ENCOUNTER — Ambulatory Visit: Payer: BC Managed Care – PPO | Admitting: Internal Medicine

## 2023-09-27 VITALS — BP 136/74 | HR 59 | Temp 98.4°F | Ht 63.0 in | Wt 180.0 lb

## 2023-09-27 DIAGNOSIS — E559 Vitamin D deficiency, unspecified: Secondary | ICD-10-CM | POA: Diagnosis not present

## 2023-09-27 DIAGNOSIS — R7303 Prediabetes: Secondary | ICD-10-CM

## 2023-09-27 DIAGNOSIS — M8589 Other specified disorders of bone density and structure, multiple sites: Secondary | ICD-10-CM

## 2023-09-27 DIAGNOSIS — E039 Hypothyroidism, unspecified: Secondary | ICD-10-CM | POA: Diagnosis not present

## 2023-09-27 DIAGNOSIS — I10 Essential (primary) hypertension: Secondary | ICD-10-CM

## 2023-09-27 DIAGNOSIS — E538 Deficiency of other specified B group vitamins: Secondary | ICD-10-CM

## 2023-09-27 DIAGNOSIS — F3289 Other specified depressive episodes: Secondary | ICD-10-CM

## 2023-09-27 DIAGNOSIS — F411 Generalized anxiety disorder: Secondary | ICD-10-CM

## 2023-09-27 DIAGNOSIS — Z0000 Encounter for general adult medical examination without abnormal findings: Secondary | ICD-10-CM

## 2023-09-27 DIAGNOSIS — G479 Sleep disorder, unspecified: Secondary | ICD-10-CM

## 2023-09-27 DIAGNOSIS — M3500 Sicca syndrome, unspecified: Secondary | ICD-10-CM

## 2023-09-27 DIAGNOSIS — Z79890 Hormone replacement therapy (postmenopausal): Secondary | ICD-10-CM

## 2023-09-27 DIAGNOSIS — E7849 Other hyperlipidemia: Secondary | ICD-10-CM | POA: Diagnosis not present

## 2023-09-27 DIAGNOSIS — M797 Fibromyalgia: Secondary | ICD-10-CM

## 2023-09-27 DIAGNOSIS — G43109 Migraine with aura, not intractable, without status migrainosus: Secondary | ICD-10-CM

## 2023-09-27 DIAGNOSIS — Z85038 Personal history of other malignant neoplasm of large intestine: Secondary | ICD-10-CM

## 2023-09-27 LAB — COMPREHENSIVE METABOLIC PANEL
ALT: 21 U/L (ref 0–35)
AST: 21 U/L (ref 0–37)
Albumin: 4.7 g/dL (ref 3.5–5.2)
Alkaline Phosphatase: 61 U/L (ref 39–117)
BUN: 12 mg/dL (ref 6–23)
CO2: 28 meq/L (ref 19–32)
Calcium: 9.7 mg/dL (ref 8.4–10.5)
Chloride: 103 meq/L (ref 96–112)
Creatinine, Ser: 0.74 mg/dL (ref 0.40–1.20)
GFR: 87.14 mL/min (ref >60.00)
Glucose, Bld: 71 mg/dL (ref 70–99)
Potassium: 3.7 meq/L (ref 3.5–5.1)
Sodium: 139 meq/L (ref 135–145)
Total Bilirubin: 0.4 mg/dL (ref 0.2–1.2)
Total Protein: 7.9 g/dL (ref 6.0–8.3)

## 2023-09-27 LAB — VITAMIN B12: Vitamin B-12: 526 pg/mL (ref 211–911)

## 2023-09-27 LAB — LIPID PANEL
Cholesterol: 204 mg/dL — ABNORMAL HIGH (ref 0–200)
HDL: 61.9 mg/dL (ref >39.00)
LDL Cholesterol: 120 mg/dL — ABNORMAL HIGH (ref 0–99)
NonHDL: 141.84
Total CHOL/HDL Ratio: 3
Triglycerides: 108 mg/dL (ref 0.0–149.0)
VLDL: 21.6 mg/dL (ref 0.0–40.0)

## 2023-09-27 LAB — CBC WITH DIFFERENTIAL/PLATELET
Basophils Absolute: 0 10*3/uL (ref 0.0–0.1)
Basophils Relative: 0.2 % (ref 0.0–3.0)
Eosinophils Absolute: 0 10*3/uL (ref 0.0–0.7)
Eosinophils Relative: 1.1 % (ref 0.0–5.0)
HCT: 43.7 % (ref 36.0–46.0)
Hemoglobin: 14.4 g/dL (ref 12.0–15.0)
Lymphocytes Relative: 49.3 % — ABNORMAL HIGH (ref 12.0–46.0)
Lymphs Abs: 2.1 10*3/uL (ref 0.7–4.0)
MCHC: 33 g/dL (ref 30.0–36.0)
MCV: 97.1 fL (ref 78.0–100.0)
Monocytes Absolute: 0.4 10*3/uL (ref 0.1–1.0)
Monocytes Relative: 9.7 % (ref 3.0–12.0)
Neutro Abs: 1.7 10*3/uL (ref 1.4–7.7)
Neutrophils Relative %: 39.7 % — ABNORMAL LOW (ref 43.0–77.0)
Platelets: 280 10*3/uL (ref 150.0–400.0)
RBC: 4.5 Mil/uL (ref 3.87–5.11)
RDW: 13.8 % (ref 11.5–15.5)
WBC: 4.4 10*3/uL (ref 4.0–10.5)

## 2023-09-27 LAB — HEMOGLOBIN A1C: Hgb A1c MFr Bld: 5.8 % (ref 4.6–6.5)

## 2023-09-27 LAB — TSH: TSH: 5.47 u[IU]/mL (ref 0.35–5.50)

## 2023-09-27 LAB — VITAMIN D 25 HYDROXY (VIT D DEFICIENCY, FRACTURES): VITD: 25.51 ng/mL — ABNORMAL LOW (ref 30.00–100.00)

## 2023-09-27 MED ORDER — ROSUVASTATIN CALCIUM 20 MG PO TABS: 20.0000 mg | ORAL_TABLET | Freq: Every day | ORAL | 1 refills | Status: DC

## 2023-09-27 MED ORDER — ESCITALOPRAM OXALATE 20 MG PO TABS: 20.0000 mg | ORAL_TABLET | Freq: Every day | ORAL | 1 refills | Status: DC

## 2023-09-27 NOTE — Assessment & Plan Note (Signed)
Chronic  Clinically euthyroid Currently taking levothyroxine 50 mcg daily Check tsh  Titrate med dose if needed  

## 2023-09-27 NOTE — Assessment & Plan Note (Signed)
 Chronic Blood pressure controlled continue losartan  to 100 mg daily CMP, CBC

## 2023-09-27 NOTE — Assessment & Plan Note (Signed)
Chronic Check a1c Low sugar / carb diet Continue regular exercise 

## 2023-09-27 NOTE — Assessment & Plan Note (Signed)
Chronic ?Controlled, Stable ?Continue Lexapro 20 mg daily ?

## 2023-09-27 NOTE — Assessment & Plan Note (Signed)
Chronic Regular exercise and healthy diet encouraged Check lipid panel  Continue Crestor 20 mg daily 

## 2023-09-27 NOTE — Assessment & Plan Note (Signed)
Chronic Taking B12 Check B12 level

## 2023-09-27 NOTE — Assessment & Plan Note (Signed)
 Chronic DEXA due - scheduled Stressed regular exercise Continue calcium  and vitamin D  daily Check vitamin D  level

## 2023-09-27 NOTE — Assessment & Plan Note (Signed)
 Chronic Not on any medication Symptomatic treatment dry mouth/eyes

## 2023-09-27 NOTE — Assessment & Plan Note (Signed)
 Chronic Controlled  Continue trazodone  to 75mg  daily

## 2023-09-27 NOTE — Assessment & Plan Note (Signed)
 Chronic Rare migraines Continue nurtec 75 mg daily prn - this does work well

## 2023-09-27 NOTE — Assessment & Plan Note (Signed)
 Chronic Taking vitamin D daily Check vitamin D level

## 2023-09-27 NOTE — Assessment & Plan Note (Addendum)
 Chronic Symptoms controlled - denies hotflashes Continue estradiol  0.05 mg / 24 hr - when she went off of it she had hotflashes again - they may have come back with gaining back the weight

## 2023-09-27 NOTE — Assessment & Plan Note (Signed)
Chronic Stressed regular exercise, stretching Continue tizanidine 4 mg bid prn  -usually takes once a day

## 2023-09-27 NOTE — Assessment & Plan Note (Signed)
 History of colon cancer Removed completely by polypectomy 06/2015 Colonoscopy is up-to-date-due the end of next year

## 2023-10-05 ENCOUNTER — Encounter: Payer: Self-pay | Admitting: Internal Medicine

## 2023-11-08 ENCOUNTER — Ambulatory Visit
Admission: RE | Admit: 2023-11-08 | Discharge: 2023-11-08 | Disposition: A | Discharge status: Home / Self Care | Payer: BC Managed Care – PPO | Source: Ambulatory Visit | Attending: Internal Medicine | Admitting: Internal Medicine

## 2023-11-08 DIAGNOSIS — M8589 Other specified disorders of bone density and structure, multiple sites: Secondary | ICD-10-CM

## 2023-11-09 ENCOUNTER — Encounter: Payer: Self-pay | Admitting: Internal Medicine

## 2024-01-07 ENCOUNTER — Other Ambulatory Visit: Payer: Self-pay | Admitting: Internal Medicine

## 2024-01-23 ENCOUNTER — Other Ambulatory Visit: Payer: Self-pay | Admitting: Internal Medicine

## 2024-02-12 ENCOUNTER — Other Ambulatory Visit: Payer: Self-pay | Admitting: Internal Medicine

## 2024-03-18 ENCOUNTER — Other Ambulatory Visit: Payer: Self-pay | Admitting: Internal Medicine

## 2024-03-27 ENCOUNTER — Ambulatory Visit: Payer: BC Managed Care – PPO | Admitting: Internal Medicine

## 2024-04-04 ENCOUNTER — Encounter: Payer: Self-pay | Admitting: Internal Medicine

## 2024-04-04 NOTE — Patient Instructions (Addendum)
   Schedule a mammogram.    Blood work was ordered.       Medications changes include :   fosamax 70 mg weekly    A Ct scan of your heart was ordered and someone will call you to schedule an appointment.     Return in about 6 months (around 10/06/2024) for Physical Exam.

## 2024-04-04 NOTE — Progress Notes (Unsigned)
 Subjective:    Patient ID: Margaret Green, female    DOB: October 13, 1960, 63 y.o.   MRN: [id number]     HPI Margaret Green is here for follow up of her chronic medical problems.  Gets tired easily.    No concerns physically or mentally   Medications and allergies reviewed with patient and updated if appropriate.  Current Outpatient Medications on File Prior to Visit  Medication Sig Dispense Refill   Azelastine -Fluticasone  137-50 MCG/ACT SUSP Place 2 sprays into both nostrils daily. 23 g 5   Calcium  Carb-Cholecalciferol 600-10 MG-MCG TABS Take 1 tablet by mouth daily.     clotrimazole  (CLOTRIMAZOLE  ANTI-FUNGAL) 1 % cream Apply 1 application topically 2 (two) times daily. (Patient taking differently: Apply 1 application  topically as needed.) 30 g 0   diphenoxylate -atropine  (LOMOTIL ) 2.5-0.025 MG tablet Take 1 tablet by mouth 4 (four) times daily as needed for diarrhea or loose stools. 30 tablet 0   escitalopram  (LEXAPRO ) 20 MG tablet Take 1 tablet (20 mg total) by mouth daily. 90 tablet 1   estradiol  (DOTTI ) 0.05 MG/24HR patch PLACE 1 PATCH (0.05 MG TOTAL) ONTO THE SKIN 2 (TWO) TIMES A WEEK - WEDNESDAY AND SUNDAY. 24 patch 0   fluticasone  (FLONASE ) 50 MCG/ACT nasal spray INSTILL 2 SPRAYS INTO BOTH NOSTRILS DAILY. 16 g 2   ibuprofen  (ADVIL ) 600 MG tablet Take 1 tablet (600 mg total) by mouth every 8 (eight) hours as needed for moderate pain. Take with food 30 tablet 0   levothyroxine  (SYNTHROID ) 50 MCG tablet TAKE ONE TABLET BY MOUTH DAILY 90 tablet 1   losartan  (COZAAR ) 100 MG tablet Take 1 tablet (100 mg total) by mouth daily. 90 tablet 2   meloxicam  (MOBIC ) 15 MG tablet Take 1 tablet (15 mg total) by mouth daily. 30 tablet 0   Rimegepant Sulfate (NURTEC) 75 MG TBDP Take 1 tablet (75 mg total) by mouth daily as needed. 8 tablet 3   rosuvastatin  (CRESTOR ) 20 MG tablet Take 1 tablet (20 mg total) by mouth daily. 90 tablet 2   tiZANidine  (ZANAFLEX ) 4 MG tablet TAKE ONE TABLET BY  MOUTH TWICE DAILY AS NEEDED FOR MUSCLE SPASMS 180 tablet 1   traZODone  (DESYREL ) 50 MG tablet Take 1.5 tablets (75 mg total) by mouth at bedtime. 135 tablet 1   TYRVAYA 0.03 MG/ACT SOLN SMARTSIG:1 Spray(s) Both Nares Twice a Week     UNIFINE PENTIPS 31G X 6 MM MISC USE AS DIRECTED WITH SAXENDA      No current facility-administered medications on file prior to visit.     Review of Systems  Constitutional:  Negative for fever.  Respiratory:  Negative for cough, shortness of breath and wheezing.   Cardiovascular:  Positive for palpitations (occ). Negative for chest pain and leg swelling.  Neurological:  Negative for light-headedness and headaches.  Psychiatric/Behavioral:  Positive for sleep disturbance (pretty well controlled).        Objective:   Vitals:   04/05/24 1510  BP: 132/76  Pulse: 80  Temp: 98.4 F (36.9 C)  SpO2: 98%   BP Readings from Last 3 Encounters:  04/05/24 132/76  09/27/23 136/74  02/24/23 (!) 140/80   Wt Readings from Last 3 Encounters:  04/05/24 188 lb (85.3 kg)  09/27/23 180 lb (81.6 kg)  02/24/23 167 lb (75.8 kg)   Body mass index is 33.3 kg/m.    Physical Exam Constitutional:      General: She is not in acute distress.  Appearance: Normal appearance.  HENT:     Head: Normocephalic and atraumatic.  Eyes:     Conjunctiva/sclera: Conjunctivae normal.  Cardiovascular:     Rate and Rhythm: Normal rate and regular rhythm.     Heart sounds: Normal heart sounds.  Pulmonary:     Effort: Pulmonary effort is normal. No respiratory distress.     Breath sounds: Normal breath sounds. No wheezing.  Musculoskeletal:     Cervical back: Neck supple.     Right lower leg: No edema.     Left lower leg: No edema.  Lymphadenopathy:     Cervical: No cervical adenopathy.  Skin:    General: Skin is warm and dry.     Findings: No rash.  Neurological:     Mental Status: She is alert. Mental status is at baseline.  Psychiatric:        Mood and Affect:  Mood normal.        Behavior: Behavior normal.        Lab Results  Component Value Date   WBC 4.4 09/27/2023   HGB 14.4 09/27/2023   HCT 43.7 09/27/2023   PLT 280.0 09/27/2023   GLUCOSE 71 09/27/2023   CHOL 204 (H) 09/27/2023   TRIG 108.0 09/27/2023   HDL 61.90 09/27/2023   LDLDIRECT 103.0 02/05/2019   LDLCALC 120 (H) 09/27/2023   ALT 21 09/27/2023   AST 21 09/27/2023   NA 139 09/27/2023   K 3.7 09/27/2023   CL 103 09/27/2023   CREATININE 0.74 09/27/2023   BUN 12 09/27/2023   CO2 28 09/27/2023   TSH 5.47 09/27/2023   HGBA1C 5.8 09/27/2023     Assessment & Plan:    See Problem List for Assessment and Plan of chronic medical problems.

## 2024-04-05 ENCOUNTER — Ambulatory Visit: Admitting: Internal Medicine

## 2024-04-05 VITALS — BP 132/76 | HR 80 | Temp 98.4°F | Ht 63.0 in | Wt 188.0 lb

## 2024-04-05 DIAGNOSIS — Z79890 Hormone replacement therapy (postmenopausal): Secondary | ICD-10-CM

## 2024-04-05 DIAGNOSIS — G43109 Migraine with aura, not intractable, without status migrainosus: Secondary | ICD-10-CM

## 2024-04-05 DIAGNOSIS — M3500 Sicca syndrome, unspecified: Secondary | ICD-10-CM | POA: Diagnosis not present

## 2024-04-05 DIAGNOSIS — E559 Vitamin D deficiency, unspecified: Secondary | ICD-10-CM

## 2024-04-05 DIAGNOSIS — M797 Fibromyalgia: Secondary | ICD-10-CM

## 2024-04-05 DIAGNOSIS — R7303 Prediabetes: Secondary | ICD-10-CM | POA: Diagnosis not present

## 2024-04-05 DIAGNOSIS — E7849 Other hyperlipidemia: Secondary | ICD-10-CM | POA: Diagnosis not present

## 2024-04-05 DIAGNOSIS — I10 Essential (primary) hypertension: Secondary | ICD-10-CM | POA: Diagnosis not present

## 2024-04-05 DIAGNOSIS — E538 Deficiency of other specified B group vitamins: Secondary | ICD-10-CM

## 2024-04-05 DIAGNOSIS — Z136 Encounter for screening for cardiovascular disorders: Secondary | ICD-10-CM

## 2024-04-05 DIAGNOSIS — G479 Sleep disorder, unspecified: Secondary | ICD-10-CM

## 2024-04-05 DIAGNOSIS — F3289 Other specified depressive episodes: Secondary | ICD-10-CM

## 2024-04-05 DIAGNOSIS — E039 Hypothyroidism, unspecified: Secondary | ICD-10-CM

## 2024-04-05 DIAGNOSIS — M810 Age-related osteoporosis without current pathological fracture: Secondary | ICD-10-CM

## 2024-04-05 LAB — LIPID PANEL
Cholesterol: 199 mg/dL (ref 0–200)
HDL: 60.5 mg/dL (ref >39.00)
LDL Cholesterol: 105 mg/dL — ABNORMAL HIGH (ref 0–99)
NonHDL: 138.26
Total CHOL/HDL Ratio: 3
Triglycerides: 166 mg/dL — ABNORMAL HIGH (ref 0.0–149.0)
VLDL: 33.2 mg/dL (ref 0.0–40.0)

## 2024-04-05 LAB — COMPREHENSIVE METABOLIC PANEL WITH GFR
ALT: 20 U/L (ref 0–35)
AST: 20 U/L (ref 0–37)
Albumin: 4.2 g/dL (ref 3.5–5.2)
Alkaline Phosphatase: 50 U/L (ref 39–117)
BUN: 14 mg/dL (ref 6–23)
CO2: 28 meq/L (ref 19–32)
Calcium: 9.1 mg/dL (ref 8.4–10.5)
Chloride: 106 meq/L (ref 96–112)
Creatinine, Ser: 0.8 mg/dL (ref 0.40–1.20)
GFR: 79.07 mL/min (ref >60.00)
Glucose, Bld: 91 mg/dL (ref 70–99)
Potassium: 3.8 meq/L (ref 3.5–5.1)
Sodium: 140 meq/L (ref 135–145)
Total Bilirubin: 0.2 mg/dL (ref 0.2–1.2)
Total Protein: 7.2 g/dL (ref 6.0–8.3)

## 2024-04-05 LAB — CBC WITH DIFFERENTIAL/PLATELET
Basophils Absolute: 0 K/uL (ref 0.0–0.1)
Basophils Relative: 0.2 % (ref 0.0–3.0)
Eosinophils Absolute: 0 K/uL (ref 0.0–0.7)
Eosinophils Relative: 0.9 % (ref 0.0–5.0)
HCT: 38.2 % (ref 36.0–46.0)
Hemoglobin: 12.8 g/dL (ref 12.0–15.0)
Lymphocytes Relative: 41.4 % (ref 12.0–46.0)
Lymphs Abs: 2.1 K/uL (ref 0.7–4.0)
MCHC: 33.5 g/dL (ref 30.0–36.0)
MCV: 94.8 fl (ref 78.0–100.0)
Monocytes Absolute: 0.3 K/uL (ref 0.1–1.0)
Monocytes Relative: 6.1 % (ref 3.0–12.0)
Neutro Abs: 2.6 K/uL (ref 1.4–7.7)
Neutrophils Relative %: 51.4 % (ref 43.0–77.0)
Platelets: 324 K/uL (ref 150.0–400.0)
RBC: 4.03 Mil/uL (ref 3.87–5.11)
RDW: 13.7 % (ref 11.5–15.5)
WBC: 5 K/uL (ref 4.0–10.5)

## 2024-04-05 LAB — HEMOGLOBIN A1C: Hgb A1c MFr Bld: 5.9 % (ref 4.6–6.5)

## 2024-04-05 LAB — SEDIMENTATION RATE: Sed Rate: 33 mm/h — ABNORMAL HIGH (ref 0–30)

## 2024-04-05 LAB — C-REACTIVE PROTEIN: CRP: <1 mg/dL (ref 0.5–20.0)

## 2024-04-05 LAB — TSH: TSH: 3.84 u[IU]/mL (ref 0.35–5.50)

## 2024-04-05 MED ORDER — ALENDRONATE SODIUM 70 MG PO TABS: 70.0000 mg | ORAL_TABLET | ORAL | 3 refills | Status: AC

## 2024-04-05 NOTE — Assessment & Plan Note (Signed)
Chronic ?Controlled, Stable ?Continue Lexapro 20 mg daily ?

## 2024-04-05 NOTE — Assessment & Plan Note (Signed)
Chronic  Clinically euthyroid Currently taking levothyroxine 50 mcg daily Check tsh  Titrate med dose if needed  

## 2024-04-05 NOTE — Assessment & Plan Note (Signed)
 Chronic Rare migraines Continue nurtec 75 mg daily prn - this does work well

## 2024-04-05 NOTE — Assessment & Plan Note (Signed)
Chronic Stressed regular exercise, stretching Continue tizanidine 4 mg bid prn  -usually takes once a day

## 2024-04-05 NOTE — Assessment & Plan Note (Signed)
 Chronic Taking vitamin D daily Check vitamin D level

## 2024-04-05 NOTE — Assessment & Plan Note (Signed)
Chronic Regular exercise and healthy diet encouraged Check lipid panel  Continue Crestor 20 mg daily 

## 2024-04-05 NOTE — Assessment & Plan Note (Signed)
 Chronic Controlled  Continue trazodone  to 75mg  daily

## 2024-04-05 NOTE — Assessment & Plan Note (Signed)
 Chronic Not currently following with rheumatology Does have significant sicca symptoms Will check Sjogren antibodies, ESR, CRP, ANA

## 2024-04-05 NOTE — Assessment & Plan Note (Signed)
 Chronic Symptoms controlled - denies hotflashes Continue estradiol  0.05 mg / 24 hr

## 2024-04-05 NOTE — Assessment & Plan Note (Signed)
 Chronic Blood pressure controlled continue losartan  to 100 mg daily CMP, CBC

## 2024-04-05 NOTE — Assessment & Plan Note (Signed)
 Chronic Reviewed last DEXA scan-needs treatment Start Fosamax-she did take this previous and is not sure if she tolerated or not, but is willing to retry.  Discussed possible side effect of reflux Stressed regular exercise Continue calcium  and vitamin D  daily Check vitamin D  level

## 2024-04-05 NOTE — Assessment & Plan Note (Signed)
 Chronic Lab Results  Component Value Date   HGBA1C 5.8 09/27/2023   Check a1c Low sugar / carb diet Continue regular exercise

## 2024-04-05 NOTE — Assessment & Plan Note (Signed)
Chronic Taking B12 Check B12 level

## 2024-04-07 LAB — ANA: Anti Nuclear Antibody (ANA): POSITIVE — AB

## 2024-04-07 LAB — ANTI-NUCLEAR AB-TITER (ANA TITER): ANA Titer 1: 1:320 {titer} — ABNORMAL HIGH

## 2024-04-07 LAB — SJOGRENS SYNDROME-B EXTRACTABLE NUCLEAR ANTIBODY: SSB (La) (ENA) Antibody, IgG: <1 AI

## 2024-04-07 LAB — SJOGRENS SYNDROME-A EXTRACTABLE NUCLEAR ANTIBODY: SSA (Ro) (ENA) Antibody, IgG: >8 AI — AB

## 2024-04-08 ENCOUNTER — Ambulatory Visit: Payer: Self-pay | Admitting: Internal Medicine

## 2024-04-08 LAB — VITAMIN D 25 HYDROXY (VIT D DEFICIENCY, FRACTURES): VITD: 18.24 ng/mL — ABNORMAL LOW (ref 30.00–100.00)

## 2024-04-11 ENCOUNTER — Telehealth: Payer: Self-pay

## 2024-04-11 ENCOUNTER — Other Ambulatory Visit (HOSPITAL_COMMUNITY): Payer: Self-pay

## 2024-04-11 NOTE — Telephone Encounter (Signed)
 Pharmacy Patient Advocate Encounter   Received notification from CoverMyMeds that prior authorization for Nurtec 75MG  dispersible tablets  is required/requested.   Insurance verification completed.   The patient is insured through [facility] .   Per test claim: PA required; PA submitted to above mentioned insurance via CoverMyMeds Key/confirmation #/EOC [id number] Status is pending

## 2024-04-12 ENCOUNTER — Other Ambulatory Visit (HOSPITAL_COMMUNITY): Payer: Self-pay

## 2024-04-12 NOTE — Telephone Encounter (Signed)
 Pharmacy Patient Advocate Encounter  Received notification from [facility] that Prior Authorization for Nurtec 75MG  dispersible tablets  has been APPROVED from 04/11/24 to 04/11/25. Ran test claim, Copay is $0. This test claim was processed through [facility]- copay amounts may vary at other pharmacies due to pharmacy/plan contracts, or as the patient moves through the different stages of their insurance plan.   PA #/Case ID/Reference #: [id number]

## 2024-04-16 ENCOUNTER — Other Ambulatory Visit: Payer: Self-pay

## 2024-04-16 DIAGNOSIS — G43011 Migraine without aura, intractable, with status migrainosus: Secondary | ICD-10-CM

## 2024-04-16 MED ORDER — NURTEC 75 MG PO TBDP: 1.0000 | ORAL_TABLET | Freq: Every day | ORAL | 3 refills | Status: AC | PRN

## 2024-04-18 ENCOUNTER — Encounter: Payer: Self-pay | Admitting: Internal Medicine

## 2024-04-28 ENCOUNTER — Encounter: Payer: Self-pay | Admitting: Internal Medicine

## 2024-04-28 DIAGNOSIS — F419 Anxiety disorder, unspecified: Secondary | ICD-10-CM | POA: Insufficient documentation

## 2024-04-28 NOTE — Progress Notes (Unsigned)
    Subjective:    Patient ID: Margaret Green, female    DOB: 12-31-1960, 63 y.o.   MRN: [id number]      HPI Bracha is here for No chief complaint on file.        Medications and allergies reviewed with patient and updated if appropriate.  Current Outpatient Medications on File Prior to Visit  Medication Sig Dispense Refill   alendronate  (FOSAMAX ) 70 MG tablet Take 1 tablet (70 mg total) by mouth every 7 (seven) days. Take with a full glass of water on an empty stomach. 12 tablet 3   Azelastine -Fluticasone  137-50 MCG/ACT SUSP Place 2 sprays into both nostrils daily. 23 g 5   Calcium  Carb-Cholecalciferol 600-10 MG-MCG TABS Take 1 tablet by mouth daily.     clotrimazole  (CLOTRIMAZOLE  ANTI-FUNGAL) 1 % cream Apply 1 application topically 2 (two) times daily. (Patient taking differently: Apply 1 application  topically as needed.) 30 g 0   diphenoxylate -atropine  (LOMOTIL ) 2.5-0.025 MG tablet Take 1 tablet by mouth 4 (four) times daily as needed for diarrhea or loose stools. 30 tablet 0   escitalopram  (LEXAPRO ) 20 MG tablet Take 1 tablet (20 mg total) by mouth daily. 90 tablet 1   estradiol  (DOTTI ) 0.05 MG/24HR patch PLACE 1 PATCH (0.05 MG TOTAL) ONTO THE SKIN 2 (TWO) TIMES A WEEK - WEDNESDAY AND SUNDAY. 24 patch 0   fluticasone  (FLONASE ) 50 MCG/ACT nasal spray INSTILL 2 SPRAYS INTO BOTH NOSTRILS DAILY. 16 g 2   ibuprofen  (ADVIL ) 600 MG tablet Take 1 tablet (600 mg total) by mouth every 8 (eight) hours as needed for moderate pain. Take with food 30 tablet 0   levothyroxine  (SYNTHROID ) 50 MCG tablet TAKE ONE TABLET BY MOUTH DAILY 90 tablet 1   losartan  (COZAAR ) 100 MG tablet Take 1 tablet (100 mg total) by mouth daily. 90 tablet 2   meloxicam  (MOBIC ) 15 MG tablet Take 1 tablet (15 mg total) by mouth daily. 30 tablet 0   Rimegepant Sulfate (NURTEC) 75 MG TBDP Take 1 tablet (75 mg total) by mouth daily as needed. 8 tablet 3   rosuvastatin  (CRESTOR ) 20 MG tablet Take 1 tablet (20 mg  total) by mouth daily. 90 tablet 2   tiZANidine  (ZANAFLEX ) 4 MG tablet TAKE ONE TABLET BY MOUTH TWICE DAILY AS NEEDED FOR MUSCLE SPASMS 180 tablet 1   traZODone  (DESYREL ) 50 MG tablet Take 1.5 tablets (75 mg total) by mouth at bedtime. 135 tablet 1   TYRVAYA 0.03 MG/ACT SOLN SMARTSIG:1 Spray(s) Both Nares Twice a Week     UNIFINE PENTIPS 31G X 6 MM MISC USE AS DIRECTED WITH SAXENDA      No current facility-administered medications on file prior to visit.    Review of Systems     Objective:  There were no vitals filed for this visit. BP Readings from Last 3 Encounters:  04/05/24 132/76  09/27/23 136/74  02/24/23 (!) 140/80   Wt Readings from Last 3 Encounters:  04/05/24 188 lb (85.3 kg)  09/27/23 180 lb (81.6 kg)  02/24/23 167 lb (75.8 kg)   There is no height or weight on file to calculate BMI.    Physical Exam         Assessment & Plan:    See Problem List for Assessment and Plan of chronic medical problems.

## 2024-04-28 NOTE — Patient Instructions (Incomplete)
     Medications changes include :   buspirone  5 mg three times a day as needed      Return for follow up as scheduled.

## 2024-04-29 ENCOUNTER — Ambulatory Visit: Admitting: Internal Medicine

## 2024-04-29 VITALS — BP 124/78 | HR 59 | Temp 98.6°F | Ht 63.0 in | Wt 189.0 lb

## 2024-04-29 DIAGNOSIS — F3289 Other specified depressive episodes: Secondary | ICD-10-CM

## 2024-04-29 DIAGNOSIS — F411 Generalized anxiety disorder: Secondary | ICD-10-CM

## 2024-04-29 DIAGNOSIS — F419 Anxiety disorder, unspecified: Secondary | ICD-10-CM

## 2024-04-29 MED ORDER — BUSPIRONE HCL 5 MG PO TABS: 5.0000 mg | ORAL_TABLET | Freq: Three times a day (TID) | ORAL | 5 refills | Status: AC | PRN

## 2024-04-29 MED ORDER — DIPHENOXYLATE-ATROPINE 2.5-0.025 MG PO TABS: 1.0000 | ORAL_TABLET | Freq: Four times a day (QID) | ORAL | 0 refills | Status: AC | PRN

## 2024-04-29 NOTE — Assessment & Plan Note (Signed)
 Chronic Controlled, Stable, but having increased anxiety/stress Continue Lexapro  20 mg daily

## 2024-04-29 NOTE — Assessment & Plan Note (Signed)
 Chronic Increased recently - stress at work Discussed options Continue Lexapro  20 mg daily Start buspar  5 mg tid prn

## 2024-06-03 ENCOUNTER — Encounter: Payer: Self-pay | Admitting: Gastroenterology

## 2024-07-19 ENCOUNTER — Encounter: Payer: Self-pay | Admitting: Internal Medicine

## 2024-07-21 ENCOUNTER — Other Ambulatory Visit: Payer: Self-pay | Admitting: Internal Medicine

## 2024-08-20 ENCOUNTER — Encounter: Payer: Self-pay | Admitting: Gastroenterology

## 2024-08-20 ENCOUNTER — Ambulatory Visit: Admitting: *Deleted

## 2024-08-20 VITALS — Ht 63.0 in | Wt 185.0 lb

## 2024-08-20 DIAGNOSIS — Z83711 Family history of hyperplastic colon polyps: Secondary | ICD-10-CM

## 2024-08-20 DIAGNOSIS — Z85038 Personal history of other malignant neoplasm of large intestine: Secondary | ICD-10-CM

## 2024-08-20 DIAGNOSIS — Z860100 Personal history of colon polyps, unspecified: Secondary | ICD-10-CM

## 2024-08-20 MED ORDER — NA SULFATE-K SULFATE-MG SULF 17.5-3.13-1.6 GM/177ML PO SOLN: 1.0000 | Freq: Once | ORAL | 0 refills | Status: AC

## 2024-08-20 NOTE — Progress Notes (Signed)
 Pt's name and DOB verified at the beginning of the pre-visit with 2 identifiers  Pt denies any difficulty with ambulating,sitting, laying down or rolling side to side  Pt has no issues moving head neck or swallowing  No egg or soy allergy known to patient   No issues known to pt with past sedation  No FH of Malignant Hyperthermia  Pt is not on home 02   Pt is not on blood thinners   Pt denies issues with constipation    Pt is not on dialysis  Pt  has hx of heart murmur and denise any abnormal heart rhythms   Pt denies any upcoming cardiac testing  Patient's chart reviewed by Norleen Schillings CNRA prior to pre-visit and patient appropriate for the [facility].  Pre-visit completed and red dot placed by patient's name on their procedure day (on provider's schedule).    Visit by phone  Pt states weight is 180 lb   Pt given  both [facility] main # and MD on call # prior to instructions.  Informed pt to come in at the time discussed and is shown on PV instructions.  Pt instructed to use Single[employer] or GoodRx for a price reduction on prep  Instructed pt where to find PV instructions in My Chart. . Instructed pt on all aspects of written instructions including med holds clothing to wear and foods to eat and not eat as well as after procedure legal restrictions and to call MD on call if needed.. Pt states understanding. Instructed pt to review instructions again prior to procedure and call main # given if has any questions or any issues. Pt states they will.

## 2024-08-23 ENCOUNTER — Ambulatory Visit (HOSPITAL_COMMUNITY): Admission: RE | Admit: 2024-08-23 | Discharge: 2024-08-23 | Payer: Self-pay | Attending: Internal Medicine

## 2024-08-23 DIAGNOSIS — Z136 Encounter for screening for cardiovascular disorders: Secondary | ICD-10-CM

## 2024-08-30 ENCOUNTER — Ambulatory Visit (AMBULATORY_SURGERY_CENTER): Admitting: Gastroenterology

## 2024-08-30 ENCOUNTER — Encounter: Payer: Self-pay | Admitting: Gastroenterology

## 2024-08-30 VITALS — BP 145/78 | HR 65 | Temp 97.5°F | Resp 19 | Ht 63.0 in | Wt 185.0 lb

## 2024-08-30 DIAGNOSIS — K5730 Diverticulosis of large intestine without perforation or abscess without bleeding: Secondary | ICD-10-CM

## 2024-08-30 DIAGNOSIS — Z1211 Encounter for screening for malignant neoplasm of colon: Secondary | ICD-10-CM | POA: Diagnosis present

## 2024-08-30 DIAGNOSIS — K648 Other hemorrhoids: Secondary | ICD-10-CM | POA: Diagnosis not present

## 2024-08-30 DIAGNOSIS — Z85038 Personal history of other malignant neoplasm of large intestine: Secondary | ICD-10-CM | POA: Diagnosis not present

## 2024-08-30 DIAGNOSIS — Z860100 Personal history of colon polyps, unspecified: Secondary | ICD-10-CM | POA: Diagnosis not present

## 2024-08-30 DIAGNOSIS — D123 Benign neoplasm of transverse colon: Secondary | ICD-10-CM

## 2024-08-30 DIAGNOSIS — K635 Polyp of colon: Secondary | ICD-10-CM | POA: Diagnosis not present

## 2024-08-30 MED ORDER — SODIUM CHLORIDE 0.9 % IV SOLN: 500.0000 mL | Freq: Once | INTRAVENOUS | Status: DC

## 2024-08-30 NOTE — Op Note (Signed)
 [facility] Patient Name: Margaret Green Procedure Date: 08/30/2024 9:41 AM MRN: [id number] Endoscopist: Elspeth P. Leigh , MD, [id number] Age: 63 Referring MD:  Date of Birth: 1961/08/23 Gender: Female Account #: [id number] Procedure:                Colonoscopy Indications:              High risk colon cancer surveillance: Personal                            history of colonic polyps - history of malignant                            polyp removed 2016, last exam 2020 Medicines:                Monitored Anesthesia Care Procedure:                Pre-Anesthesia Assessment:                           - Prior to the procedure, a History and Physical                            was performed, and patient medications and                            allergies were reviewed. The patient's tolerance of                            previous anesthesia was also reviewed. The risks                            and benefits of the procedure and the sedation                            options and risks were discussed with the patient.                            All questions were answered, and informed consent                            was obtained. Prior Anticoagulants: The patient has                            taken no anticoagulant or antiplatelet agents. ASA                            Grade Assessment: II - A patient with mild systemic                            disease. After reviewing the risks and benefits,                            the patient was deemed in satisfactory condition to  undergo the procedure.                           After obtaining informed consent, the colonoscope                            was passed under direct vision. Throughout the                            procedure, the patient's blood pressure, pulse, and                            oxygen saturations were monitored continuously. The                            Olympus Scope SN  [id number] was introduced through the                            anus and advanced to the the cecum, identified by                            appendiceal orifice and ileocecal valve. The                            colonoscopy was performed without difficulty. The                            patient tolerated the procedure well. The quality                            of the bowel preparation was good. The ileocecal                            valve, appendiceal orifice, and rectum were                            photographed. Scope In: 10:00:09 AM Scope Out: 10:18:02 AM Scope Withdrawal Time: 0 hours 15 minutes 2 seconds  Total Procedure Duration: 0 hours 17 minutes 53 seconds  Findings:                 The perianal and digital rectal examinations were                            normal.                           A 3 mm polyp was found in the transverse colon. The                            polyp was flat. The polyp was removed with a cold                            snare. Resection and retrieval were complete.  Multiple small-mouthed diverticula were found in                            the sigmoid colon.                           Internal hemorrhoids were found during                            retroflexion. The hemorrhoids were small.                           The exam was otherwise without abnormality. Complications:            No immediate complications. Estimated blood loss:                            Minimal. Estimated Blood Loss:     Estimated blood loss was minimal. Impression:               - One 3 mm polyp in the transverse colon, removed                            with a cold snare. Resected and retrieved.                           - Diverticulosis in the sigmoid colon.                           - Internal hemorrhoids.                           - The examination was otherwise normal. Recommendation:           - Patient has a contact number available for                             emergencies. The signs and symptoms of potential                            delayed complications were discussed with the                            patient. Return to normal activities tomorrow.                            Written discharge instructions were provided to the                            patient.                           - Resume previous diet.                           - Continue present medications.                           -  Await pathology results. Anticipate repeat                            colonoscopy in 5 years given history of malignant                            polyp in the past Harli Engelken P. Beronica Lansdale, MD 08/30/2024 10:23:31 AM This report has been signed electronically.

## 2024-08-30 NOTE — Progress Notes (Signed)
 Pt's states no medical or surgical changes since previsit or office visit.

## 2024-08-30 NOTE — Progress Notes (Signed)
 Margaret Green History and Physical   Primary Care Physician:  Geofm Glade PARAS, MD   Reason for Procedure:   History of polyps, history of malignant colon polyps  Plan:    colonoscopy     HPI: Margaret Green is a 63 y.o. female  here for colonoscopy surveillance - history of malignant polyp removed 2016, has been undergoing surveillance, no resection. Last exam 2020.   Patient denies any bowel symptoms at this time. Otherwise feels well without any cardiopulmonary symptoms.   I have discussed risks / benefits of anesthesia and endoscopic procedure with [name] [place] and they wish to proceed with the exams as outlined today.   The patient was provided an opportunity to ask questions and all were answered. The patient agreed with the plan.    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      Family history of diabetes mellitus    GERD (gastroesophageal reflux disease)    Headache(784.0)    Heart murmur    past hx of heart murmur   Hyperlipidemia    Hypertension    Hypothyroidism    IBS (irritable bowel syndrome)    Low vitamin B12 level    Neutropenia    Raynaud's syndrome    Sjogrens syndrome    Stricture and stenosis of esophagus     Past Surgical History:  Procedure Laterality Date   ABDOMINAL HYSTERECTOMY     BREAST BIOPSY     benign   BREAST REDUCTION SURGERY     COLONOSCOPY  08/19/2016   HERNIA REPAIR     POLYPECTOMY     REDUCTION MAMMAPLASTY     UPPER GASTROINTESTINAL ENDOSCOPY      Prior to Admission medications  Medication Sig Start Date End Date Taking? Authorizing Provider  alendronate  (FOSAMAX ) 70 MG tablet Take 1 tablet (70 mg total) by mouth every 7 (seven) days. Take with a full glass of water on an empty stomach. 04/05/24   Geofm Glade PARAS, MD  Azelastine -Fluticasone   137-50 MCG/ACT SUSP Place 2 sprays into both nostrils daily. Patient taking differently: Place 2 sprays into both nostrils as needed. 12/29/20     busPIRone  (BUSPAR ) 5 MG tablet Take 1 tablet (5 mg total) by mouth 3 (three) times daily as needed. 04/29/24   Geofm Glade PARAS, MD  Calcium  Carb-Cholecalciferol 600-10 MG-MCG TABS Take 1 tablet by mouth daily.    [provider]  clotrimazole  (CLOTRIMAZOLE  ANTI-FUNGAL) 1 % cream Apply 1 application topically 2 (two) times daily. Patient not taking: Reported on 08/20/2024 02/26/20   Geofm Glade PARAS, MD  diphenoxylate -atropine  (LOMOTIL ) 2.5-0.025 MG tablet Take 1 tablet by mouth 4 (four) times daily as needed for diarrhea or loose stools. 04/29/24   Geofm Glade PARAS, MD  escitalopram  (LEXAPRO ) 20 MG tablet Take 1 tablet (20 mg total) by mouth daily. 07/21/24   Geofm Glade PARAS, MD  estradiol  (DOTTI ) 0.05 MG/24HR patch PLACE 1 PATCH (0.05 MG TOTAL) ONTO THE SKIN 2 (TWO) TIMES A WEEK - WEDNESDAY AND SUNDAY. 08/24/23   Geofm Glade PARAS, MD  fluticasone  (FLONASE ) 50 MCG/ACT nasal spray INSTILL 2 SPRAYS INTO BOTH NOSTRILS DAILY. Patient taking differently: Place 2 sprays into both nostrils as needed. 05/04/20 08/20/24  Geofm Glade PARAS, MD  ibuprofen  (ADVIL ) 600 MG tablet Take 1 tablet (600 mg  total) by mouth every 8 (eight) hours as needed for moderate pain. Take with food 08/08/22   Geofm Glade PARAS, MD  levothyroxine  (SYNTHROID ) 50 MCG tablet TAKE ONE TABLET BY MOUTH DAILY 03/19/24   Geofm Glade PARAS, MD  losartan  (COZAAR ) 100 MG tablet Take 1 tablet (100 mg total) by mouth daily. 01/23/24   Geofm Glade PARAS, MD  meloxicam  (MOBIC ) 15 MG tablet Take 1 tablet (15 mg total) by mouth daily. 12/21/22   Leonce Katz, DO  Rimegepant Sulfate (NURTEC) 75 MG TBDP Take 1 tablet (75 mg total) by mouth daily as needed. 04/16/24   Geofm Glade PARAS, MD  rosuvastatin  (CRESTOR ) 20 MG tablet Take 1 tablet (20 mg total) by mouth daily. 01/23/24   Geofm Glade PARAS, MD  tiZANidine  (ZANAFLEX ) 4 MG  tablet TAKE ONE TABLET BY MOUTH TWICE DAILY AS NEEDED FOR MUSCLE SPASMS 01/08/24   Geofm Glade PARAS, MD  traZODone  (DESYREL ) 50 MG tablet Take 1.5 tablets (75 mg total) by mouth at bedtime. 02/12/24   Geofm Glade PARAS, MD  TYRVAYA 0.03 MG/ACT SOLN SMARTSIG:1 Spray(s) Both Nares Twice a Week 08/12/21   [provider]  UNIFINE PENTIPS 31G X 6 MM MISC USE AS DIRECTED WITH SAXENDA  05/22/20   [provider]    Current Outpatient Medications  Medication Sig Dispense Refill   alendronate  (FOSAMAX ) 70 MG tablet Take 1 tablet (70 mg total) by mouth every 7 (seven) days. Take with a full glass of water on an empty stomach. 12 tablet 3   Azelastine -Fluticasone  137-50 MCG/ACT SUSP Place 2 sprays into both nostrils daily. (Patient taking differently: Place 2 sprays into both nostrils as needed.) 23 g 5   busPIRone  (BUSPAR ) 5 MG tablet Take 1 tablet (5 mg total) by mouth 3 (three) times daily as needed. 90 tablet 5   Calcium  Carb-Cholecalciferol 600-10 MG-MCG TABS Take 1 tablet by mouth daily.     clotrimazole  (CLOTRIMAZOLE  ANTI-FUNGAL) 1 % cream Apply 1 application topically 2 (two) times daily. (Patient not taking: Reported on 08/20/2024) 30 g 0   diphenoxylate -atropine  (LOMOTIL ) 2.5-0.025 MG tablet Take 1 tablet by mouth 4 (four) times daily as needed for diarrhea or loose stools. 30 tablet 0   escitalopram  (LEXAPRO ) 20 MG tablet Take 1 tablet (20 mg total) by mouth daily. 90 tablet 1   estradiol  (DOTTI ) 0.05 MG/24HR patch PLACE 1 PATCH (0.05 MG TOTAL) ONTO THE SKIN 2 (TWO) TIMES A WEEK - WEDNESDAY AND SUNDAY. 24 patch 0   fluticasone  (FLONASE ) 50 MCG/ACT nasal spray INSTILL 2 SPRAYS INTO BOTH NOSTRILS DAILY. (Patient taking differently: Place 2 sprays into both nostrils as needed.) 16 g 2   ibuprofen  (ADVIL ) 600 MG tablet Take 1 tablet (600 mg total) by mouth every 8 (eight) hours as needed for moderate pain. Take with food 30 tablet 0   levothyroxine  (SYNTHROID ) 50 MCG tablet TAKE ONE TABLET BY  MOUTH DAILY 90 tablet 1   losartan  (COZAAR ) 100 MG tablet Take 1 tablet (100 mg total) by mouth daily. 90 tablet 2   meloxicam  (MOBIC ) 15 MG tablet Take 1 tablet (15 mg total) by mouth daily. 30 tablet 0   Rimegepant Sulfate (NURTEC) 75 MG TBDP Take 1 tablet (75 mg total) by mouth daily as needed. 8 tablet 3   rosuvastatin  (CRESTOR ) 20 MG tablet Take 1 tablet (20 mg total) by mouth daily. 90 tablet 2   tiZANidine  (ZANAFLEX ) 4 MG tablet TAKE ONE TABLET BY MOUTH TWICE DAILY AS NEEDED FOR MUSCLE SPASMS 180  tablet 1   traZODone  (DESYREL ) 50 MG tablet Take 1.5 tablets (75 mg total) by mouth at bedtime. 135 tablet 1   TYRVAYA 0.03 MG/ACT SOLN SMARTSIG:1 Spray(s) Both Nares Twice a Week     UNIFINE PENTIPS 31G X 6 MM MISC USE AS DIRECTED WITH SAXENDA      Current Facility-Administered Medications  Medication Dose Route Frequency Provider Last Rate Last Admin   0.9 %  sodium chloride  infusion  500 mL Intravenous Once Amalee Olsen, Elspeth SQUIBB, MD        Allergies as of 08/30/2024 - Review Complete 08/30/2024  Allergen Reaction Noted   Abilify [aripiprazole]  06/01/2017   Codeine Nausea And Vomiting    Omeprazole  Hives 04/30/2015   Albuterol  Other (See Comments) 10/04/2021   Meloxicam  Itching and Rash 03/14/2007   Oxycodone-aspirin  Itching and Rash 03/14/2007   Sulfamethoxazole Itching and Rash 03/14/2007    Family History  Problem Relation Age of Onset   Coronary artery disease Father        age 58 - 4 stents, all 3 siblings with early cardiac disease   Colon polyps Father    Heart attack Father 7   Heart disease Father    Cirrhosis Mother        alcohol related   Alcohol abuse Mother    Ovarian cancer Maternal Aunt    Stomach cancer Paternal Aunt    Esophageal cancer Paternal Aunt    Diabetes Paternal Grandmother        and aunt   Colon polyps Sister    Heart attack Paternal Uncle    Colon cancer Neg Hx    Rectal cancer Neg Hx     Social History   Socioeconomic History   Marital  status: Single    Spouse name: Not on file   Number of children: Not on file   Years of education: Not on file   Highest education level: Some college, no degree  Occupational History   Not on file  Tobacco Use   Smoking status: Never   Smokeless tobacco: Never  Vaping Use   Vaping status: Never Used  Substance and Sexual Activity   Alcohol use: Yes    Alcohol/week: 0.0 standard drinks of alcohol    Comment: occasionally   Drug use: No   Sexual activity: Not Currently    Comment: 1st intercourse- 21, partners- 3,   Other Topics Concern   Not on file  Social History Narrative   Partner for 2 years in 2013. Prior 20 year relationship-still friends. No children. 1 dog-peekapoo.       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       Hobbies: golf, cooking, gardening, poker   Social Drivers of Health   Tobacco Use: Low Risk (08/30/2024)   Patient History    Smoking Tobacco Use: Never    Smokeless Tobacco Use: Never    Passive Exposure: Not on file  Financial Resource Strain: Medium Risk (04/05/2024)   Overall Financial Resource Strain (CARDIA)    Difficulty of Paying Living Expenses: Somewhat hard  Food Insecurity: No Food Insecurity (04/05/2024)   Epic    Worried About Programme Researcher, Broadcasting/film/video in the Last Year: Never true    Ran Out of Food in the Last Year: Never true  Transportation Needs: No Transportation Needs (04/05/2024)   Epic    Lack of Transportation (Medical): No    Lack of Transportation (Non-Medical): No  Physical Activity:  Inactive (04/05/2024)   Exercise Vital Sign    Days of Exercise per Week: 0 days    Minutes of Exercise per Session: Not on file  Stress: Stress Concern Present (04/05/2024)   [employer] of Occupational Health - Occupational Stress Questionnaire    Feeling of Stress: To some extent  Social Connections: Unknown (04/05/2024)   Social Connection and Isolation Panel    Frequency  of Communication with Friends and Family: More than three times a week    Frequency of Social Gatherings with Friends and Family: More than three times a week    Attends Religious Services: Patient declined    Active Member of Clubs or Organizations: Patient declined    Attends Banker Meetings: Not on file    Marital Status: Never married  Intimate Partner Violence: Not on file  Depression (PHQ2-9): Low Risk (04/05/2024)   Depression (PHQ2-9)    PHQ-2 Score: 0  Alcohol Screen: Low Risk (04/05/2024)   Alcohol Screen    Last Alcohol Screening Score (AUDIT): 3  Housing: Unknown (04/05/2024)   Epic    Unable to Pay for Housing in the Last Year: No    Number of Times Moved in the Last Year: Not on file    Homeless in the Last Year: No  Utilities: Not on file  Health Literacy: Not on file    Review of Systems: All other review of systems negative except as mentioned in the HPI.  Physical Exam: Vital signs BP 126/73   Pulse 76   Temp (!) 97.5 F (36.4 C) (Skin)   Ht 5' 3 (1.6 m)   Wt 185 lb (83.9 kg)   SpO2 96%   BMI 32.77 kg/m   General:   Alert,  Well-developed, pleasant and cooperative in NAD Lungs:  Clear throughout to auscultation.   Heart:  Regular rate and rhythm Abdomen:  Soft, nontender and nondistended.   Neuro/Psych:  Alert and cooperative. Normal mood and affect. A and O x 3  Marcey Naval, MD [facility]

## 2024-08-30 NOTE — Patient Instructions (Signed)
 Resume previous diet.  Continue present medications.  Await pathology results.   Anticipate repeat colonoscopy in 5 years given history of polyps.   YOU HAD AN ENDOSCOPIC PROCEDURE TODAY AT THE [facility]:   Refer to the procedure report that was given to you for any specific questions about what was found during the examination.  If the procedure report does not answer your questions, please call your gastroenterologist to clarify.  If you requested that your care partner not be given the details of your procedure findings, then the procedure report has been included in a sealed envelope for you to review at your convenience later.  YOU SHOULD EXPECT: Some feelings of bloating in the abdomen. Passage of more gas than usual.  Walking can help get rid of the air that was put into your GI tract during the procedure and reduce the bloating. If you had a lower endoscopy (such as a colonoscopy or flexible sigmoidoscopy) you may notice spotting of blood in your stool or on the toilet paper. If you underwent a bowel prep for your procedure, you may not have a normal bowel movement for a few days.  Please Note:  You might notice some irritation and congestion in your nose or some drainage.  This is from the oxygen used during your procedure.  There is no need for concern and it should clear up in a day or so.  SYMPTOMS TO REPORT IMMEDIATELY:  Following lower endoscopy (colonoscopy or flexible sigmoidoscopy):  Excessive amounts of blood in the stool  Significant tenderness or worsening of abdominal pains  Swelling of the abdomen that is new, acute  Fever of 100F or higher  For urgent or emergent issues, a gastroenterologist can be reached at any hour by calling [phone]. Do not use MyChart messaging for urgent concerns.    DIET:  We do recommend a small meal at first, but then you may proceed to your regular diet.  Drink plenty of fluids but you should avoid alcoholic beverages  for 24 hours.  ACTIVITY:  You should plan to take it easy for the rest of today and you should NOT DRIVE or use heavy machinery until tomorrow (because of the sedation medicines used during the test).    FOLLOW UP: Our staff will call the number listed on your records the next business day following your procedure.  We will call around 7:15- 8:00 am to check on you and address any questions or concerns that you may have regarding the information given to you following your procedure. If we do not reach you, we will leave a message.     If any biopsies were taken you will be contacted by phone or by letter within the next 1-3 weeks.  Please call us  at [phone] if you have not heard about the biopsies in 3 weeks.    SIGNATURES/CONFIDENTIALITY: You and/or your care partner have signed paperwork which will be entered into your electronic medical record.  These signatures attest to the fact that that the information above on your After Visit Summary has been reviewed and is understood.  Full responsibility of the confidentiality of this discharge information lies with you and/or your care-partner.

## 2024-08-30 NOTE — Progress Notes (Signed)
 Transferred to PACU via stretcher. Patient arousing to stimulation.  VSS upon leaving procedure room.

## 2024-09-02 ENCOUNTER — Telehealth: Payer: Self-pay

## 2024-09-02 NOTE — Telephone Encounter (Signed)
 Follow up call to pt, no answer.

## 2024-09-03 ENCOUNTER — Ambulatory Visit: Payer: Self-pay | Admitting: Gastroenterology

## 2024-09-03 LAB — SURGICAL PATHOLOGY

## 2024-09-16 ENCOUNTER — Encounter: Payer: Self-pay | Admitting: Internal Medicine

## 2024-09-28 ENCOUNTER — Other Ambulatory Visit (HOSPITAL_COMMUNITY): Payer: Self-pay

## 2024-09-28 MED ORDER — WEGOVY 1.5 MG PO TABS
1.5000 mg | ORAL_TABLET | Freq: Every day | ORAL | 0 refills | Status: AC
  Filled 2024-09-28: qty 30, 30d supply, fill #0

## 2024-09-28 NOTE — Addendum Note (Signed)
 Addended by: GEOFM GLADE PARAS on: 09/28/2024 10:38 AM   Modules accepted: Orders

## 2024-09-30 ENCOUNTER — Other Ambulatory Visit (HOSPITAL_COMMUNITY): Payer: Self-pay

## 2024-10-06 ENCOUNTER — Encounter: Payer: Self-pay | Admitting: Internal Medicine

## 2024-10-06 NOTE — Progress Notes (Unsigned)
 "   Subjective:    Patient ID: Margaret Green, female    DOB: 08/21/1961, 64 y.o.   MRN: [id number]      HPI Margaret Green is here for a Physical exam and her chronic medical problems.      Medications and allergies reviewed with patient and updated if appropriate.  Medications Ordered Prior to Encounter[1]  Review of Systems     Objective:  There were no vitals filed for this visit. There were no vitals filed for this visit. There is no height or weight on file to calculate BMI.  BP Readings from Last 3 Encounters:  08/30/24 (!) 145/78  04/29/24 124/78  04/05/24 132/76    Wt Readings from Last 3 Encounters:  08/30/24 185 lb (83.9 kg)  08/20/24 185 lb (83.9 kg)  04/29/24 189 lb (85.7 kg)       Physical Exam Constitutional: She appears well-developed and well-nourished. No distress.  HENT:  Head: Normocephalic and atraumatic.  Right Ear: External ear normal. Normal ear canal and TM Left Ear: External ear normal.  Normal ear canal and TM Mouth/Throat: Oropharynx is clear and moist.  Eyes: Conjunctivae normal.  Neck: Neck supple. No tracheal deviation present. No thyromegaly present.  No carotid bruit  Cardiovascular: Normal rate, regular rhythm and normal heart sounds.   No murmur heard.  No edema. Pulmonary/Chest: Effort normal and breath sounds normal. No respiratory distress. She has no wheezes. She has no rales.  Breast: deferred   Abdominal: Soft. She exhibits no distension. There is no tenderness.  Lymphadenopathy: She has no cervical adenopathy.  Skin: Skin is warm and dry. She is not diaphoretic.  Psychiatric: She has a normal mood and affect. Her behavior is normal.     Lab Results  Component Value Date   WBC 5.0 04/05/2024   HGB 12.8 04/05/2024   HCT 38.2 04/05/2024   PLT 324.0 04/05/2024   GLUCOSE 91 04/05/2024   CHOL 199 04/05/2024   TRIG 166.0 (H) 04/05/2024   HDL 60.50 04/05/2024   LDLDIRECT 103.0 02/05/2019   LDLCALC 105 (H)  04/05/2024   ALT 20 04/05/2024   AST 20 04/05/2024   NA 140 04/05/2024   K 3.8 04/05/2024   CL 106 04/05/2024   CREATININE 0.80 04/05/2024   BUN 14 04/05/2024   CO2 28 04/05/2024   TSH 3.84 04/05/2024   HGBA1C 5.9 04/05/2024         Assessment & Plan:   Physical exam: Screening blood work  ordered Exercise   Weight   Substance abuse  none   Reviewed recommended immunizations.   Health Maintenance  Topic Date Due   COVID-19 Vaccine (1) Never done   Zoster Vaccines- Shingrix (1 of 2) Never done   Mammogram  10/05/2020   Pneumococcal Vaccine: 50+ Years (2 of 2 - PCV) 08/11/2021   Influenza Vaccine  04/12/2024   Bone Density Scan  11/07/2025   DTaP/Tdap/Td (3 - Td or Tdap) 06/21/2027   Colonoscopy  08/30/2029   HPV VACCINES (No Doses Required) Completed   Hepatitis C Screening  Completed   HIV Screening  Completed   Hepatitis B Vaccines 19-59 Average Risk  Aged Out   Meningococcal B Vaccine  Aged Out          See Problem List for Assessment and Plan of chronic medical problems.        [1]  Current Outpatient Medications on File Prior to Visit  Medication Sig Dispense Refill   alendronate  (FOSAMAX ) 70  MG tablet Take 1 tablet (70 mg total) by mouth every 7 (seven) days. Take with a full glass of water on an empty stomach. 12 tablet 3   Azelastine -Fluticasone  137-50 MCG/ACT SUSP Place 2 sprays into both nostrils daily. (Patient taking differently: Place 2 sprays into both nostrils as needed.) 23 g 5   busPIRone  (BUSPAR ) 5 MG tablet Take 1 tablet (5 mg total) by mouth 3 (three) times daily as needed. 90 tablet 5   Calcium  Carb-Cholecalciferol 600-10 MG-MCG TABS Take 1 tablet by mouth daily.     clotrimazole  (CLOTRIMAZOLE  ANTI-FUNGAL) 1 % cream Apply 1 application topically 2 (two) times daily. (Patient not taking: Reported on 08/20/2024) 30 g 0   diphenoxylate -atropine  (LOMOTIL ) 2.5-0.025 MG tablet Take 1 tablet by mouth 4 (four) times daily as needed for  diarrhea or loose stools. 30 tablet 0   escitalopram  (LEXAPRO ) 20 MG tablet Take 1 tablet (20 mg total) by mouth daily. 90 tablet 1   estradiol  (DOTTI ) 0.05 MG/24HR patch PLACE 1 PATCH (0.05 MG TOTAL) ONTO THE SKIN 2 (TWO) TIMES A WEEK - WEDNESDAY AND SUNDAY. 24 patch 0   fluticasone  (FLONASE ) 50 MCG/ACT nasal spray INSTILL 2 SPRAYS INTO BOTH NOSTRILS DAILY. (Patient taking differently: Place 2 sprays into both nostrils as needed.) 16 g 2   ibuprofen  (ADVIL ) 600 MG tablet Take 1 tablet (600 mg total) by mouth every 8 (eight) hours as needed for moderate pain. Take with food 30 tablet 0   levothyroxine  (SYNTHROID ) 50 MCG tablet TAKE ONE TABLET BY MOUTH DAILY 90 tablet 1   losartan  (COZAAR ) 100 MG tablet Take 1 tablet (100 mg total) by mouth daily. 90 tablet 2   meloxicam  (MOBIC ) 15 MG tablet Take 1 tablet (15 mg total) by mouth daily. 30 tablet 0   Rimegepant Sulfate (NURTEC) 75 MG TBDP Take 1 tablet (75 mg total) by mouth daily as needed. 8 tablet 3   rosuvastatin  (CRESTOR ) 20 MG tablet Take 1 tablet (20 mg total) by mouth daily. 90 tablet 2   semaglutide -weight management (WEGOVY ) 1.5 MG tablet Take 1 tablet (1.5 mg total) by mouth daily. Daily in AM on an empty stomach with 4 oz of water. Do not eat or drink for 30 minutes after dose. 30 tablet 0   tiZANidine  (ZANAFLEX ) 4 MG tablet TAKE ONE TABLET BY MOUTH TWICE DAILY AS NEEDED FOR MUSCLE SPASMS 180 tablet 1   traZODone  (DESYREL ) 50 MG tablet Take 1.5 tablets (75 mg total) by mouth at bedtime. 135 tablet 1   TYRVAYA 0.03 MG/ACT SOLN SMARTSIG:1 Spray(s) Both Nares Twice a Week (Patient not taking: Reported on 08/30/2024)     UNIFINE PENTIPS 31G X 6 MM MISC USE AS DIRECTED WITH SAXENDA  (Patient not taking: Reported on 08/30/2024)     No current facility-administered medications on file prior to visit.   "

## 2024-10-06 NOTE — Patient Instructions (Addendum)
 "     Blood work was ordered.       Medications changes include :   None    A referral was ordered and someone will call you to schedule an appointment.     Return in about 6 months (around 04/08/2025) for follow up.    Health Maintenance, Female Adopting a healthy lifestyle and getting preventive care are important in promoting health and wellness. Ask your health care provider about: The right schedule for you to have regular tests and exams. Things you can do on your own to prevent diseases and keep yourself healthy. What should I know about diet, weight, and exercise? Eat a healthy diet  Eat a diet that includes plenty of vegetables, fruits, low-fat dairy products, and lean protein. Do not eat a lot of foods that are high in solid fats, added sugars, or sodium. Maintain a healthy weight Body mass index (BMI) is used to identify weight problems. It estimates body fat based on height and weight. Your health care provider can help determine your BMI and help you achieve or maintain a healthy weight. Get regular exercise Get regular exercise. This is one of the most important things you can do for your health. Most adults should: Exercise for at least 150 minutes each week. The exercise should increase your heart rate and make you sweat (moderate-intensity exercise). Do strengthening exercises at least twice a week. This is in addition to the moderate-intensity exercise. Spend less time sitting. Even light physical activity can be beneficial. Watch cholesterol and blood lipids Have your blood tested for lipids and cholesterol at 63 years of age, then have this test every 5 years. Have your cholesterol levels checked more often if: Your lipid or cholesterol levels are high. You are older than 63 years of age. You are at high risk for heart disease. What should I know about cancer screening? Depending on your health history and family history, you may need to have cancer  screening at various ages. This may include screening for: Breast cancer. Cervical cancer. Colorectal cancer. Skin cancer. Lung cancer. What should I know about heart disease, diabetes, and high blood pressure? Blood pressure and heart disease High blood pressure causes heart disease and increases the risk of stroke. This is more likely to develop in people who have high blood pressure readings or are overweight. Have your blood pressure checked: Every 3-5 years if you are 45-65 years of age. Every year if you are 74 years old or older. Diabetes Have regular diabetes screenings. This checks your fasting blood sugar level. Have the screening done: Once every three years after age 21 if you are at a normal weight and have a low risk for diabetes. More often and at a younger age if you are overweight or have a high risk for diabetes. What should I know about preventing infection? Hepatitis B If you have a higher risk for hepatitis B, you should be screened for this virus. Talk with your health care provider to find out if you are at risk for hepatitis B infection. Hepatitis C Testing is recommended for: Everyone born from [birth year] through 1964. Anyone with known risk factors for hepatitis C. Sexually transmitted infections (STIs) Get screened for STIs, including gonorrhea and chlamydia, if: You are sexually active and are younger than 63 years of age. You are older than 63 years of age and your health care provider tells you that you are at risk for this type of infection. Your sexual  activity has changed since you were last screened, and you are at increased risk for chlamydia or gonorrhea. Ask your health care provider if you are at risk. Ask your health care provider about whether you are at high risk for HIV. Your health care provider may recommend a prescription medicine to help prevent HIV infection. If you choose to take medicine to prevent HIV, you should first get tested for HIV. You  should then be tested every 3 months for as long as you are taking the medicine. Pregnancy If you are about to stop having your period (premenopausal) and you may become pregnant, seek counseling before you get pregnant. Take 400 to 800 micrograms (mcg) of folic acid every day if you become pregnant. Ask for birth control (contraception) if you want to prevent pregnancy. Osteoporosis and menopause Osteoporosis is a disease in which the bones lose minerals and strength with aging. This can result in bone fractures. If you are 52 years old or older, or if you are at risk for osteoporosis and fractures, ask your health care provider if you should: Be screened for bone loss. Take a calcium  or vitamin D  supplement to lower your risk of fractures. Be given hormone replacement therapy (HRT) to treat symptoms of menopause. Follow these instructions at home: Alcohol use Do not drink alcohol if: Your health care provider tells you not to drink. You are pregnant, may be pregnant, or are planning to become pregnant. If you drink alcohol: Limit how much you have to: 0-1 drink a day. Know how much alcohol is in your drink. In the [place] one drink equals one 12 oz bottle of beer (355 mL), one 5 oz glass of wine (148 mL), or one 1 oz glass of hard liquor (44 mL). Lifestyle Do not use any products that contain nicotine or tobacco. These products include cigarettes, chewing tobacco, and vaping devices, such as e-cigarettes. If you need help quitting, ask your health care provider. Do not use street drugs. Do not share needles. Ask your health care provider for help if you need support or information about quitting drugs. General instructions Schedule regular health, dental, and eye exams. Stay current with your vaccines. Tell your health care provider if: You often feel depressed. You have ever been abused or do not feel safe at home. Summary Adopting a healthy lifestyle and getting preventive care are  important in promoting health and wellness. Follow your health care provider's instructions about healthy diet, exercising, and getting tested or screened for diseases. Follow your health care provider's instructions on monitoring your cholesterol and blood pressure. This information is not intended to replace advice given to you by your health care provider. Make sure you discuss any questions you have with your health care provider. Document Revised: 01/18/2021 Document Reviewed: 01/18/2021 Elsevier Patient Education  2024 [employer]. "

## 2024-10-09 ENCOUNTER — Ambulatory Visit: Admitting: Internal Medicine

## 2024-10-09 DIAGNOSIS — M3500 Sicca syndrome, unspecified: Secondary | ICD-10-CM

## 2024-10-09 DIAGNOSIS — I10 Essential (primary) hypertension: Secondary | ICD-10-CM

## 2024-10-09 DIAGNOSIS — E66811 Obesity, class 1: Secondary | ICD-10-CM

## 2024-10-09 DIAGNOSIS — F411 Generalized anxiety disorder: Secondary | ICD-10-CM

## 2024-10-09 DIAGNOSIS — M797 Fibromyalgia: Secondary | ICD-10-CM

## 2024-10-09 DIAGNOSIS — M810 Age-related osteoporosis without current pathological fracture: Secondary | ICD-10-CM

## 2024-10-09 DIAGNOSIS — F3289 Other specified depressive episodes: Secondary | ICD-10-CM

## 2024-10-09 DIAGNOSIS — E538 Deficiency of other specified B group vitamins: Secondary | ICD-10-CM

## 2024-10-09 DIAGNOSIS — E559 Vitamin D deficiency, unspecified: Secondary | ICD-10-CM

## 2024-10-09 DIAGNOSIS — E039 Hypothyroidism, unspecified: Secondary | ICD-10-CM

## 2024-10-09 DIAGNOSIS — Z79890 Hormone replacement therapy (postmenopausal): Secondary | ICD-10-CM

## 2024-10-09 DIAGNOSIS — E7800 Pure hypercholesterolemia, unspecified: Secondary | ICD-10-CM

## 2024-10-09 DIAGNOSIS — G479 Sleep disorder, unspecified: Secondary | ICD-10-CM

## 2024-10-09 DIAGNOSIS — R7303 Prediabetes: Secondary | ICD-10-CM

## 2024-10-09 DIAGNOSIS — Z0000 Encounter for general adult medical examination without abnormal findings: Secondary | ICD-10-CM

## 2024-10-21 ENCOUNTER — Ambulatory Visit: Admitting: Internal Medicine

## 2025-01-29 ENCOUNTER — Encounter: Admitting: Internal Medicine
